# Patient Record
Sex: Female | Born: 1955
Health system: Southern US, Community
[De-identification: ages and names within clinical notes are randomized; demographics above are authoritative.]

## PROBLEM LIST (undated history)

## (undated) DIAGNOSIS — E119 Type 2 diabetes mellitus without complications: Secondary | ICD-10-CM

## (undated) DIAGNOSIS — I700 Atherosclerosis of aorta: Secondary | ICD-10-CM

## (undated) DIAGNOSIS — Z720 Tobacco use: Secondary | ICD-10-CM

## (undated) DIAGNOSIS — R002 Palpitations: Secondary | ICD-10-CM

## (undated) DIAGNOSIS — I10 Essential (primary) hypertension: Secondary | ICD-10-CM

## (undated) DIAGNOSIS — J9611 Chronic respiratory failure with hypoxia: Secondary | ICD-10-CM

## (undated) DIAGNOSIS — M4808 Spinal stenosis, sacral and sacrococcygeal region: Secondary | ICD-10-CM

## (undated) DIAGNOSIS — D649 Anemia, unspecified: Secondary | ICD-10-CM

## (undated) DIAGNOSIS — E559 Vitamin D deficiency, unspecified: Secondary | ICD-10-CM

## (undated) DIAGNOSIS — J449 Chronic obstructive pulmonary disease, unspecified: Secondary | ICD-10-CM

## (undated) DIAGNOSIS — Z794 Long term (current) use of insulin: Secondary | ICD-10-CM

## (undated) DIAGNOSIS — E785 Hyperlipidemia, unspecified: Secondary | ICD-10-CM

## (undated) DIAGNOSIS — E079 Disorder of thyroid, unspecified: Secondary | ICD-10-CM

## (undated) DIAGNOSIS — Z79891 Long term (current) use of opiate analgesic: Secondary | ICD-10-CM

## (undated) DIAGNOSIS — I5030 Unspecified diastolic (congestive) heart failure: Secondary | ICD-10-CM

## (undated) DIAGNOSIS — M4807 Spinal stenosis, lumbosacral region: Secondary | ICD-10-CM

## (undated) DIAGNOSIS — K219 Gastro-esophageal reflux disease without esophagitis: Secondary | ICD-10-CM

## (undated) DIAGNOSIS — M1990 Unspecified osteoarthritis, unspecified site: Secondary | ICD-10-CM

## (undated) DIAGNOSIS — E039 Hypothyroidism, unspecified: Secondary | ICD-10-CM

## (undated) DIAGNOSIS — M48061 Spinal stenosis, lumbar region without neurogenic claudication: Secondary | ICD-10-CM

## (undated) DIAGNOSIS — F419 Anxiety disorder, unspecified: Secondary | ICD-10-CM

## (undated) DIAGNOSIS — M791 Myalgia, unspecified site: Secondary | ICD-10-CM

## (undated) DIAGNOSIS — R202 Paresthesia of skin: Secondary | ICD-10-CM

## (undated) DIAGNOSIS — I509 Heart failure, unspecified: Secondary | ICD-10-CM

## (undated) DIAGNOSIS — E220 Acromegaly and pituitary gigantism: Secondary | ICD-10-CM

## (undated) DIAGNOSIS — T466X5A Adverse effect of antihyperlipidemic and antiarteriosclerotic drugs, initial encounter: Secondary | ICD-10-CM

## (undated) DIAGNOSIS — D352 Benign neoplasm of pituitary gland: Secondary | ICD-10-CM

## (undated) HISTORY — PX: CHOLECYSTECTOMY: SHX55

---

## 1978-11-15 HISTORY — PX: TIBIA FRACTURE SURGERY: SHX806

## 1981-11-15 HISTORY — PX: OVARIAN CYST SURGERY: SHX726

## 1987-11-16 HISTORY — PX: ABDOMINAL HYSTERECTOMY: SHX81

## 2000-11-15 HISTORY — PX: NECK SURGERY: SHX720

## 2001-06-26 ENCOUNTER — Ambulatory Visit (HOSPITAL_COMMUNITY): Admission: RE | Admit: 2001-06-26 | Discharge: 2001-06-26 | Payer: Self-pay | Admitting: Neurosurgery

## 2001-06-26 ENCOUNTER — Encounter: Payer: Self-pay | Admitting: Neurosurgery

## 2003-03-01 ENCOUNTER — Ambulatory Visit (HOSPITAL_COMMUNITY): Admission: RE | Admit: 2003-03-01 | Discharge: 2003-03-01 | Payer: Self-pay | Admitting: Neurosurgery

## 2012-10-30 DIAGNOSIS — M79671 Pain in right foot: Secondary | ICD-10-CM | POA: Insufficient documentation

## 2012-10-30 DIAGNOSIS — S9031XA Contusion of right foot, initial encounter: Secondary | ICD-10-CM | POA: Insufficient documentation

## 2013-01-10 DIAGNOSIS — J441 Chronic obstructive pulmonary disease with (acute) exacerbation: Secondary | ICD-10-CM | POA: Diagnosis present

## 2013-02-15 DIAGNOSIS — E079 Disorder of thyroid, unspecified: Secondary | ICD-10-CM

## 2013-02-15 DIAGNOSIS — K219 Gastro-esophageal reflux disease without esophagitis: Secondary | ICD-10-CM | POA: Insufficient documentation

## 2013-02-15 DIAGNOSIS — E039 Hypothyroidism, unspecified: Secondary | ICD-10-CM

## 2013-02-15 DIAGNOSIS — J309 Allergic rhinitis, unspecified: Secondary | ICD-10-CM | POA: Insufficient documentation

## 2013-02-15 DIAGNOSIS — R002 Palpitations: Secondary | ICD-10-CM | POA: Insufficient documentation

## 2013-03-20 DIAGNOSIS — D539 Nutritional anemia, unspecified: Secondary | ICD-10-CM | POA: Insufficient documentation

## 2013-03-20 DIAGNOSIS — M4800 Spinal stenosis, site unspecified: Secondary | ICD-10-CM | POA: Insufficient documentation

## 2013-03-27 DIAGNOSIS — D352 Benign neoplasm of pituitary gland: Secondary | ICD-10-CM | POA: Insufficient documentation

## 2013-03-27 DIAGNOSIS — E781 Pure hyperglyceridemia: Secondary | ICD-10-CM | POA: Insufficient documentation

## 2014-05-31 DIAGNOSIS — R110 Nausea: Secondary | ICD-10-CM | POA: Insufficient documentation

## 2015-12-10 ENCOUNTER — Emergency Department: Payer: Medicare Other

## 2015-12-10 ENCOUNTER — Encounter: Payer: Self-pay | Admitting: *Deleted

## 2015-12-10 ENCOUNTER — Inpatient Hospital Stay
Admission: EM | Admit: 2015-12-10 | Discharge: 2015-12-15 | DRG: 189 | Disposition: A | Discharge status: Home / Self Care | Payer: Medicare Other | Attending: Internal Medicine | Admitting: Internal Medicine

## 2015-12-10 DIAGNOSIS — M549 Dorsalgia, unspecified: Secondary | ICD-10-CM | POA: Diagnosis present

## 2015-12-10 DIAGNOSIS — Z888 Allergy status to other drugs, medicaments and biological substances status: Secondary | ICD-10-CM

## 2015-12-10 DIAGNOSIS — Z716 Tobacco abuse counseling: Secondary | ICD-10-CM

## 2015-12-10 DIAGNOSIS — E039 Hypothyroidism, unspecified: Secondary | ICD-10-CM | POA: Diagnosis present

## 2015-12-10 DIAGNOSIS — F17210 Nicotine dependence, cigarettes, uncomplicated: Secondary | ICD-10-CM | POA: Diagnosis present

## 2015-12-10 DIAGNOSIS — J9601 Acute respiratory failure with hypoxia: Secondary | ICD-10-CM | POA: Diagnosis present

## 2015-12-10 DIAGNOSIS — F329 Major depressive disorder, single episode, unspecified: Secondary | ICD-10-CM | POA: Diagnosis present

## 2015-12-10 DIAGNOSIS — R0602 Shortness of breath: Secondary | ICD-10-CM

## 2015-12-10 DIAGNOSIS — J441 Chronic obstructive pulmonary disease with (acute) exacerbation: Secondary | ICD-10-CM | POA: Diagnosis present

## 2015-12-10 DIAGNOSIS — I10 Essential (primary) hypertension: Secondary | ICD-10-CM | POA: Diagnosis present

## 2015-12-10 DIAGNOSIS — M4806 Spinal stenosis, lumbar region: Secondary | ICD-10-CM | POA: Diagnosis present

## 2015-12-10 DIAGNOSIS — G8929 Other chronic pain: Secondary | ICD-10-CM | POA: Diagnosis present

## 2015-12-10 HISTORY — DX: Spinal stenosis, sacral and sacrococcygeal region: M48.08

## 2015-12-10 HISTORY — DX: Chronic obstructive pulmonary disease, unspecified: J44.9

## 2015-12-10 HISTORY — DX: Disorder of thyroid, unspecified: E07.9

## 2015-12-10 HISTORY — DX: Tobacco use: Z72.0

## 2015-12-10 HISTORY — DX: Spinal stenosis, lumbar region without neurogenic claudication: M48.061

## 2015-12-10 LAB — BASIC METABOLIC PANEL
ANION GAP: 10 (ref 5–15)
BUN: 15 mg/dL (ref 6–20)
CHLORIDE: 101 mmol/L (ref 101–111)
CO2: 29 mmol/L (ref 22–32)
Calcium: 9.6 mg/dL (ref 8.9–10.3)
Creatinine, Ser: 0.96 mg/dL (ref 0.44–1.00)
GFR calc Af Amer: >60 mL/min (ref >60)
GFR calc non Af Amer: >60 mL/min (ref >60)
Glucose, Bld: 89 mg/dL (ref 65–99)
POTASSIUM: 4.2 mmol/L (ref 3.5–5.1)
SODIUM: 140 mmol/L (ref 135–145)

## 2015-12-10 LAB — CBC WITH DIFFERENTIAL/PLATELET
Basophils Absolute: 0.1 10*3/uL (ref 0–0.1)
Basophils Relative: 1 %
Eosinophils Absolute: 0.7 10*3/uL (ref 0–0.7)
Eosinophils Relative: 5 %
HCT: 44.1 % (ref 35.0–47.0)
Hemoglobin: 14.3 g/dL (ref 12.0–16.0)
Lymphocytes Relative: 25 %
Lymphs Abs: 3.1 10*3/uL (ref 1.0–3.6)
MCH: 27.4 pg (ref 26.0–34.0)
MCHC: 32.5 g/dL (ref 32.0–36.0)
MCV: 84.2 fL (ref 80.0–100.0)
Monocytes Absolute: 1 10*3/uL — ABNORMAL HIGH (ref 0.2–0.9)
Monocytes Relative: 8 %
Neutro Abs: 7.6 10*3/uL — ABNORMAL HIGH (ref 1.4–6.5)
Neutrophils Relative %: 61 %
Platelets: 299 10*3/uL (ref 150–440)
RBC: 5.24 MIL/uL — ABNORMAL HIGH (ref 3.80–5.20)
RDW: 14.9 % — ABNORMAL HIGH (ref 11.5–14.5)
WBC: 12.4 10*3/uL — ABNORMAL HIGH (ref 3.6–11.0)

## 2015-12-10 LAB — TROPONIN I

## 2015-12-10 MED ORDER — METHYLPREDNISOLONE SODIUM SUCC 125 MG IJ SOLR: 125.0000 mg | Freq: Once | INTRAMUSCULAR | Status: DC

## 2015-12-10 MED ORDER — ACETAMINOPHEN 650 MG RE SUPP: 650.0000 mg | Freq: Four times a day (QID) | RECTAL | Status: DC | PRN

## 2015-12-10 MED ORDER — HYDROCODONE-ACETAMINOPHEN 5-325 MG PO TABS
1.0000 | ORAL_TABLET | ORAL | Status: DC | PRN
  Administered 2015-12-11: 14:00:00 1 via ORAL
  Administered 2015-12-12: 2 via ORAL
  Administered 2015-12-12: 1 via ORAL
  Administered 2015-12-13 – 2015-12-15 (×5): 2 via ORAL
  Filled 2015-12-10 (×3): qty 2
  Filled 2015-12-10: qty 1
  Filled 2015-12-10 (×2): qty 2
  Filled 2015-12-10: qty 1
  Filled 2015-12-10: qty 2

## 2015-12-10 MED ORDER — OXYCODONE HCL 5 MG PO TABS
10.0000 mg | ORAL_TABLET | Freq: Once | ORAL | Status: AC
  Administered 2015-12-10: 10 mg via ORAL
  Filled 2015-12-10: qty 2

## 2015-12-10 MED ORDER — POLYETHYLENE GLYCOL 3350 17 G PO PACK: 17.0000 g | PACK | Freq: Every day | ORAL | Status: DC | PRN

## 2015-12-10 MED ORDER — METOPROLOL SUCCINATE ER 25 MG PO TB24
25.0000 mg | ORAL_TABLET | Freq: Every day | ORAL | Status: DC
  Administered 2015-12-10 – 2015-12-14 (×5): 25 mg via ORAL
  Filled 2015-12-10 (×5): qty 1

## 2015-12-10 MED ORDER — METHYLPREDNISOLONE SODIUM SUCC 125 MG IJ SOLR
60.0000 mg | INTRAMUSCULAR | Status: DC
  Administered 2015-12-11 (×2): 60 mg via INTRAVENOUS
  Filled 2015-12-10 (×2): qty 2

## 2015-12-10 MED ORDER — IPRATROPIUM-ALBUTEROL 0.5-2.5 (3) MG/3ML IN SOLN
RESPIRATORY_TRACT | Status: AC
  Administered 2015-12-10: 3 mL via RESPIRATORY_TRACT
  Filled 2015-12-10: qty 6

## 2015-12-10 MED ORDER — ONDANSETRON HCL 4 MG PO TABS: 4.0000 mg | ORAL_TABLET | Freq: Four times a day (QID) | ORAL | Status: DC | PRN

## 2015-12-10 MED ORDER — ACETAMINOPHEN 325 MG PO TABS
650.0000 mg | ORAL_TABLET | Freq: Four times a day (QID) | ORAL | Status: DC | PRN
Start: 2015-12-10 — End: 2015-12-15
  Administered 2015-12-10 – 2015-12-12 (×2): 650 mg via ORAL
  Filled 2015-12-10 (×2): qty 2

## 2015-12-10 MED ORDER — LEVOTHYROXINE SODIUM 175 MCG PO TABS
175.0000 ug | ORAL_TABLET | Freq: Every day | ORAL | Status: DC
  Administered 2015-12-11 – 2015-12-15 (×5): 175 ug via ORAL
  Filled 2015-12-10 (×6): qty 1

## 2015-12-10 MED ORDER — SODIUM CHLORIDE 0.9% FLUSH
3.0000 mL | Freq: Two times a day (BID) | INTRAVENOUS | Status: DC
  Administered 2015-12-10 – 2015-12-15 (×10): 3 mL via INTRAVENOUS

## 2015-12-10 MED ORDER — GABAPENTIN 600 MG PO TABS
600.0000 mg | ORAL_TABLET | Freq: Three times a day (TID) | ORAL | Status: DC
  Administered 2015-12-10 – 2015-12-15 (×14): 600 mg via ORAL
  Filled 2015-12-10 (×17): qty 1

## 2015-12-10 MED ORDER — SODIUM CHLORIDE 0.9% FLUSH: 3.0000 mL | INTRAVENOUS | Status: DC | PRN

## 2015-12-10 MED ORDER — IPRATROPIUM-ALBUTEROL 0.5-2.5 (3) MG/3ML IN SOLN
3.0000 mL | Freq: Once | RESPIRATORY_TRACT | Status: AC
  Administered 2015-12-10: 3 mL via RESPIRATORY_TRACT

## 2015-12-10 MED ORDER — IPRATROPIUM-ALBUTEROL 0.5-2.5 (3) MG/3ML IN SOLN
3.0000 mL | RESPIRATORY_TRACT | Status: DC | PRN
  Administered 2015-12-10 – 2015-12-15 (×17): 3 mL via RESPIRATORY_TRACT
  Filled 2015-12-10 (×16): qty 3

## 2015-12-10 MED ORDER — ENOXAPARIN SODIUM 40 MG/0.4ML ~~LOC~~ SOLN
40.0000 mg | SUBCUTANEOUS | Status: DC
  Administered 2015-12-10 – 2015-12-14 (×5): 40 mg via SUBCUTANEOUS
  Filled 2015-12-10 (×5): qty 0.4

## 2015-12-10 MED ORDER — AZITHROMYCIN 500 MG IV SOLR
500.0000 mg | Freq: Once | INTRAVENOUS | Status: AC
  Administered 2015-12-10: 500 mg via INTRAVENOUS
  Filled 2015-12-10: qty 500

## 2015-12-10 MED ORDER — AZITHROMYCIN 250 MG PO TABS
500.0000 mg | ORAL_TABLET | Freq: Every day | ORAL | Status: DC
  Administered 2015-12-11 – 2015-12-15 (×5): 500 mg via ORAL
  Filled 2015-12-10 (×5): qty 2

## 2015-12-10 MED ORDER — MOMETASONE FURO-FORMOTEROL FUM 100-5 MCG/ACT IN AERO
2.0000 | INHALATION_SPRAY | Freq: Two times a day (BID) | RESPIRATORY_TRACT | Status: DC
  Administered 2015-12-10 – 2015-12-15 (×10): 2 via RESPIRATORY_TRACT
  Filled 2015-12-10: qty 8.8

## 2015-12-10 MED ORDER — SODIUM CHLORIDE 0.9 % IV SOLN: 250.0000 mL | INTRAVENOUS | Status: DC | PRN

## 2015-12-10 MED ORDER — BISACODYL 5 MG PO TBEC
5.0000 mg | DELAYED_RELEASE_TABLET | Freq: Every day | ORAL | Status: DC | PRN
  Administered 2015-12-13: 13:00:00 5 mg via ORAL
  Filled 2015-12-10: qty 1

## 2015-12-10 MED ORDER — ONDANSETRON HCL 4 MG/2ML IJ SOLN: 4.0000 mg | Freq: Four times a day (QID) | INTRAMUSCULAR | Status: DC | PRN

## 2015-12-10 NOTE — ED Notes (Signed)
Patient up to bathroom within her room. (+) DOE noted. Patient returned to bed and supplemental oxygen replaced. Patient guided through deep breathing and breathing through her nose. Patient pending transfer to 1C at this time.

## 2015-12-10 NOTE — ED Notes (Signed)
Pt arrived to ED via EMS from home after reporting SOB that began last night. Pt has hx of COPD and reports this feels similar to COPD exacerbations in the past. Pt has audible wheezing at time of arrival and a dry cough present. EMS administered 125 Solu-Medrol and gave pt 1 duoneb treatment.

## 2015-12-10 NOTE — H&P (Addendum)
[place] at [place] NAME: Alison Taylor    MR#:  [id number]  DATE OF BIRTH:  30-Jul-1956  DATE OF ADMISSION:  12/10/2015  PRIMARY CARE PHYSICIAN: No primary care provider on file.   REQUESTING/REFERRING PHYSICIAN: Dr. Edd Fabian  CHIEF COMPLAINT:   Chief Complaint  Patient presents with  . Shortness of Breath    HISTORY OF PRESENT ILLNESS:  Alison Taylor  is a 60 y.o. female with a known history of COPD, hypothyroidism here with SOB/wheezing/cough. Multiple nebs and steroids tried but still hypoxic at 875 on RA and wheezing. Clear sputum. No edema. Continues to smoke. Afebrile. CXR showed no PNA. Last hospital admission for COPD was Summer 2016. Has chronic back pain.  PAST MEDICAL HISTORY:   Past Medical History  Diagnosis Date  . COPD (chronic obstructive pulmonary disease) ([place])   . Lumbar stenosis   . Spinal stenosis of sacral region   . Thyroid disease     hypo  . Tobacco abuse     PAST SURGICAL HISTORY:  History reviewed. No pertinent past surgical history.  SOCIAL HISTORY:   Social History  Substance Use Topics  . Smoking status: Current Every Day Smoker -- 0.50 packs/day    Types: Cigarettes  . Smokeless tobacco: Not on file  . Alcohol Use: No    FAMILY HISTORY:   Family History  Problem Relation Age of Onset  . ALS Brother     DRUG ALLERGIES:   Allergies  Allergen Reactions  . Keflet [Cephalexin] Hives    REVIEW OF SYSTEMS:   Review of Systems  Constitutional: Positive for malaise/fatigue. Negative for fever, chills and weight loss.  HENT: Negative for hearing loss and nosebleeds.   Eyes: Negative for blurred vision, double vision and pain.  Respiratory: Positive for cough, sputum production, shortness of breath and wheezing. Negative for hemoptysis.   Cardiovascular: Negative for chest pain, palpitations, orthopnea and leg swelling.  Gastrointestinal: Positive for nausea. Negative for  vomiting, abdominal pain, diarrhea and constipation.  Genitourinary: Negative for dysuria and hematuria.  Musculoskeletal: Positive for back pain and neck pain. Negative for myalgias and falls.  Skin: Negative for rash.  Neurological: Positive for weakness. Negative for dizziness, tremors, sensory change, speech change, focal weakness, seizures and headaches.  Endo/Heme/Allergies: Does not bruise/bleed easily.  Psychiatric/Behavioral: Negative for depression and memory loss. The patient is not nervous/anxious.      MEDICATIONS AT HOME:   Prior to Admission medications   Not on File    VITAL SIGNS:  Blood pressure 125/85, pulse 92, resp. rate 21, height 5\' 11"  (1.803 m), weight 88.451 kg (195 lb), last menstrual period 12/10/1987, SpO2 87 %.  PHYSICAL EXAMINATION:  Physical Exam  GENERAL:  60 y.o.-year-old patient lying in the bed with  Acute resp distress.  EYES: Pupils equal, round, reactive to light and accommodation. No scleral icterus. Extraocular muscles intact.  HEENT: Head atraumatic, normocephalic. Oropharynx and nasopharynx clear. No oropharyngeal erythema, moist oral mucosa  NECK:  Supple, no jugular venous distention. No thyroid enlargement, no tenderness.  LUNGS: Increased work of breathing. Bilateral wheezing. Poor air entry CARDIOVASCULAR: S1, S2 normal. No murmurs, rubs, or gallops.  ABDOMEN: Soft, nontender, nondistended. Bowel sounds present. No organomegaly or mass.  EXTREMITIES: No pedal edema, cyanosis, or clubbing. + 2 pedal & radial pulses b/l.   NEUROLOGIC: Cranial nerves II through XII are intact. No focal Motor or sensory deficits appreciated b/l PSYCHIATRIC: The patient is alert and oriented x  3. Good affect.  SKIN: No obvious rash, lesion, or ulcer.   LABORATORY PANEL:   CBC  Recent Labs Lab 12/10/15 1627  WBC 12.4*  HGB 14.3  HCT 44.1  PLT 299    ------------------------------------------------------------------------------------------------------------------  Chemistries   Recent Labs Lab 12/10/15 1627  NA 140  K 4.2  CL 101  CO2 29  GLUCOSE 89  BUN 15  CREATININE 0.96  CALCIUM 9.6   ------------------------------------------------------------------------------------------------------------------  Cardiac Enzymes  Recent Labs Lab 12/10/15 1627  TROPONINI <0.03   ------------------------------------------------------------------------------------------------------------------  RADIOLOGY:  Dg Chest 2 View  12/10/2015  CLINICAL DATA:  Onset of shortness of breath last night; history of COPD ; abnormal breath sounds of time of arrival, nonproductive cough. EXAM: CHEST  2 VIEW COMPARISON:  Report of a chest x-ray dated April 26, 2002 FINDINGS: The lungs are mildly hyperinflated. There is no focal infiltrate. There is no pleural effusion. The heart and pulmonary vascularity are normal. The mediastinum is normal in width. There is no pleural effusion. The bony thorax exhibits no acute abnormality. IMPRESSION: Hyperinflation consistent with COPD. There is no evidence of pneumonia, pulmonary edema, nor other acute cardiopulmonary abnormality. Electronically Signed   By: David  Martinique M.D.   On: 12/10/2015 17:21     IMPRESSION AND PLAN:   * COPD exacerbation with acute resp failure, hypoxic -IV steroids, Antibiotics - Scheduled Nebulizers - Inhalers -Wean O2 as tolerated - Consult pulmonary if no improvement  * Hypothyroidism Continue home meds  * Chronic back pain Neurontin  * Tobacco abuse Counseled > 3 min  * DVT prophylaxis with lovenox   All the records are reviewed and case discussed with ED provider. Management plans discussed with the patient, family and they are in agreement.  CODE STATUS: FULL  TOTAL TIME TAKING CARE OF THIS PATIENT: 40 minutes.    Hillary Bow R M.D on 12/10/2015 at 6:55  PM  Between 7am to 6pm - Pager - [phone]  After 6pm go to [email or web address] - password [email or web address] [facility]  [place] [facility]  Office  [phone]  CC: Primary care physician; No primary care provider on file.     Note: This dictation was prepared with Dragon dictation along with smaller phrase technology. Any transcriptional errors that result from this process are unintentional.

## 2015-12-10 NOTE — ED Provider Notes (Signed)
[facility] Emergency Department Provider Note  ____________________________________________  Time seen: Approximately 4:33 PM  I have reviewed the triage vital signs and the nursing notes.   HISTORY  Chief Complaint Shortness of Breath    HPI Alison Taylor is a 60 y.o. female history of COPD and spinal stenosis as well as thyroid disease who presents for evaluation of one day shortness of breath, gradual onset, constant since onset, currently severe. She reports it feels like her usual COPD exacerbation. She used her inhalers without any improvement of her symptoms. She is also having chest tightness. She is complaining of pain in her back which she reports feels separate from her chest pain. She has had cough which has not been productive. No fevers or chills. No vomiting or diarrhea. On EMS arrival, she received a DuoNeb treatment as well as 125 mg of Solu-Medrol.   Past Medical History  Diagnosis Date  . COPD (chronic obstructive pulmonary disease) ([place])   . Lumbar stenosis   . Spinal stenosis of sacral region   . Thyroid disease     hypo    There are no active problems to display for this patient.   History reviewed. No pertinent past surgical history.  No current outpatient prescriptions on file.  Allergies Keflet  History reviewed. No pertinent family history.  Social History Social History  Substance Use Topics  . Smoking status: Current Every Day Smoker -- 0.50 packs/day    Types: Cigarettes  . Smokeless tobacco: None  . Alcohol Use: No    Review of Systems Constitutional: No fever/chills Eyes: No visual changes. ENT: No sore throat. Cardiovascular: +chest pain. Respiratory: + shortness of breath. Gastrointestinal: No abdominal pain.  No nausea, no vomiting.  No diarrhea.  No constipation. Genitourinary: Negative for dysuria. Musculoskeletal: Positive for back pain. Skin: Negative for rash. Neurological: Negative for  headaches, focal weakness or numbness.  10-point ROS otherwise negative.  ____________________________________________   PHYSICAL EXAM:  VITAL SIGNS: ED Triage Vitals  Enc Vitals Group     BP 12/10/15 1621 124/58 mmHg     Pulse Rate 12/10/15 1621 97     Resp 12/10/15 1621 21     Temp --      Temp Source 12/10/15 1621 Oral     SpO2 12/10/15 1618 92 %     Weight 12/10/15 1621 195 lb (88.451 kg)     Height 12/10/15 1621 5\' 11"  (1.803 m)     Head Cir --      Peak Flow --      Pain Score 12/10/15 1622 8     Pain Loc --      Pain Edu? --      Excl. in [place]? --     Constitutional: Alert and oriented. In mild respiratory distress Eyes: Conjunctivae are normal. PERRL. EOMI. Head: Atraumatic. Nose: No congestion/rhinnorhea. Mouth/Throat: Mucous membranes are moist.  Oropharynx non-erythematous. Neck: No stridor.  Cardiovascular: Normal rate, regular rhythm. Grossly normal heart sounds.  Good peripheral circulation. Respiratory: Mild tachypnea with increased work of breathing. Poor air movement bilaterally. Mild expiratory wheeze. Gastrointestinal: Soft and nontender. No distention.  No CVA tenderness. Genitourinary: deferred Musculoskeletal: No lower extremity tenderness nor edema.  No joint effusions. Neurologic:  Normal speech and language. No gross focal neurologic deficits are appreciated. No gait instability. Skin:  Skin is warm, dry and intact. No rash noted. Psychiatric: Mood and affect are normal. Speech and behavior are normal.  ____________________________________________   LABS (all labs  ordered are listed, but only abnormal results are displayed)  Labs Reviewed  CBC WITH DIFFERENTIAL/PLATELET - Abnormal; Notable for the following:    WBC 12.4 (*)    RBC 5.24 (*)    RDW 14.9 (*)    Neutro Abs 7.6 (*)    Monocytes Absolute 1.0 (*)    All other components within normal limits  CULTURE, BLOOD (ROUTINE X 2)  CULTURE, BLOOD (ROUTINE X 2)  BASIC METABOLIC PANEL   TROPONIN I   ____________________________________________  EKG  ED ECG REPORT I, Joanne Gavel, the attending physician, personally viewed and interpreted this ECG.   Date: 12/10/2015  EKG Time: 16:25  Rate: 90  Rhythm: normal sinus rhythm  Axis: normal  Intervals:left anterior fascicular block. Short PR interval.  ST&T Change: No acute ST elevation.  ____________________________________________  RADIOLOGY  CXR IMPRESSION: Hyperinflation consistent with COPD. There is no evidence of pneumonia, pulmonary edema, nor other acute cardiopulmonary abnormality.   ____________________________________________   PROCEDURES  Procedure(s) performed: None  Critical Care performed: No  ____________________________________________   INITIAL IMPRESSION / ASSESSMENT AND PLAN / ED COURSE  Pertinent labs & imaging results that were available during my care of the patient were reviewed by me and considered in my medical decision making (see chart for details).  Alison Taylor is a 60 y.o. female history of COPD and spinal stenosis as well as thyroid disease who presents for evaluation of one day shortness of breath. On arrival to the emergency department, she is in mild respiratory distress with tachypnea, poor air movement, faint wheeze suspicious for COPD exacerbation. Plan for screening labs, troponin chest x-ray, will give DuoNeb treatments and reassess for disposition.  ----------------------------------------- 6:29 PM on 12/10/2015 ----------------------------------------- Abs reviewed. CBC notable for mild leukocytosis with white blood cell count 12.4. Unremarkable BMP, negative troponin. Chest x-ray shows findings consistent with COPD. This point, despite multiple nebulization treatments and steroids, the patient continues to have increased work of breathing and tachypnea with mild improvement in air movement, O2 saturation is 88% on room air, improves with supplemental  oxygen. Case discussed with the hospitalist, Dr. Darvin Neighbours, for admission.  ____________________________________________   FINAL CLINICAL IMPRESSION(S) / ED DIAGNOSES  Final diagnoses:  SOB (shortness of breath)  Chronic obstructive pulmonary disease with acute exacerbation ([place])      Joanne Gavel, MD 12/10/15 [phone]

## 2015-12-11 LAB — BASIC METABOLIC PANEL
ANION GAP: 8 (ref 5–15)
BUN: 16 mg/dL (ref 6–20)
CALCIUM: 9.2 mg/dL (ref 8.9–10.3)
CO2: 25 mmol/L (ref 22–32)
Chloride: 108 mmol/L (ref 101–111)
Creatinine, Ser: 0.72 mg/dL (ref 0.44–1.00)
GFR calc Af Amer: >60 mL/min (ref >60)
Glucose, Bld: 170 mg/dL — ABNORMAL HIGH (ref 65–99)
POTASSIUM: 4.6 mmol/L (ref 3.5–5.1)
SODIUM: 141 mmol/L (ref 135–145)

## 2015-12-11 LAB — CBC
HEMATOCRIT: 42 % (ref 35.0–47.0)
HEMOGLOBIN: 14 g/dL (ref 12.0–16.0)
MCH: 28.3 pg (ref 26.0–34.0)
MCHC: 33.3 g/dL (ref 32.0–36.0)
MCV: 84.9 fL (ref 80.0–100.0)
Platelets: 268 10*3/uL (ref 150–440)
RBC: 4.95 MIL/uL (ref 3.80–5.20)
RDW: 15 % — AB (ref 11.5–14.5)
WBC: 14.2 10*3/uL — AB (ref 3.6–11.0)

## 2015-12-11 MED ORDER — VENLAFAXINE HCL ER 75 MG PO CP24
75.0000 mg | ORAL_CAPSULE | Freq: Every day | ORAL | Status: DC
  Administered 2015-12-11 – 2015-12-15 (×5): 75 mg via ORAL
  Filled 2015-12-11 (×5): qty 1

## 2015-12-11 MED ORDER — NICOTINE 14 MG/24HR TD PT24
14.0000 mg | MEDICATED_PATCH | Freq: Every day | TRANSDERMAL | Status: DC
  Administered 2015-12-11 – 2015-12-15 (×5): 14 mg via TRANSDERMAL
  Filled 2015-12-11 (×5): qty 1

## 2015-12-11 NOTE — Care Management (Signed)
Admitted to [facility] with the diagnosis of COPD. Lives alone. Just moved to [place] from [place], [place]. Last seen Dr. Nena Alexander at [facility] October 13th 2016.  Home Health services last summer. Doesn't remember name of agency. No skilled facility. No home oxygen. Uses no aids for ambulation. Needs help sometimes with dressing. Self bath and feds. Fell last week while sitting on commode (fell asleep on commode). Good appetite usually. Family/friend will transport Daughter is Alison Taylor [phone]). Daughter lives in [place]. Alison Taylor is the closest relative.  Gave information on physicians accepting new patients. Discussed that she would need to call these offices. Also, give information on [facility]. States she currently gets her medications at [facility] in [place]. Discussed that she would need to call the Department of Social Services to see if she eligible for Medicaid Shelbie Ammons RN MSN CCM Care Management [phone]

## 2015-12-11 NOTE — Progress Notes (Signed)
[place] at [place] NAME: Katheen Taylor    MR#:  [id number]  DATE OF BIRTH:  [birth year]  SUBJECTIVE:seen at bedside,admitted for copd exacerbation. Still has lot of cough.  CHIEF COMPLAINT:   Chief Complaint  Patient presents with  . Shortness of Breath    REVIEW OF SYSTEMS:   ROS CONSTITUTIONAL: No fever, fatigue or weakness.  EYES: No blurred or double vision.  EARS, NOSE, AND THROAT: No tinnitus or ear pain.  RESPIRATORY: Shortness of breath, cough. CARDIOVASCULAR: No chest pain, orthopnea, edema.  GASTROINTESTINAL: No nausea, vomiting, diarrhea or abdominal pain.  GENITOURINARY: No dysuria, hematuria.  ENDOCRINE: No polyuria, nocturia,  HEMATOLOGY: No anemia, easy bruising or bleeding SKIN: No rash or lesion. MUSCULOSKELETAL: No joint pain or arthritis.   NEUROLOGIC: No tingling, numbness, weakness.  PSYCHIATRY: No anxiety or depression.   DRUG ALLERGIES:   Allergies  Allergen Reactions  . Keflex [Cephalexin] Hives    VITALS:  Blood pressure 118/56, pulse 78, temperature 97.7 F (36.5 C), temperature source Oral, resp. rate 19, height 5\' 11"  (1.803 m), weight 90.493 kg (199 lb 8 oz), last menstrual period 12/10/1987, SpO2 92 %.  PHYSICAL EXAMINATION:  GENERAL:  60 y.o.-year-old patient lying in the bed with no acute distress.  EYES: Pupils equal, round, reactive to light and accommodation. No scleral icterus. Extraocular muscles intact.  HEENT: Head atraumatic, normocephalic. Oropharynx and nasopharynx clear.  NECK:  Supple, no jugular venous distention. No thyroid enlargement, no tenderness.  LUNGS:. faint expiratory wheezing in both lung fields., rales,rhonchi or crepitation. No use of accessory muscles of respiration.  CARDIOVASCULAR: S1, S2 normal. No murmurs, rubs, or gallops.  ABDOMEN: Soft, nontender, nondistended. Bowel sounds present. No organomegaly or mass.  EXTREMITIES: No pedal edema, cyanosis, or  clubbing.  NEUROLOGIC: Cranial nerves II through XII are intact. Muscle strength 5/5 in all extremities. Sensation intact. Gait not checked.  PSYCHIATRIC: The patient is alert and oriented x 3.  SKIN: No obvious rash, lesion, or ulcer.    LABORATORY PANEL:   CBC  Recent Labs Lab 12/11/15 0544  WBC 14.2*  HGB 14.0  HCT 42.0  PLT 268   ------------------------------------------------------------------------------------------------------------------  Chemistries   Recent Labs Lab 12/11/15 0544  NA 141  K 4.6  CL 108  CO2 25  GLUCOSE 170*  BUN 16  CREATININE 0.72  CALCIUM 9.2   ------------------------------------------------------------------------------------------------------------------  Cardiac Enzymes  Recent Labs Lab 12/10/15 1627  TROPONINI <0.03   ------------------------------------------------------------------------------------------------------------------  RADIOLOGY:  Dg Chest 2 View  12/10/2015  CLINICAL DATA:  Onset of shortness of breath last night; history of COPD ; abnormal breath sounds of time of arrival, nonproductive cough. EXAM: CHEST  2 VIEW COMPARISON:  Report of a chest x-ray dated April 26, 2002 FINDINGS: The lungs are mildly hyperinflated. There is no focal infiltrate. There is no pleural effusion. The heart and pulmonary vascularity are normal. The mediastinum is normal in width. There is no pleural effusion. The bony thorax exhibits no acute abnormality. IMPRESSION: Hyperinflation consistent with COPD. There is no evidence of pneumonia, pulmonary edema, nor other acute cardiopulmonary abnormality. Electronically Signed   By: David  Martinique M.D.   On: 12/10/2015 17:21    EKG:  No orders found for this or any previous visit.  ASSESSMENT AND PLAN:   #1 . Respiratory failure with hypoxia secondary to COPD exacerbation: Improved slightly continue IV steroids, IV antibiotics, nebulizers. And continue oxygen. Evaluate for home oxygen  need. #2/hypothyroidism continue Synthroid. #  3/ history of tobacco abuse: Started the nicotine patch: #4. depression: Patient takes Effexor XR we will restart that. #GI, DVT prophylaxis. For hypertension continue metoprolol: Controlled.  All the records are reviewed and case discussed with Care Management/Social Workerr. Management plans discussed with the patient, family and they are in agreement.  CODE STATUS: Full  TOTAL TIME TAKING CARE OF THIS PATIENT:  35 minutes.   POSSIBLE D/C IN 1-2DAYS, DEPENDING ON CLINICAL CONDITION.   Epifanio Lesches M.D on 12/11/2015 at 9:57 AM  Between 7am to 6pm - Pager - [phone]  After 6pm go to [email or web address] - password [email or web address] [facility]  [place] [facility]  Office  [phone]  CC: Primary care physician; No primary care provider on file.   Note: This dictation was prepared with Dragon dictation along with smaller phrase technology. Any transcriptional errors that result from this process are unintentional.

## 2015-12-12 LAB — GLUCOSE, CAPILLARY: GLUCOSE-CAPILLARY: 168 mg/dL — AB (ref 65–99)

## 2015-12-12 MED ORDER — HYDROCOD POLST-CPM POLST ER 10-8 MG/5ML PO SUER
5.0000 mL | Freq: Two times a day (BID) | ORAL | Status: DC
  Administered 2015-12-12 – 2015-12-15 (×7): 5 mL via ORAL
  Filled 2015-12-12 (×7): qty 5

## 2015-12-12 MED ORDER — TIOTROPIUM BROMIDE MONOHYDRATE 18 MCG IN CAPS
18.0000 ug | ORAL_CAPSULE | Freq: Every day | RESPIRATORY_TRACT | Status: DC
  Administered 2015-12-12 – 2015-12-15 (×4): 18 ug via RESPIRATORY_TRACT
  Filled 2015-12-12: qty 5

## 2015-12-12 MED ORDER — CALCIUM CARBONATE ANTACID 500 MG PO CHEW: 1.0000 | CHEWABLE_TABLET | Freq: Three times a day (TID) | ORAL | Status: DC

## 2015-12-12 MED ORDER — GUAIFENESIN ER 600 MG PO TB12
600.0000 mg | ORAL_TABLET | Freq: Two times a day (BID) | ORAL | Status: DC
  Administered 2015-12-12 – 2015-12-15 (×7): 600 mg via ORAL
  Filled 2015-12-12 (×7): qty 1

## 2015-12-12 MED ORDER — PREDNISONE 50 MG PO TABS
60.0000 mg | ORAL_TABLET | Freq: Every day | ORAL | Status: DC
  Administered 2015-12-13 – 2015-12-15 (×3): 60 mg via ORAL
  Filled 2015-12-12 (×3): qty 1

## 2015-12-12 MED ORDER — CALCIUM CARBONATE ANTACID 500 MG PO CHEW
1.0000 | CHEWABLE_TABLET | Freq: Three times a day (TID) | ORAL | Status: DC | PRN
  Administered 2015-12-12 – 2015-12-13 (×3): 200 mg via ORAL
  Filled 2015-12-12 (×3): qty 1

## 2015-12-12 MED ORDER — PREDNISONE 50 MG PO TABS
60.0000 mg | ORAL_TABLET | Freq: Once | ORAL | Status: AC
  Administered 2015-12-12: 60 mg via ORAL
  Filled 2015-12-12: qty 1

## 2015-12-12 NOTE — Plan of Care (Signed)
Problem: Discharge Progression Outcomes Goal: O2 sats > or equal 90% or at baseline Outcome: Progressing Oxygen saturations 95% on 1 liter.

## 2015-12-12 NOTE — Progress Notes (Signed)
[place] at [place] NAME: Alison Taylor    MR#:  [id number]  DATE OF BIRTH:  February 05, 1956  SUBJECTIVE:seen at bedside,admitted for copd exacerbation. Still has lot of cough. unable get the phlegm out. Requesting Mucinex. Still hypoxic ,saturations 90% on 2 L.   CHIEF COMPLAINT:   Chief Complaint  Patient presents with  . Shortness of Breath    REVIEW OF SYSTEMS:   ROS CONSTITUTIONAL: No fever, fatigue or weakness.  EYES: No blurred or double vision.  EARS, NOSE, AND THROAT: No tinnitus or ear pain.  RESPIRATORY: Shortness of breath, cough. CARDIOVASCULAR: No chest pain, orthopnea, edema.  GASTROINTESTINAL: No nausea, vomiting, diarrhea or abdominal pain.  GENITOURINARY: No dysuria, hematuria.  ENDOCRINE: No polyuria, nocturia,  HEMATOLOGY: No anemia, easy bruising or bleeding SKIN: No rash or lesion. MUSCULOSKELETAL: No joint pain or arthritis.   NEUROLOGIC: No tingling, numbness, weakness.  PSYCHIATRY: No anxiety or depression.   DRUG ALLERGIES:   Allergies  Allergen Reactions  . Keflex [Cephalexin] Hives    VITALS:  Blood pressure 108/51, pulse 72, temperature 97.6 F (36.4 C), temperature source Oral, resp. rate 18, height 5\' 11"  (1.803 m), weight 89.994 kg (198 lb 6.4 oz), last menstrual period 12/10/1987, SpO2 93 %.  PHYSICAL EXAMINATION:  GENERAL:  60 y.o.-year-old patient lying in the bed with no acute distress.  EYES: Pupils equal, round, reactive to light and accommodation. No scleral icterus. Extraocular muscles intact.  HEENT: Head atraumatic, normocephalic. Oropharynx and nasopharynx clear.  NECK:  Supple, no jugular venous distention. No thyroid enlargement, no tenderness.  LUNGS:. Decreased  breath sounds bilaterally.,no  rales,rhonchi or crepitation. No use of accessory muscles of respiration.  CARDIOVASCULAR: S1, S2 normal. No murmurs, rubs, or gallops.  ABDOMEN: Soft, nontender, nondistended. Bowel  sounds present. No organomegaly or mass.  EXTREMITIES: No pedal edema, cyanosis, or clubbing.  NEUROLOGIC: Cranial nerves II through XII are intact. Muscle strength 5/5 in all extremities. Sensation intact. Gait not checked.  PSYCHIATRIC: The patient is alert and oriented x 3.  SKIN: No obvious rash, lesion, or ulcer.    LABORATORY PANEL:   CBC  Recent Labs Lab 12/11/15 0544  WBC 14.2*  HGB 14.0  HCT 42.0  PLT 268   ------------------------------------------------------------------------------------------------------------------  Chemistries   Recent Labs Lab 12/11/15 0544  NA 141  K 4.6  CL 108  CO2 25  GLUCOSE 170*  BUN 16  CREATININE 0.72  CALCIUM 9.2   ------------------------------------------------------------------------------------------------------------------  Cardiac Enzymes  Recent Labs Lab 12/10/15 1627  TROPONINI <0.03   ------------------------------------------------------------------------------------------------------------------  RADIOLOGY:  Dg Chest 2 View  12/10/2015  CLINICAL DATA:  Onset of shortness of breath last night; history of COPD ; abnormal breath sounds of time of arrival, nonproductive cough. EXAM: CHEST  2 VIEW COMPARISON:  Report of a chest x-ray dated April 26, 2002 FINDINGS: The lungs are mildly hyperinflated. There is no focal infiltrate. There is no pleural effusion. The heart and pulmonary vascularity are normal. The mediastinum is normal in width. There is no pleural effusion. The bony thorax exhibits no acute abnormality. IMPRESSION: Hyperinflation consistent with COPD. There is no evidence of pneumonia, pulmonary edema, nor other acute cardiopulmonary abnormality. Electronically Signed   By: David  Martinique M.D.   On: 12/10/2015 17:21    EKG:  No orders found for this or any previous visit.  ASSESSMENT AND PLAN:   #1 . Respiratory failure with hypoxia secondary to COPD exacerbation: ; Continue oxygen, nebulizers,  steroids.Marland Kitchen  Add Mucinex. Check oxygen saturation at rest and exertion on room air to see if she qualifies for home oxygen. #2/hypothyroidism continue Synthroid. #3/ history of tobacco abuse: Started the nicotine patch: #4. depression: Patient takes Effexor XR we will restart that. #GI, DVT prophylaxis. For hypertension continue metoprolol: Controlled.  All the records are reviewed and case discussed with Care Management/Social Workerr. Management plans discussed with the patient, family and they are in agreement.  CODE STATUS: Full  TOTAL TIME TAKING CARE OF THIS PATIENT:  35 minutes.   POSSIBLE D/C IN 1-2DAYS, DEPENDING ON CLINICAL CONDITION.   Epifanio Lesches M.D on 12/12/2015 at 8:36 AM  Between 7am to 6pm - Pager - [phone]  After 6pm go to [email or web address] - password [email or web address] [facility]  [place] [facility]  Office  [phone]  CC: Primary care physician; No primary care provider on file.   Note: This dictation was prepared with Dragon dictation along with smaller phrase technology. Any transcriptional errors that result from this process are unintentional.

## 2015-12-12 NOTE — Care Management Important Message (Signed)
Important Message  Patient Details  Name: Alison Taylor MRN: [id number] Date of Birth: 08-14-1956   Medicare Important Message Given:  Yes    [name] 12/12/2015, 9:50 AM

## 2015-12-13 MED ORDER — PANTOPRAZOLE SODIUM 40 MG PO TBEC
40.0000 mg | DELAYED_RELEASE_TABLET | Freq: Every day | ORAL | Status: DC
  Administered 2015-12-14 – 2015-12-15 (×2): 40 mg via ORAL
  Filled 2015-12-13 (×2): qty 1

## 2015-12-13 MED ORDER — PANTOPRAZOLE SODIUM 40 MG PO TBEC
40.0000 mg | DELAYED_RELEASE_TABLET | Freq: Once | ORAL | Status: AC
  Administered 2015-12-13: 40 mg via ORAL
  Filled 2015-12-13: qty 1

## 2015-12-13 NOTE — Progress Notes (Signed)
[place] at [place] NAME: Alison Taylor    MR#:  [id number]  DATE OF BIRTH:  [birth year]  SUBJECTIVE:seen at bedside,admitted for copd exacerbation. Patient says she feels slightly better than yesterday. Less  shortness of breath.   CHIEF COMPLAINT:   Chief Complaint  Patient presents with  . Shortness of Breath    REVIEW OF SYSTEMS:   ROS CONSTITUTIONAL: No fever, fatigue or weakness.  EYES: No blurred or double vision.  EARS, NOSE, AND THROAT: No tinnitus or ear pain.  RESPIRATORY: Shortness of breath, cough. CARDIOVASCULAR: No chest pain, orthopnea, edema.  GASTROINTESTINAL: No nausea, vomiting, diarrhea or abdominal pain.  GENITOURINARY: No dysuria, hematuria.  ENDOCRINE: No polyuria, nocturia,  HEMATOLOGY: No anemia, easy bruising or bleeding SKIN: No rash or lesion. MUSCULOSKELETAL: No joint pain or arthritis.   NEUROLOGIC: No tingling, numbness, weakness.  PSYCHIATRY: No anxiety or depression.   DRUG ALLERGIES:   Allergies  Allergen Reactions  . Keflex [Cephalexin] Hives    VITALS:  Blood pressure 141/69, pulse 66, temperature 97.7 F (36.5 C), temperature source Oral, resp. rate 20, height 5\' 11"  (1.803 m), weight 92.352 kg (203 lb 9.6 oz), last menstrual period 12/10/1987, SpO2 97 %.  PHYSICAL EXAMINATION:  GENERAL:  60 y.o.-year-old patient lying in the bed with no acute distress.  EYES: Pupils equal, round, reactive to light and accommodation. No scleral icterus. Extraocular muscles intact.  HEENT: Head atraumatic, normocephalic. Oropharynx and nasopharynx clear.  NECK:  Supple, no jugular venous distention. No thyroid enlargement, no tenderness.  LUNGS:. Decreased  breath sounds bilaterally.,no  rales,rhonchi or crepitation. No use of accessory muscles of respiration.  CARDIOVASCULAR: S1, S2 normal. No murmurs, rubs, or gallops.  ABDOMEN: Soft, nontender, nondistended. Bowel sounds present. No organomegaly  or mass.  EXTREMITIES: No pedal edema, cyanosis, or clubbing.  NEUROLOGIC: Cranial nerves II through XII are intact. Muscle strength 5/5 in all extremities. Sensation intact. Gait not checked.  PSYCHIATRIC: The patient is alert and oriented x 3.  SKIN: No obvious rash, lesion, or ulcer.    LABORATORY PANEL:   CBC  Recent Labs Lab 12/11/15 0544  WBC 14.2*  HGB 14.0  HCT 42.0  PLT 268   ------------------------------------------------------------------------------------------------------------------  Chemistries   Recent Labs Lab 12/11/15 0544  NA 141  K 4.6  CL 108  CO2 25  GLUCOSE 170*  BUN 16  CREATININE 0.72  CALCIUM 9.2   ------------------------------------------------------------------------------------------------------------------  Cardiac Enzymes  Recent Labs Lab 12/10/15 1627  TROPONINI <0.03   ------------------------------------------------------------------------------------------------------------------  RADIOLOGY:  No results found.  EKG:  No orders found for this or any previous visit.  ASSESSMENT AND PLAN:   #1 . Respiratory failure with hypoxia secondary to COPD exacerbation: ; Continue oxygen, nebulizers, steroids.. Add Mucinex. A discharge tomorrow with the by mouth steroids, empiric  antibiotics. #2/hypothyroidism continue Synthroid. #3/ history of tobacco abuse: Started the nicotine patch:, And Spiriva. #4. depression: Patient takes Effexor XR. #GI, DVT prophylaxis. For hypertension continue metoprolol: Controlled. likley d/c  am All the records are reviewed and case discussed with Care Management/Social Workerr. Management plans discussed with the patient, family and they are in agreement.  CODE STATUS: Full  TOTAL TIME TAKING CARE OF THIS PATIENT:  35 minutes.   POSSIBLE D/C IN 1-2DAYS, DEPENDING ON CLINICAL CONDITION.   Epifanio Lesches M.D on 12/13/2015 at 8:23 AM  Between 7am to 6pm - Pager - [phone]  After  6pm go to [email or web address] - password [email or web address]  [place] [facility]  Office  [phone]  CC: Primary care physician; No primary care provider on file.   Note: This dictation was prepared with Dragon dictation along with smaller phrase technology. Any transcriptional errors that result from this process are unintentional.

## 2015-12-13 NOTE — Plan of Care (Signed)
Pt w/COPD exacerbation - states she's feeling better.  Doesn't feel as SOB walking to BR. Desated to 88% but quickly recovered.  Not on home O2 - weaned her down to 1L.  Gave Norco 1x for  Back pain and cough syrup for congested cough.  Probable d/c home tomorrow on PO steroids and abx. Biscodyl for constipation and TUMs - heartburn.

## 2015-12-14 ENCOUNTER — Inpatient Hospital Stay: Payer: Medicare Other

## 2015-12-14 LAB — CBC
HCT: 41.1 % (ref 35.0–47.0)
HEMOGLOBIN: 13.7 g/dL (ref 12.0–16.0)
MCH: 28.2 pg (ref 26.0–34.0)
MCHC: 33.2 g/dL (ref 32.0–36.0)
MCV: 84.9 fL (ref 80.0–100.0)
Platelets: 282 10*3/uL (ref 150–440)
RBC: 4.85 MIL/uL (ref 3.80–5.20)
RDW: 15.2 % — ABNORMAL HIGH (ref 11.5–14.5)
WBC: 13.6 10*3/uL — AB (ref 3.6–11.0)

## 2015-12-14 LAB — CREATININE, SERUM
Creatinine, Ser: 0.98 mg/dL (ref 0.44–1.00)
GFR calc non Af Amer: >60 mL/min (ref >60)

## 2015-12-14 NOTE — Progress Notes (Signed)
[place] at [place] NAME: Alison Taylor    MR#:  [id number]  DATE OF BIRTH:  1956/05/13  SUBJECTIVE:seen at bedside,admitted for copd exacerbation. #Shortness of breath and the able to get the phlegm out. But still not ready for discharge. He states that  still has wheezing and shortness of breath on ambulation,  CHIEF COMPLAINT:   Chief Complaint  Patient presents with  . Shortness of Breath    REVIEW OF SYSTEMS:   ROS CONSTITUTIONAL: No fever, fatigue or weakness.  EYES: No blurred or double vision.  EARS, NOSE, AND THROAT: No tinnitus or ear pain.  RESPIRATORY: Shortness of breath, cough. CARDIOVASCULAR: No chest pain, orthopnea, edema.  GASTROINTESTINAL: No nausea, vomiting, diarrhea or abdominal pain.  GENITOURINARY: No dysuria, hematuria.  ENDOCRINE: No polyuria, nocturia,  HEMATOLOGY: No anemia, easy bruising or bleeding SKIN: No rash or lesion. MUSCULOSKELETAL: No joint pain or arthritis.   NEUROLOGIC: No tingling, numbness, weakness.  PSYCHIATRY: No anxiety or depression.   DRUG ALLERGIES:   Allergies  Allergen Reactions  . Keflex [Cephalexin] Hives    VITALS:  Blood pressure 122/65, pulse 68, temperature 97.7 F (36.5 C), temperature source Oral, resp. rate 22, height 5\' 11"  (1.803 m), weight 91.763 kg (202 lb 4.8 oz), last menstrual period 12/10/1987, SpO2 92 %.  PHYSICAL EXAMINATION:  GENERAL:  60 y.o.-year-old patient lying in the bed with no acute distress.  EYES: Pupils equal, round, reactive to light and accommodation. No scleral icterus. Extraocular muscles intact.  HEENT: Head atraumatic, normocephalic. Oropharynx and nasopharynx clear.  NECK:  Supple, no jugular venous distention. No thyroid enlargement, no tenderness.  LUNGS:. Decreased  breath sounds bilaterally.,no  rales,rhonchi or crepitation. No use of accessory muscles of respiration. Faint wheezing present on the  Right side CARDIOVASCULAR:  S1, S2 normal. No murmurs, rubs, or gallops.  ABDOMEN: Soft, nontender, nondistended. Bowel sounds present. No organomegaly or mass.  EXTREMITIES: No pedal edema, cyanosis, or clubbing.  NEUROLOGIC: Cranial nerves II through XII are intact. Muscle strength 5/5 in all extremities. Sensation intact. Gait not checked.  PSYCHIATRIC: The patient is alert and oriented x 3.  SKIN: No obvious rash, lesion, or ulcer.    LABORATORY PANEL:   CBC  Recent Labs Lab 12/14/15 0549  WBC 13.6*  HGB 13.7  HCT 41.1  PLT 282   ------------------------------------------------------------------------------------------------------------------  Chemistries   Recent Labs Lab 12/11/15 0544 12/14/15 0549  NA 141  --   K 4.6  --   CL 108  --   CO2 25  --   GLUCOSE 170*  --   BUN 16  --   CREATININE 0.72 0.98  CALCIUM 9.2  --    ------------------------------------------------------------------------------------------------------------------  Cardiac Enzymes  Recent Labs Lab 12/10/15 1627  TROPONINI <0.03   ------------------------------------------------------------------------------------------------------------------  RADIOLOGY:  No results found.  EKG:  No orders found for this or any previous visit.  ASSESSMENT AND PLAN:   #1 . Respiratory failure with hypoxia secondary to COPD exacerbation: ; Continue oxygen, nebulizers, steroids.. Added mucinex.pt still feels SOB and prefers one more day and possibly can go home tomorrow, #2/hypothyroidism continue Synthroid. #3/ history of tobacco abuse: Started the nicotine patch:, And Spiriva. #4. depression: Patient takes Effexor XR. #GI, DVT prophylaxis. For hypertension continue metoprolol: Controlled.  All the records are reviewed and case discussed with Care Management/Social Workerr. Management plans discussed with the patient, family and they are in agreement.  CODE STATUS: Full  TOTAL TIME TAKING CARE  OF THIS PATIENT:  35  minutes.   POSSIBLE D/C IN 1-2DAYS, DEPENDING ON CLINICAL CONDITION.   Epifanio Lesches M.D on 12/14/2015 at 10:56 AM  Between 7am to 6pm - Pager - [phone]  After 6pm go to [email or web address] - password [email or web address] [facility]  [place] [facility]  Office  [phone]  CC: Primary care physician; No primary care provider on file.   Note: This dictation was prepared with Dragon dictation along with smaller phrase technology. Any transcriptional errors that result from this process are unintentional.

## 2015-12-14 NOTE — Plan of Care (Signed)
Problem: Pain Managment: Goal: General experience of comfort will improve Outcome: Progressing Prn for pain with some relief. This is chronic pain for pt  Problem: Discharge Progression Outcomes Goal: Dyspnea controlled Outcome: Progressing Sob  Better . Pt still has some sob with  Exertion. 02 cont.   Possible  Discharge home tomorrow

## 2015-12-15 LAB — CULTURE, BLOOD (ROUTINE X 2)
CULTURE: NO GROWTH
Culture: NO GROWTH

## 2015-12-15 MED ORDER — VENLAFAXINE HCL 75 MG PO TABS: 75.0000 mg | ORAL_TABLET | Freq: Every day | ORAL | Status: DC

## 2015-12-15 MED ORDER — BENZONATATE 100 MG PO CAPS: 100.0000 mg | ORAL_CAPSULE | Freq: Three times a day (TID) | ORAL | Status: DC | PRN

## 2015-12-15 MED ORDER — ALBUTEROL SULFATE HFA 108 (90 BASE) MCG/ACT IN AERS: 2.0000 | INHALATION_SPRAY | Freq: Four times a day (QID) | RESPIRATORY_TRACT | Status: DC | PRN

## 2015-12-15 MED ORDER — PREDNISONE 10 MG PO TABS: ORAL_TABLET | ORAL | Status: DC

## 2015-12-15 MED ORDER — PREDNISONE 20 MG PO TABS: 40.0000 mg | ORAL_TABLET | Freq: Every day | ORAL | Status: DC

## 2015-12-15 MED ORDER — FLUTICASONE-SALMETEROL 250-50 MCG/DOSE IN AEPB: 1.0000 | INHALATION_SPRAY | Freq: Two times a day (BID) | RESPIRATORY_TRACT | Status: DC

## 2015-12-15 MED ORDER — IPRATROPIUM BROMIDE HFA 17 MCG/ACT IN AERS: 2.0000 | INHALATION_SPRAY | Freq: Four times a day (QID) | RESPIRATORY_TRACT | Status: DC | PRN

## 2015-12-15 NOTE — Discharge Summary (Signed)
[place] at [place] NAME: Alison Taylor    MR#:  [id number]  DATE OF BIRTH:  January 31, 1956  DATE OF ADMISSION:  12/10/2015 ADMITTING PHYSICIAN: Hillary Bow, MD  DATE OF DISCHARGE: 12/15/2015 12:19 PM  PRIMARY CARE PHYSICIAN: No primary care provider on file.    ADMISSION DIAGNOSIS:  SOB (shortness of breath) [R06.02] Chronic obstructive pulmonary disease with acute exacerbation (HCC) [J44.1]   DISCHARGE DIAGNOSIS:    SECONDARY DIAGNOSIS:   Past Medical History  Diagnosis Date  . COPD (chronic obstructive pulmonary disease) ([place])   . Lumbar stenosis   . Spinal stenosis of sacral region   . Thyroid disease     hypo  . Tobacco abuse     HOSPITAL COURSE:   #1 . Acute espiratory failure with hypoxia secondary to COPD exacerbation:  The patient was treated with oxygen, nebulizers, steroids,  Mucinex. Her symptoms has much improved. She is off oxygen. Continue to taper prednisone after discharge.  #2/hypothyroidism continue Synthroid. #3/ history of tobacco abuse: Started the nicotine patch:, And Spiriva. #4. depression: Patient takes Effexor XR. #GI, DVT prophylaxis.  DISCHARGE CONDITIONS:   Stable, discharged to home today.  CONSULTS OBTAINED:     DRUG ALLERGIES:   Allergies  Allergen Reactions  . Keflex [Cephalexin] Hives    DISCHARGE MEDICATIONS:   Discharge Medication List as of 12/15/2015 11:37 AM    START taking these medications   Details  benzonatate (TESSALON PERLES) 100 MG capsule Take 1 capsule (100 mg total) by mouth 3 (three) times daily as needed for cough., Starting 12/15/2015, Until Discontinued, Print    predniSONE (DELTASONE) 10 MG tablet 40 mg po daily for 2 days, 20 mg po daily for 2 days, 10 mg po daily for 3 days., Print      CONTINUE these medications which have CHANGED   Details  albuterol (PROVENTIL HFA;VENTOLIN HFA) 108 (90 Base) MCG/ACT inhaler Inhale 2 puffs into the lungs every 6  (six) hours as needed for wheezing or shortness of breath., Starting 12/15/2015, Until Discontinued, Print    Fluticasone-Salmeterol (ADVAIR) 250-50 MCG/DOSE AEPB Inhale 1 puff into the lungs 2 (two) times daily., Starting 12/15/2015, Until Discontinued, Print    ipratropium (ATROVENT HFA) 17 MCG/ACT inhaler Inhale 2 puffs into the lungs every 6 (six) hours as needed for wheezing., Starting 12/15/2015, Until Discontinued, Print      CONTINUE these medications which have NOT CHANGED   Details  b complex vitamins tablet Take 1 tablet by mouth daily., Until Discontinued, Historical Med    cholecalciferol (VITAMIN D) 1000 units tablet Take 2,000 Units by mouth daily., Until Discontinued, Historical Med    gabapentin (NEURONTIN) 600 MG tablet Take 600 mg by mouth 3 (three) times daily., Until Discontinued, Historical Med    HYDROcodone-acetaminophen (NORCO/VICODIN) 5-325 MG tablet Take 1 tablet by mouth every 6 (six) hours as needed for moderate pain., Until Discontinued, Historical Med    levothyroxine (SYNTHROID, LEVOTHROID) 175 MCG tablet Take 175 mcg by mouth daily before breakfast., Until Discontinued, Historical Med    metoprolol tartrate (LOPRESSOR) 25 MG tablet Take 25 mg by mouth 2 (two) times daily., Until Discontinued, Historical Med    Multiple Vitamins-Minerals ([place]) Take 2 each by mouth daily., Until Discontinued, Historical Med         DISCHARGE INSTRUCTIONS:    If you experience worsening of your admission symptoms, develop shortness of breath, life threatening emergency, suicidal or homicidal thoughts you  must seek medical attention immediately by calling 911 or calling your MD immediately  if symptoms less severe.  You Must read complete instructions/literature along with all the possible adverse reactions/side effects for all the Medicines you take and that have been prescribed to you. Take any new Medicines after you have completely understood and  accept all the possible adverse reactions/side effects.   Please note  You were cared for by a hospitalist during your hospital stay. If you have any questions about your discharge medications or the care you received while you were in the hospital after you are discharged, you can call the unit and asked to speak with the hospitalist on call if the hospitalist that took care of you is not available. Once you are discharged, your primary care physician will handle any further medical issues. Please note that NO REFILLS for any discharge medications will be authorized once you are discharged, as it is imperative that you return to your primary care physician (or establish a relationship with a primary care physician if you do not have one) for your aftercare needs so that they can reassess your need for medications and monitor your lab values.    Today   SUBJECTIVE   Feels much better, no shortness of breath. Off oxygen.   VITAL SIGNS:  Blood pressure 124/71, pulse 63, temperature 97.5 F (36.4 C), temperature source Oral, resp. rate 20, height 5\' 11"  (1.803 m), weight 90.538 kg (199 lb 9.6 oz), last menstrual period 12/10/1987, SpO2 98 %.  I/O:   Intake/Output Summary (Last 24 hours) at 12/15/15 1655 Last data filed at 12/15/15 0800  Gross per 24 hour  Intake    483 ml  Output      0 ml  Net    483 ml    PHYSICAL EXAMINATION:  GENERAL:  60 y.o.-year-old patient lying in the bed with no acute distress.  EYES: Pupils equal, round, reactive to light and accommodation. No scleral icterus. Extraocular muscles intact.  HEENT: Head atraumatic, normocephalic. Oropharynx and nasopharynx clear.  NECK:  Supple, no jugular venous distention. No thyroid enlargement, no tenderness.  LUNGS: Normal breath sounds bilaterally, no wheezing, rales,rhonchi or crepitation. No use of accessory muscles of respiration.  CARDIOVASCULAR: S1, S2 normal. No murmurs, rubs, or gallops.  ABDOMEN: Soft,  non-tender, non-distended. Bowel sounds present. No organomegaly or mass.  EXTREMITIES: No pedal edema, cyanosis, or clubbing.  NEUROLOGIC: Cranial nerves II through XII are intact. Muscle strength 5/5 in all extremities. Sensation intact. Gait not checked.  PSYCHIATRIC: The patient is alert and oriented x 3.  SKIN: No obvious rash, lesion, or ulcer.   DATA REVIEW:   CBC  Recent Labs Lab 12/14/15 0549  WBC 13.6*  HGB 13.7  HCT 41.1  PLT 282    Chemistries   Recent Labs Lab 12/11/15 0544 12/14/15 0549  NA 141  --   K 4.6  --   CL 108  --   CO2 25  --   GLUCOSE 170*  --   BUN 16  --   CREATININE 0.72 0.98  CALCIUM 9.2  --     Cardiac Enzymes  Recent Labs Lab 12/10/15 1627  TROPONINI <0.03    Microbiology Results  Results for orders placed or performed during the hospital encounter of 12/10/15  Blood culture (routine x 2)     Status: None   Collection Time: 12/10/15  6:37 PM  Result Value Ref Range Status   Specimen Description BLOOD LEFT ASSIST  CONTROL  Final   Special Requests BOTTLES DRAWN AEROBIC AND ANAEROBIC [place]  Final   Culture NO GROWTH 5 DAYS  Final   Report Status 12/15/2015 FINAL  Final  Blood culture (routine x 2)     Status: None   Collection Time: 12/10/15  6:37 PM  Result Value Ref Range Status   Specimen Description BLOOD RIGHT ASSIST CONTROL  Final   Special Requests   Final    BOTTLES DRAWN AEROBIC AND ANAEROBIC 10CC AERO [place] ANA   Culture NO GROWTH 5 DAYS  Final   Report Status 12/15/2015 FINAL  Final    RADIOLOGY:  Dg Chest 1 View  12/14/2015  CLINICAL DATA:  Known COPD.  Shortness of breath EXAM: CHEST 1 VIEW COMPARISON:  12/10/2015 FINDINGS: There is hyperinflation of the lungs compatible with COPD. Heart and mediastinal contours are within normal limits. No focal opacities or effusions. No acute bony abnormality. IMPRESSION: COPD.  No active disease. Electronically Signed   By: Rolm Baptise M.D.   On: 12/14/2015 11:20         Management plans discussed with the patient, family and they are in agreement.  CODE STATUS:  Code Status History    Date Active Date Inactive Code Status Order ID Comments User Context   12/10/2015  6:31 PM 12/15/2015  3:23 PM Full Code [id number]  Hillary Bow, MD ED      TOTAL TIME TAKING CARE OF THIS PATIENT: 36 minutes.    Demetrios Loll M.D on 12/15/2015 at 4:55 PM  Between 7am to 6pm - Pager - [phone]  After 6pm go to [email or web address] - password [email or web address] [facility]  [place] [facility]  Office  [phone]  CC: Primary care physician; No primary care provider on file.

## 2015-12-15 NOTE — Care Management Important Message (Signed)
Important Message  Patient Details  Name: Alison Taylor MRN: [id number] Date of Birth: 01-24-1956   Medicare Important Message Given:  Yes    [name] 12/15/2015, 11:23 AM

## 2015-12-15 NOTE — Discharge Instructions (Signed)
Heart healthy diet. Activity as tolerated. Smoking cessation. Chronic Obstructive Pulmonary Disease Exacerbation Chronic obstructive pulmonary disease (COPD) is a common lung problem. In COPD, the flow of air from the lungs is limited. COPD exacerbations are times that breathing gets worse and you need extra treatment. Without treatment they can be life threatening. If they happen often, your lungs can become more damaged. If your COPD gets worse, your doctor may treat you with:  Medicines.  Oxygen.  Different ways to clear your airway, such as using a mask. HOME CARE  Do not smoke.  Avoid tobacco smoke and other things that bother your lungs.  If given, take your antibiotic medicine as told. Finish the medicine even if you start to feel better.  Only take medicines as told by your doctor.  Drink enough fluids to keep your pee (urine) clear or pale yellow (unless your doctor has told you not to).  Use a cool mist machine (vaporizer).  If you use oxygen or a machine that turns liquid medicine into a mist (nebulizer), continue to use them as told.  Keep up with shots (vaccinations) as told by your doctor.  Exercise regularly.  Eat healthy foods.  Keep all doctor visits as told. GET HELP RIGHT AWAY IF:  You are very short of breath and it gets worse.  You have trouble talking.  You have bad chest pain.  You have blood in your spit (sputum).  You have a fever.  You keep throwing up (vomiting).  You feel weak, or you pass out (faint).  You feel confused.  You keep getting worse. MAKE SURE YOU:  Understand these instructions.  Will watch your condition.  Will get help right away if you are not doing well or get worse.   This information is not intended to replace advice given to you by your health care provider. Make sure you discuss any questions you have with your health care provider.   Document Released: 10/21/2011 Document Revised: 11/22/2014 Document  Reviewed: 07/06/2013 Elsevier Interactive Patient Education [employer].

## 2015-12-30 ENCOUNTER — Other Ambulatory Visit: Payer: Self-pay | Admitting: Nurse Practitioner

## 2015-12-30 DIAGNOSIS — Z1231 Encounter for screening mammogram for malignant neoplasm of breast: Secondary | ICD-10-CM

## 2016-01-22 ENCOUNTER — Ambulatory Visit
Admission: RE | Admit: 2016-01-22 | Discharge: 2016-01-22 | Disposition: A | Discharge status: Home / Self Care | Payer: Medicare Other | Source: Ambulatory Visit | Attending: Nurse Practitioner | Admitting: Nurse Practitioner

## 2016-01-22 DIAGNOSIS — Z1231 Encounter for screening mammogram for malignant neoplasm of breast: Secondary | ICD-10-CM | POA: Insufficient documentation

## 2016-04-12 ENCOUNTER — Emergency Department: Payer: Medicare Other

## 2016-04-12 ENCOUNTER — Emergency Department
Admission: EM | Admit: 2016-04-12 | Discharge: 2016-04-12 | Disposition: A | Discharge status: Home / Self Care | Payer: Medicare Other | Attending: Student | Admitting: Student

## 2016-04-12 ENCOUNTER — Encounter: Payer: Self-pay | Admitting: Emergency Medicine

## 2016-04-12 DIAGNOSIS — J441 Chronic obstructive pulmonary disease with (acute) exacerbation: Secondary | ICD-10-CM | POA: Diagnosis not present

## 2016-04-12 DIAGNOSIS — E039 Hypothyroidism, unspecified: Secondary | ICD-10-CM | POA: Insufficient documentation

## 2016-04-12 DIAGNOSIS — R0602 Shortness of breath: Secondary | ICD-10-CM | POA: Diagnosis present

## 2016-04-12 DIAGNOSIS — Z79899 Other long term (current) drug therapy: Secondary | ICD-10-CM | POA: Insufficient documentation

## 2016-04-12 DIAGNOSIS — F17210 Nicotine dependence, cigarettes, uncomplicated: Secondary | ICD-10-CM | POA: Insufficient documentation

## 2016-04-12 LAB — BASIC METABOLIC PANEL
Anion gap: 8 (ref 5–15)
BUN: 7 mg/dL (ref 6–20)
CO2: 25 mmol/L (ref 22–32)
Calcium: 9.3 mg/dL (ref 8.9–10.3)
Chloride: 105 mmol/L (ref 101–111)
Creatinine, Ser: 0.89 mg/dL (ref 0.44–1.00)
GFR calc Af Amer: >60 mL/min (ref >60)
GFR calc non Af Amer: >60 mL/min (ref >60)
Glucose, Bld: 173 mg/dL — ABNORMAL HIGH (ref 65–99)
Potassium: 4.2 mmol/L (ref 3.5–5.1)
Sodium: 138 mmol/L (ref 135–145)

## 2016-04-12 LAB — CBC
HCT: 41 % (ref 35.0–47.0)
Hemoglobin: 14 g/dL (ref 12.0–16.0)
MCH: 28.7 pg (ref 26.0–34.0)
MCHC: 34.2 g/dL (ref 32.0–36.0)
MCV: 84 fL (ref 80.0–100.0)
Platelets: 282 10*3/uL (ref 150–440)
RBC: 4.87 MIL/uL (ref 3.80–5.20)
RDW: 14.2 % (ref 11.5–14.5)
WBC: 11.7 10*3/uL — ABNORMAL HIGH (ref 3.6–11.0)

## 2016-04-12 LAB — TROPONIN I: Troponin I: <0.03 ng/mL (ref <0.031)

## 2016-04-12 MED ORDER — IPRATROPIUM-ALBUTEROL 0.5-2.5 (3) MG/3ML IN SOLN
3.0000 mL | Freq: Once | RESPIRATORY_TRACT | Status: AC
  Administered 2016-04-12: 3 mL via RESPIRATORY_TRACT
  Filled 2016-04-12 (×2): qty 3

## 2016-04-12 MED ORDER — ALBUTEROL SULFATE HFA 108 (90 BASE) MCG/ACT IN AERS: 2.0000 | INHALATION_SPRAY | Freq: Four times a day (QID) | RESPIRATORY_TRACT | Status: DC | PRN

## 2016-04-12 MED ORDER — ALBUTEROL SULFATE (2.5 MG/3ML) 0.083% IN NEBU
5.0000 mg | INHALATION_SOLUTION | Freq: Once | RESPIRATORY_TRACT | Status: AC
  Administered 2016-04-12: 5 mg via RESPIRATORY_TRACT
  Filled 2016-04-12: qty 6

## 2016-04-12 MED ORDER — IPRATROPIUM-ALBUTEROL 0.5-2.5 (3) MG/3ML IN SOLN
3.0000 mL | Freq: Once | RESPIRATORY_TRACT | Status: AC
  Administered 2016-04-12: 3 mL via RESPIRATORY_TRACT
  Filled 2016-04-12: qty 3

## 2016-04-12 MED ORDER — HYDROCODONE-ACETAMINOPHEN 5-325 MG PO TABS
1.0000 | ORAL_TABLET | Freq: Once | ORAL | Status: AC
  Administered 2016-04-12: 1 via ORAL
  Filled 2016-04-12: qty 1

## 2016-04-12 MED ORDER — AZITHROMYCIN 250 MG PO TABS: ORAL_TABLET | ORAL | Status: AC

## 2016-04-12 MED ORDER — METHYLPREDNISOLONE SODIUM SUCC 125 MG IJ SOLR
125.0000 mg | Freq: Once | INTRAMUSCULAR | Status: AC
  Administered 2016-04-12: 125 mg via INTRAVENOUS
  Filled 2016-04-12: qty 2

## 2016-04-12 MED ORDER — PREDNISONE 20 MG PO TABS: 60.0000 mg | ORAL_TABLET | Freq: Every day | ORAL | Status: AC

## 2016-04-12 NOTE — ED Notes (Addendum)
Patient presents to the ED with shortness of breath since 10am this morning.  Patient reports history of COPD.  Patient reports cough that causes back pain.  Patient states, "I don't feel like my medicines are getting down in my lungs."  Patient is speaking in full sentences without obvious difficulty.  Patient reports chest pain with coughing and deep breathing.

## 2016-04-12 NOTE — ED Provider Notes (Signed)
[facility] Emergency Department Provider Note   ____________________________________________  Time seen: Approximately 1:03 PM  I have reviewed the triage vital signs and the nursing notes.   HISTORY  Chief Complaint Shortness of Breath    HPI Alison Taylor is a 60 y.o. female with history of COPD, hypothyroidism presenting with 2-3 days of shortness of breath, cough which feels similar to her prior COPD exacerbations, gradual onset, constant, no modifying factors. She reports she has been using her inhalers but "I don't feel like the medicines are getting to my lungs". She has had some nonradiating, nonexertional left-sided chest pain, constant since 10 AM, that is worse with cough and deep inspiration. No vomiting, diarrhea, fevers or chills.   Past Medical History  Diagnosis Date  . COPD (chronic obstructive pulmonary disease) ([place])   . Lumbar stenosis   . Spinal stenosis of sacral region   . Thyroid disease     hypo  . Tobacco abuse     Patient Active Problem List   Diagnosis Date Noted  . COPD exacerbation ([place]) 12/10/2015    History reviewed. No pertinent past surgical history.  Current Outpatient Rx  Name  Route  Sig  Dispense  Refill  . albuterol (PROVENTIL HFA;VENTOLIN HFA) 108 (90 Base) MCG/ACT inhaler   Inhalation   Inhale 2 puffs into the lungs every 6 (six) hours as needed for wheezing or shortness of breath.   8 g   0   . b complex vitamins tablet   Oral   Take 1 tablet by mouth daily.         . benzonatate (TESSALON PERLES) 100 MG capsule   Oral   Take 1 capsule (100 mg total) by mouth 3 (three) times daily as needed for cough.   20 capsule   0   . cholecalciferol (VITAMIN D) 1000 units tablet   Oral   Take 2,000 Units by mouth daily.         . cyclobenzaprine (FLEXERIL) 10 MG tablet   Oral   Take 10 mg by mouth 3 (three) times daily as needed for muscle spasms.         . Fluticasone-Salmeterol (ADVAIR)  250-50 MCG/DOSE AEPB   Inhalation   Inhale 1 puff into the lungs 2 (two) times daily.   60 each   0   . gabapentin (NEURONTIN) 800 MG tablet   Oral   Take 800 mg by mouth 3 (three) times daily.         Marland Kitchen HYDROcodone-acetaminophen (NORCO/VICODIN) 5-325 MG tablet   Oral   Take 1 tablet by mouth every 6 (six) hours as needed for moderate pain.         Marland Kitchen ipratropium-albuterol (DUONEB) 0.5-2.5 (3) MG/3ML SOLN   Nebulization   Take 3 mLs by nebulization every 4 (four) hours as needed (for wheezing/shortness of breath).         Marland Kitchen levothyroxine (SYNTHROID, LEVOTHROID) 150 MCG tablet   Oral   Take 150 mcg by mouth daily before breakfast.         . metoprolol tartrate (LOPRESSOR) 25 MG tablet   Oral   Take 25 mg by mouth 2 (two) times daily.         . Multiple Vitamins-Minerals (OCUVITE EYE HEATLH GUMMIES PO)   Oral   Take 2 each by mouth daily.         Marland Kitchen omeprazole (PRILOSEC) 20 MG capsule   Oral   Take 20 mg  by mouth daily as needed (for acid reflux).           Allergies Keflex and Ivp dye  Family History  Problem Relation Age of Onset  . ALS Brother   . Breast cancer Neg Hx     Social History Social History  Substance Use Topics  . Smoking status: Current Every Day Smoker -- 0.50 packs/day    Types: Cigarettes  . Smokeless tobacco: None  . Alcohol Use: No    Review of Systems Constitutional: No fever/chills Eyes: No visual changes. ENT: No sore throat. Cardiovascular: +chest pain. Respiratory: + shortness of breath. Gastrointestinal: No abdominal pain.  No nausea, no vomiting.  No diarrhea.  No constipation. Genitourinary: Negative for dysuria. Musculoskeletal: Negative for back pain. Skin: Negative for rash. Neurological: Negative for headaches, focal weakness or numbness.  10-point ROS otherwise negative.  ____________________________________________   PHYSICAL EXAM:  Filed Vitals:   04/12/16 1500 04/12/16 1530 04/12/16 1600 04/12/16  1635  BP: 116/68 136/73 135/76 127/66  Pulse: 91 94 97 99  Temp:    97.7 F (36.5 C)  TempSrc:    Oral  Resp: 26 27 27 20   Height:      Weight:      SpO2: 96% 95% 90% 95%    VITAL SIGNS: ED Triage Vitals  Enc Vitals Group     BP 04/12/16 1200 120/56 mmHg     Pulse Rate 04/12/16 1200 110     Resp 04/12/16 1200 26     Temp 04/12/16 1200 98.2 F (36.8 C)     Temp Source 04/12/16 1200 Oral     SpO2 04/12/16 1200 95 %     Weight 04/12/16 1200 200 lb (90.719 kg)     Height 04/12/16 1200 5\' 11"  (1.803 m)     Head Cir --      Peak Flow --      Pain Score 04/12/16 1200 8     Pain Loc --      Pain Edu? --      Excl. in [place]? --     Constitutional: Alert and oriented. Nontoxic-appearing and in no acute distress. Eyes: Conjunctivae are normal. PERRL. EOMI. Head: Atraumatic. Nose: No congestion/rhinnorhea. Mouth/Throat: Mucous membranes are moist.  Oropharynx non-erythematous. Neck: No stridor. Supple without meningismus. Cardiovascular: Mildly tachycardic rate, regular rhythm. Grossly normal heart sounds.  Good peripheral circulation. Respiratory: Mild tachypnea with increased work of breathing, poor air movement, faint expiratory wheeze. Gastrointestinal: Soft and nontender. No distention.  No CVA tenderness. Genitourinary: deferred Musculoskeletal: No lower extremity tenderness nor edema.  No joint effusions. Neurologic:  Normal speech and language. No gross focal neurologic deficits are appreciated.  Skin:  Skin is warm, dry and intact. No rash noted. Psychiatric: Mood and affect are normal. Speech and behavior are normal.  ____________________________________________   LABS (all labs ordered are listed, but only abnormal results are displayed)  Labs Reviewed  BASIC METABOLIC PANEL - Abnormal; Notable for the following:    Glucose, Bld 173 (*)    All other components within normal limits  CBC - Abnormal; Notable for the following:    WBC 11.7 (*)    All other components  within normal limits  TROPONIN I   ____________________________________________  EKG  ED ECG REPORT I, Joanne Gavel, the attending physician, personally viewed and interpreted this ECG.   Date: 04/12/2016  EKG Time: 12:06  Rate: 111  Rhythm: sinus tachycardia  Axis: right superior axis  Intervals:none  ST&T  Change: No acute ST elevation.  ____________________________________________  RADIOLOGY  CXR IMPRESSION: COPD/ chronic bronchitis. No acute superimposed process.  Aortic atherosclerosis. ____________________________________________   PROCEDURES  Procedure(s) performed: None  Critical Care performed: No  ____________________________________________   INITIAL IMPRESSION / ASSESSMENT AND PLAN / ED COURSE  Pertinent labs & imaging results that were available during my care of the patient were reviewed by me and considered in my medical decision making (see chart for details).  Alison Taylor is a 60 y.o. female with history of COPD, hypothyroidism presenting with 2-3 days of shortness of breath, cough which feels similar to her prior COPD exacerbations. On exam, she is mildly tachycardic, mildly tachypneic with poor air movement, expiratory wheeze concerning for COPD exacerbation. K do not consistent with acute ischemia. Plan for screening labs, we'll treat symptomatically with DuoNebs, steroids, obtain chest x-ray, reassess for disposition. Musculoskeletal pain in the left chest secondary to cough.  ----------------------------------------- 4:35 PM on 04/12/2016 ----------------------------------------- Labs reviewed. CBC with mild leukocytosis, otherwise unremarkable. Unremarkable BMP and negative troponin. Doubt ACS. Likely MSK chest pain, constant and 10 AM, one negative troponin is a physician. Doubt PE or acute aortic dissection. Chest x-ray shows COPD/chronic bronchitis. Patient with improvement of her wheezes and improvement of air movement at this time  after DuoNebs and steroids. She has refused admission, reports she feels well and desires discharge. She ambulates well, able to her O2 sat is 95% on room air, no oxygen requirement. DC with return precautions and close PCP follow-up. She is comfortable with the discharge plan. ____________________________________________   FINAL CLINICAL IMPRESSION(S) / ED DIAGNOSES  Final diagnoses:  COPD exacerbation ([place])      NEW MEDICATIONS STARTED DURING THIS VISIT:  New Prescriptions   No medications on file     Note:  This document was prepared using Dragon voice recognition software and may include unintentional dictation errors.    Joanne Gavel, MD 04/12/16 [phone]

## 2016-04-12 NOTE — ED Notes (Signed)
Pt ambulated around  the department. Her oxygen saturation was 93%. When we finished her oxygen saturation was 95%.

## 2016-05-17 DIAGNOSIS — E220 Acromegaly and pituitary gigantism: Secondary | ICD-10-CM | POA: Insufficient documentation

## 2016-05-17 DIAGNOSIS — M2550 Pain in unspecified joint: Secondary | ICD-10-CM | POA: Insufficient documentation

## 2016-05-17 DIAGNOSIS — R200 Anesthesia of skin: Secondary | ICD-10-CM | POA: Insufficient documentation

## 2016-07-04 DIAGNOSIS — N3000 Acute cystitis without hematuria: Secondary | ICD-10-CM | POA: Insufficient documentation

## 2016-09-27 ENCOUNTER — Other Ambulatory Visit: Payer: Self-pay | Admitting: Internal Medicine

## 2016-09-27 DIAGNOSIS — E220 Acromegaly and pituitary gigantism: Secondary | ICD-10-CM

## 2016-10-04 ENCOUNTER — Ambulatory Visit: Payer: Medicare Other

## 2016-10-11 ENCOUNTER — Ambulatory Visit
Admission: RE | Admit: 2016-10-11 | Discharge: 2016-10-11 | Disposition: A | Discharge status: Home / Self Care | Payer: Medicare Other | Source: Ambulatory Visit | Attending: Internal Medicine | Admitting: Internal Medicine

## 2016-10-11 DIAGNOSIS — E220 Acromegaly and pituitary gigantism: Secondary | ICD-10-CM | POA: Insufficient documentation

## 2016-10-11 DIAGNOSIS — H748X1 Other specified disorders of right middle ear and mastoid: Secondary | ICD-10-CM | POA: Insufficient documentation

## 2016-10-11 MED ORDER — GADOBENATE DIMEGLUMINE 529 MG/ML IV SOLN
10.0000 mL | Freq: Once | INTRAVENOUS | Status: AC | PRN
  Administered 2016-10-11: 9 mL via INTRAVENOUS

## 2016-12-06 DIAGNOSIS — E119 Type 2 diabetes mellitus without complications: Secondary | ICD-10-CM | POA: Insufficient documentation

## 2017-01-06 ENCOUNTER — Other Ambulatory Visit: Payer: Self-pay | Admitting: Nurse Practitioner

## 2017-01-06 DIAGNOSIS — R918 Other nonspecific abnormal finding of lung field: Secondary | ICD-10-CM

## 2017-01-12 ENCOUNTER — Ambulatory Visit: Payer: Medicare Other

## 2017-02-27 DIAGNOSIS — J9602 Acute respiratory failure with hypercapnia: Secondary | ICD-10-CM

## 2017-02-27 DIAGNOSIS — J9601 Acute respiratory failure with hypoxia: Secondary | ICD-10-CM

## 2017-03-30 DIAGNOSIS — E119 Type 2 diabetes mellitus without complications: Secondary | ICD-10-CM

## 2017-04-27 DIAGNOSIS — N390 Urinary tract infection, site not specified: Secondary | ICD-10-CM | POA: Insufficient documentation

## 2017-04-27 DIAGNOSIS — Z8619 Personal history of other infectious and parasitic diseases: Secondary | ICD-10-CM | POA: Insufficient documentation

## 2017-04-28 DIAGNOSIS — N179 Acute kidney failure, unspecified: Secondary | ICD-10-CM | POA: Insufficient documentation

## 2017-05-24 IMAGING — CR DG CHEST 2V
2 series · 2 of 2 positions shown · non-contrast
Comparison: Report of a chest x-ray dated April 26, 2002

CLINICAL DATA: Onset of shortness of breath last night; history of
COPD ; abnormal breath sounds of time of arrival, nonproductive
cough.

EXAM:
CHEST  2 VIEW

[chest lat]
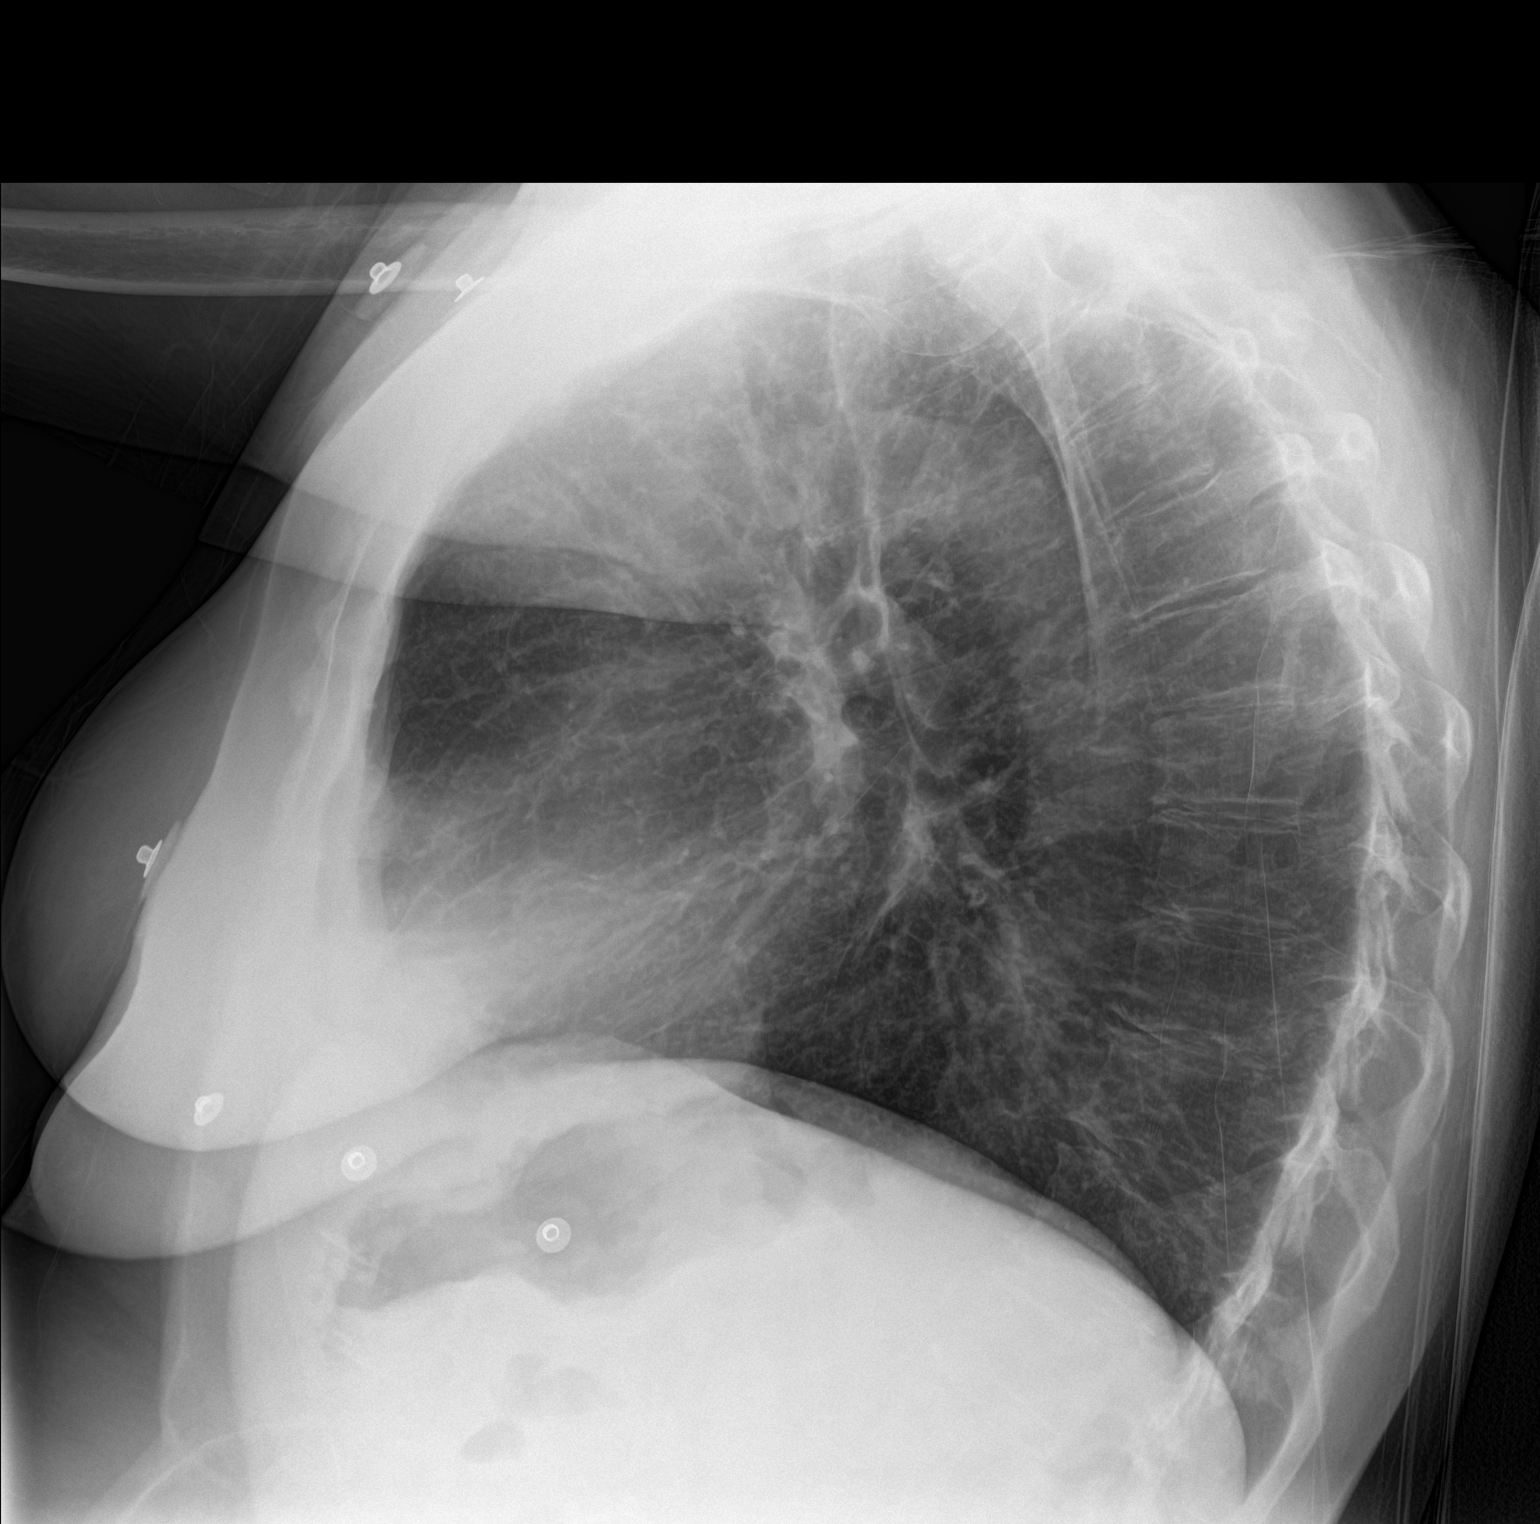

[chest ap]
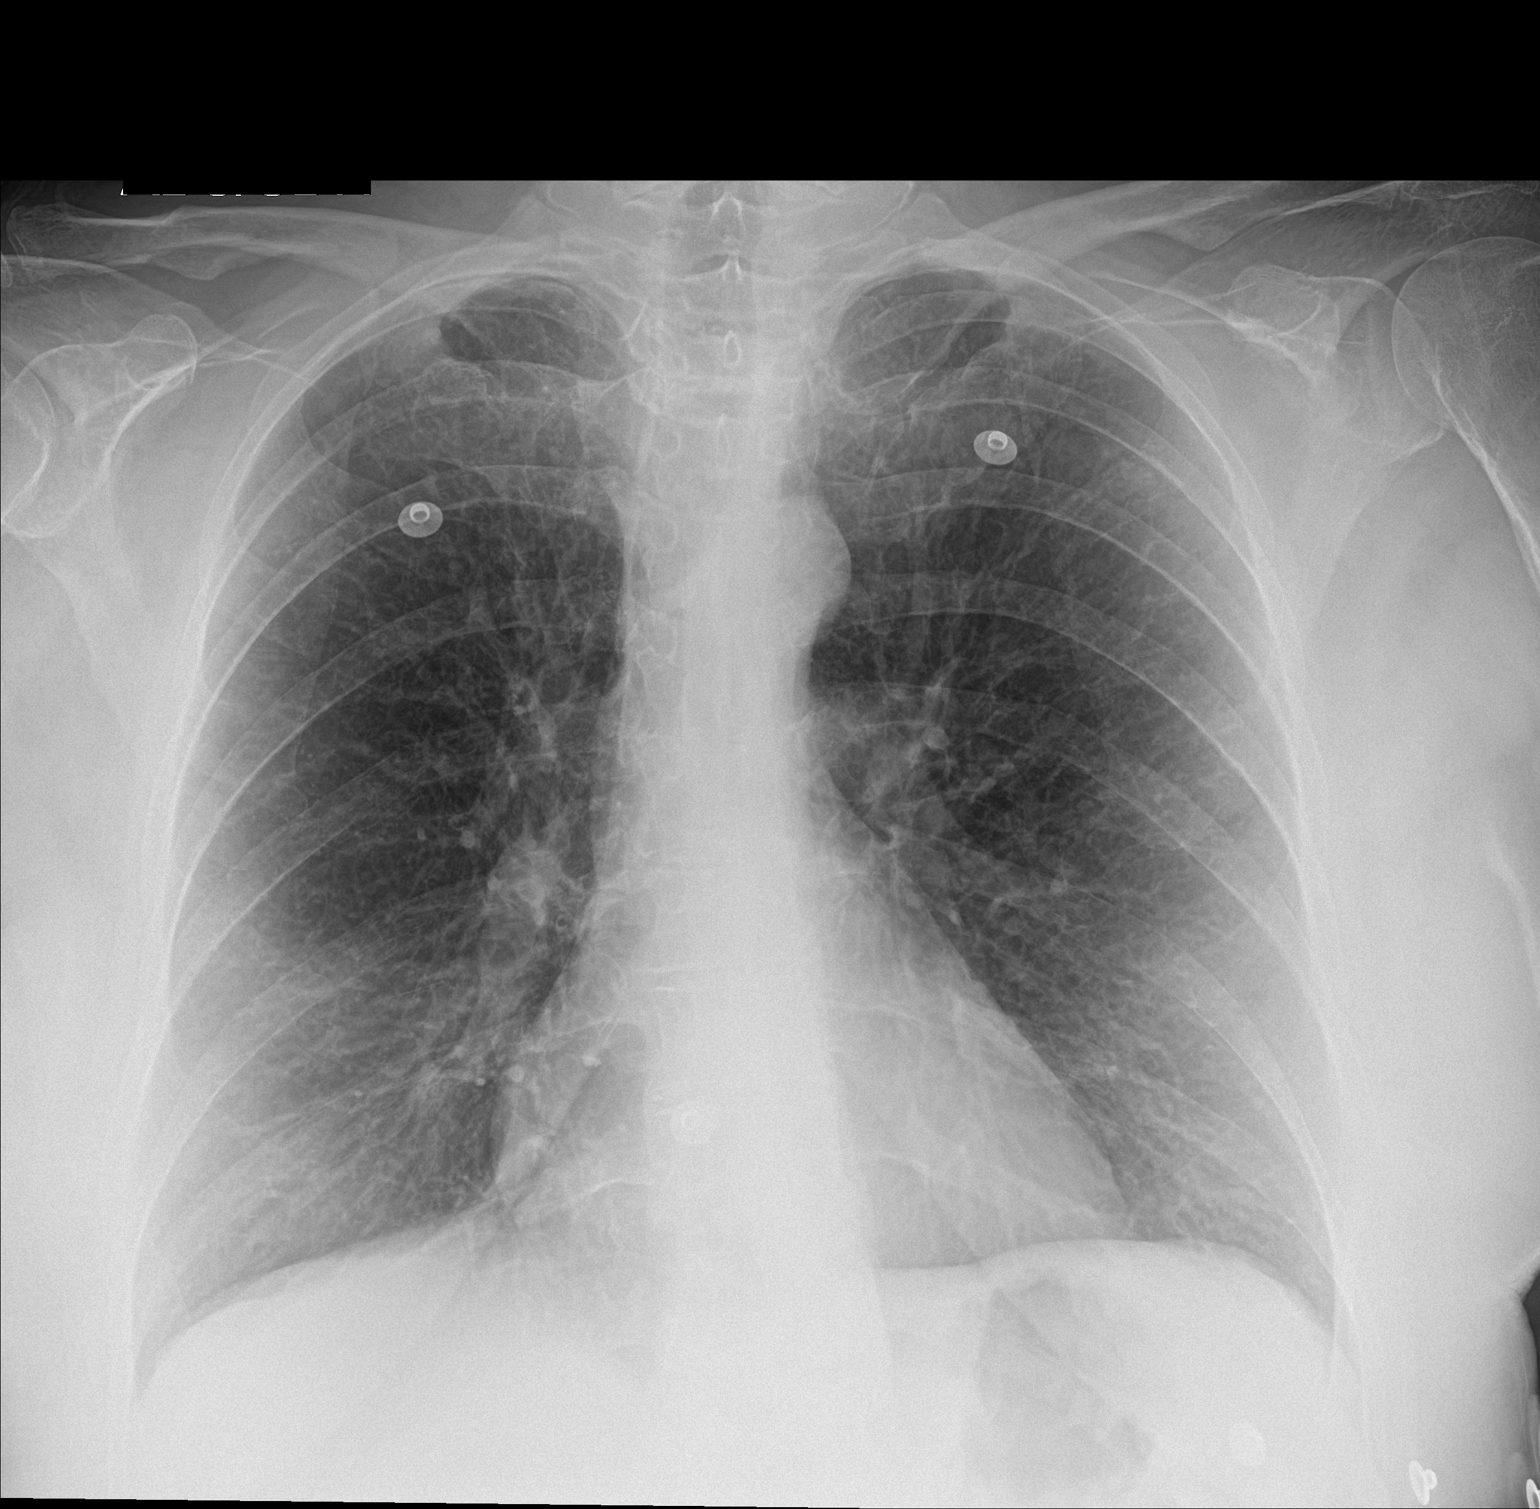

[2 of 2 positions shown; findings below may reference images not displayed]

FINDINGS: The lungs are mildly hyperinflated. There is no focal infiltrate.
There is no pleural effusion. The heart and pulmonary vascularity
are normal. The mediastinum is normal in width. There is no pleural
effusion. The bony thorax exhibits no acute abnormality.
IMPRESSION: Hyperinflation consistent with COPD. There is no evidence of
pneumonia, pulmonary edema, nor other acute cardiopulmonary
abnormality.

## 2017-12-09 DIAGNOSIS — R81 Glycosuria: Secondary | ICD-10-CM | POA: Insufficient documentation

## 2017-12-22 ENCOUNTER — Other Ambulatory Visit (HOSPITAL_COMMUNITY): Payer: Self-pay | Admitting: Specialist

## 2017-12-22 DIAGNOSIS — R911 Solitary pulmonary nodule: Secondary | ICD-10-CM

## 2017-12-26 ENCOUNTER — Ambulatory Visit
Admission: RE | Admit: 2017-12-26 | Discharge: 2017-12-26 | Disposition: A | Discharge status: Home / Self Care | Payer: Medicare Other | Source: Ambulatory Visit | Attending: Specialist | Admitting: Specialist

## 2017-12-26 DIAGNOSIS — I700 Atherosclerosis of aorta: Secondary | ICD-10-CM | POA: Diagnosis not present

## 2017-12-26 DIAGNOSIS — J439 Emphysema, unspecified: Secondary | ICD-10-CM | POA: Diagnosis not present

## 2017-12-26 DIAGNOSIS — R911 Solitary pulmonary nodule: Secondary | ICD-10-CM | POA: Insufficient documentation

## 2018-11-17 ENCOUNTER — Other Ambulatory Visit: Payer: Self-pay | Admitting: Specialist

## 2018-11-17 DIAGNOSIS — R911 Solitary pulmonary nodule: Secondary | ICD-10-CM

## 2018-12-19 ENCOUNTER — Ambulatory Visit: Payer: Medicare Other | Attending: Specialist

## 2019-04-19 ENCOUNTER — Other Ambulatory Visit: Payer: Self-pay | Admitting: Specialist

## 2019-04-19 DIAGNOSIS — R911 Solitary pulmonary nodule: Secondary | ICD-10-CM

## 2019-05-04 ENCOUNTER — Ambulatory Visit: Payer: Medicare Other

## 2019-05-11 ENCOUNTER — Ambulatory Visit: Payer: Medicare Other | Attending: Specialist

## 2019-06-08 ENCOUNTER — Other Ambulatory Visit: Payer: Self-pay | Admitting: Specialist

## 2019-06-08 DIAGNOSIS — R0609 Other forms of dyspnea: Secondary | ICD-10-CM

## 2019-06-08 DIAGNOSIS — R911 Solitary pulmonary nodule: Secondary | ICD-10-CM

## 2019-06-14 ENCOUNTER — Ambulatory Visit
Admission: RE | Admit: 2019-06-14 | Discharge: 2019-06-14 | Disposition: A | Discharge status: Home / Self Care | Payer: Medicare Other | Source: Ambulatory Visit | Attending: Specialist | Admitting: Specialist

## 2019-06-14 ENCOUNTER — Other Ambulatory Visit: Payer: Self-pay

## 2019-06-14 DIAGNOSIS — R911 Solitary pulmonary nodule: Secondary | ICD-10-CM

## 2019-06-14 DIAGNOSIS — R0609 Other forms of dyspnea: Secondary | ICD-10-CM

## 2019-06-25 ENCOUNTER — Other Ambulatory Visit: Payer: Self-pay | Admitting: Specialist

## 2019-06-25 DIAGNOSIS — R0609 Other forms of dyspnea: Secondary | ICD-10-CM

## 2019-06-25 DIAGNOSIS — R918 Other nonspecific abnormal finding of lung field: Secondary | ICD-10-CM

## 2019-10-07 DIAGNOSIS — J9611 Chronic respiratory failure with hypoxia: Secondary | ICD-10-CM | POA: Insufficient documentation

## 2019-10-07 DIAGNOSIS — N12 Tubulo-interstitial nephritis, not specified as acute or chronic: Secondary | ICD-10-CM | POA: Insufficient documentation

## 2019-10-07 DIAGNOSIS — R1031 Right lower quadrant pain: Secondary | ICD-10-CM | POA: Insufficient documentation

## 2019-10-09 DIAGNOSIS — M009 Pyogenic arthritis, unspecified: Secondary | ICD-10-CM | POA: Insufficient documentation

## 2019-11-13 ENCOUNTER — Other Ambulatory Visit
Admission: RE | Admit: 2019-11-13 | Discharge: 2019-11-13 | Disposition: A | Discharge status: Home / Self Care | Payer: Medicare Other | Source: Ambulatory Visit | Attending: Specialist | Admitting: Specialist

## 2019-11-13 DIAGNOSIS — M79604 Pain in right leg: Secondary | ICD-10-CM | POA: Insufficient documentation

## 2019-11-13 LAB — FIBRIN DERIVATIVES D-DIMER (ARMC ONLY): Fibrin derivatives D-dimer (ARMC): 1341.99 ng/mL (FEU) — ABNORMAL HIGH (ref 0.00–499.00)

## 2019-11-14 ENCOUNTER — Ambulatory Visit
Admission: RE | Admit: 2019-11-14 | Discharge: 2019-11-14 | Disposition: A | Discharge status: Home / Self Care | Payer: Medicare Other | Source: Ambulatory Visit | Attending: Specialist | Admitting: Specialist

## 2019-11-14 ENCOUNTER — Other Ambulatory Visit (HOSPITAL_COMMUNITY): Payer: Self-pay | Admitting: Specialist

## 2019-11-14 ENCOUNTER — Other Ambulatory Visit: Payer: Self-pay

## 2019-11-14 ENCOUNTER — Other Ambulatory Visit: Payer: Self-pay | Admitting: Specialist

## 2019-11-14 DIAGNOSIS — M79605 Pain in left leg: Secondary | ICD-10-CM

## 2019-11-14 DIAGNOSIS — M79604 Pain in right leg: Secondary | ICD-10-CM | POA: Diagnosis present

## 2020-01-14 DIAGNOSIS — J449 Chronic obstructive pulmonary disease, unspecified: Secondary | ICD-10-CM | POA: Diagnosis not present

## 2020-01-15 DIAGNOSIS — J449 Chronic obstructive pulmonary disease, unspecified: Secondary | ICD-10-CM | POA: Diagnosis not present

## 2020-02-01 DIAGNOSIS — E11618 Type 2 diabetes mellitus with other diabetic arthropathy: Secondary | ICD-10-CM | POA: Diagnosis not present

## 2020-02-01 DIAGNOSIS — Z79899 Other long term (current) drug therapy: Secondary | ICD-10-CM | POA: Diagnosis not present

## 2020-02-01 DIAGNOSIS — J9612 Chronic respiratory failure with hypercapnia: Secondary | ICD-10-CM | POA: Diagnosis not present

## 2020-02-01 DIAGNOSIS — J449 Chronic obstructive pulmonary disease, unspecified: Secondary | ICD-10-CM | POA: Diagnosis not present

## 2020-02-07 DIAGNOSIS — J449 Chronic obstructive pulmonary disease, unspecified: Secondary | ICD-10-CM | POA: Diagnosis not present

## 2020-02-15 DIAGNOSIS — J449 Chronic obstructive pulmonary disease, unspecified: Secondary | ICD-10-CM | POA: Diagnosis not present

## 2020-03-04 DIAGNOSIS — J449 Chronic obstructive pulmonary disease, unspecified: Secondary | ICD-10-CM | POA: Diagnosis not present

## 2020-03-07 DIAGNOSIS — J449 Chronic obstructive pulmonary disease, unspecified: Secondary | ICD-10-CM | POA: Diagnosis not present

## 2020-03-15 DIAGNOSIS — G8918 Other acute postprocedural pain: Secondary | ICD-10-CM | POA: Diagnosis not present

## 2020-03-15 DIAGNOSIS — M25452 Effusion, left hip: Secondary | ICD-10-CM | POA: Diagnosis not present

## 2020-03-15 DIAGNOSIS — I10 Essential (primary) hypertension: Secondary | ICD-10-CM | POA: Diagnosis not present

## 2020-03-15 DIAGNOSIS — F17210 Nicotine dependence, cigarettes, uncomplicated: Secondary | ICD-10-CM | POA: Diagnosis not present

## 2020-03-15 DIAGNOSIS — K219 Gastro-esophageal reflux disease without esophagitis: Secondary | ICD-10-CM | POA: Diagnosis not present

## 2020-03-15 DIAGNOSIS — I959 Hypotension, unspecified: Secondary | ICD-10-CM | POA: Diagnosis not present

## 2020-03-15 DIAGNOSIS — M25532 Pain in left wrist: Secondary | ICD-10-CM | POA: Diagnosis not present

## 2020-03-15 DIAGNOSIS — E119 Type 2 diabetes mellitus without complications: Secondary | ICD-10-CM | POA: Diagnosis not present

## 2020-03-15 DIAGNOSIS — Z7952 Long term (current) use of systemic steroids: Secondary | ICD-10-CM | POA: Diagnosis not present

## 2020-03-15 DIAGNOSIS — M00852 Arthritis due to other bacteria, left hip: Secondary | ICD-10-CM | POA: Diagnosis not present

## 2020-03-15 DIAGNOSIS — J441 Chronic obstructive pulmonary disease with (acute) exacerbation: Secondary | ICD-10-CM | POA: Diagnosis not present

## 2020-03-15 DIAGNOSIS — M13832 Other specified arthritis, left wrist: Secondary | ICD-10-CM | POA: Diagnosis not present

## 2020-03-15 DIAGNOSIS — E229 Hyperfunction of pituitary gland, unspecified: Secondary | ICD-10-CM | POA: Diagnosis not present

## 2020-03-15 DIAGNOSIS — M13852 Other specified arthritis, left hip: Secondary | ICD-10-CM | POA: Diagnosis not present

## 2020-03-15 DIAGNOSIS — M13811 Other specified arthritis, right shoulder: Secondary | ICD-10-CM | POA: Diagnosis not present

## 2020-03-15 DIAGNOSIS — E039 Hypothyroidism, unspecified: Secondary | ICD-10-CM | POA: Diagnosis not present

## 2020-03-15 DIAGNOSIS — M00832 Arthritis due to other bacteria, left wrist: Secondary | ICD-10-CM | POA: Diagnosis not present

## 2020-03-15 DIAGNOSIS — Z882 Allergy status to sulfonamides status: Secondary | ICD-10-CM | POA: Diagnosis not present

## 2020-03-15 DIAGNOSIS — R918 Other nonspecific abnormal finding of lung field: Secondary | ICD-10-CM | POA: Diagnosis not present

## 2020-03-15 DIAGNOSIS — G8929 Other chronic pain: Secondary | ICD-10-CM | POA: Diagnosis not present

## 2020-03-15 DIAGNOSIS — M19011 Primary osteoarthritis, right shoulder: Secondary | ICD-10-CM | POA: Diagnosis not present

## 2020-03-15 DIAGNOSIS — J449 Chronic obstructive pulmonary disease, unspecified: Secondary | ICD-10-CM | POA: Diagnosis not present

## 2020-03-15 DIAGNOSIS — R000 Tachycardia, unspecified: Secondary | ICD-10-CM | POA: Diagnosis not present

## 2020-03-15 DIAGNOSIS — R600 Localized edema: Secondary | ICD-10-CM | POA: Diagnosis not present

## 2020-03-15 DIAGNOSIS — B9689 Other specified bacterial agents as the cause of diseases classified elsewhere: Secondary | ICD-10-CM | POA: Diagnosis not present

## 2020-03-15 DIAGNOSIS — Z885 Allergy status to narcotic agent status: Secondary | ICD-10-CM | POA: Diagnosis not present

## 2020-03-15 DIAGNOSIS — M1909 Primary osteoarthritis, other specified site: Secondary | ICD-10-CM | POA: Diagnosis not present

## 2020-03-15 DIAGNOSIS — M00811 Arthritis due to other bacteria, right shoulder: Secondary | ICD-10-CM | POA: Diagnosis not present

## 2020-03-15 DIAGNOSIS — Z5181 Encounter for therapeutic drug level monitoring: Secondary | ICD-10-CM | POA: Diagnosis not present

## 2020-03-15 DIAGNOSIS — E1140 Type 2 diabetes mellitus with diabetic neuropathy, unspecified: Secondary | ICD-10-CM | POA: Diagnosis not present

## 2020-03-15 DIAGNOSIS — Z794 Long term (current) use of insulin: Secondary | ICD-10-CM | POA: Diagnosis not present

## 2020-03-15 DIAGNOSIS — M25552 Pain in left hip: Secondary | ICD-10-CM | POA: Diagnosis not present

## 2020-03-15 DIAGNOSIS — R509 Fever, unspecified: Secondary | ICD-10-CM | POA: Diagnosis not present

## 2020-03-15 DIAGNOSIS — M19032 Primary osteoarthritis, left wrist: Secondary | ICD-10-CM | POA: Diagnosis not present

## 2020-03-15 DIAGNOSIS — Z79891 Long term (current) use of opiate analgesic: Secondary | ICD-10-CM | POA: Diagnosis not present

## 2020-03-15 DIAGNOSIS — M7122 Synovial cyst of popliteal space [Baker], left knee: Secondary | ICD-10-CM | POA: Diagnosis not present

## 2020-03-15 DIAGNOSIS — Z87440 Personal history of urinary (tract) infections: Secondary | ICD-10-CM | POA: Diagnosis not present

## 2020-03-15 DIAGNOSIS — M7989 Other specified soft tissue disorders: Secondary | ICD-10-CM | POA: Diagnosis not present

## 2020-03-15 DIAGNOSIS — Z20822 Contact with and (suspected) exposure to covid-19: Secondary | ICD-10-CM | POA: Diagnosis not present

## 2020-03-15 DIAGNOSIS — J9611 Chronic respiratory failure with hypoxia: Secondary | ICD-10-CM | POA: Diagnosis not present

## 2020-03-15 DIAGNOSIS — M47816 Spondylosis without myelopathy or radiculopathy, lumbar region: Secondary | ICD-10-CM | POA: Diagnosis not present

## 2020-03-15 DIAGNOSIS — Z9981 Dependence on supplemental oxygen: Secondary | ICD-10-CM | POA: Diagnosis not present

## 2020-03-15 DIAGNOSIS — M25562 Pain in left knee: Secondary | ICD-10-CM | POA: Diagnosis not present

## 2020-03-15 DIAGNOSIS — Z792 Long term (current) use of antibiotics: Secondary | ICD-10-CM | POA: Diagnosis not present

## 2020-03-15 DIAGNOSIS — E220 Acromegaly and pituitary gigantism: Secondary | ICD-10-CM | POA: Diagnosis not present

## 2020-03-15 DIAGNOSIS — Z79899 Other long term (current) drug therapy: Secondary | ICD-10-CM | POA: Diagnosis not present

## 2020-03-15 DIAGNOSIS — J45901 Unspecified asthma with (acute) exacerbation: Secondary | ICD-10-CM | POA: Diagnosis not present

## 2020-03-15 DIAGNOSIS — M009 Pyogenic arthritis, unspecified: Secondary | ICD-10-CM | POA: Diagnosis not present

## 2020-03-15 DIAGNOSIS — M25551 Pain in right hip: Secondary | ICD-10-CM | POA: Diagnosis not present

## 2020-03-15 DIAGNOSIS — M79644 Pain in right finger(s): Secondary | ICD-10-CM | POA: Diagnosis not present

## 2020-03-15 DIAGNOSIS — J9612 Chronic respiratory failure with hypercapnia: Secondary | ICD-10-CM | POA: Diagnosis not present

## 2020-03-15 DIAGNOSIS — M25432 Effusion, left wrist: Secondary | ICD-10-CM | POA: Diagnosis not present

## 2020-03-15 DIAGNOSIS — Z888 Allergy status to other drugs, medicaments and biological substances status: Secondary | ICD-10-CM | POA: Diagnosis not present

## 2020-03-15 DIAGNOSIS — R9431 Abnormal electrocardiogram [ECG] [EKG]: Secondary | ICD-10-CM | POA: Diagnosis not present

## 2020-03-15 DIAGNOSIS — J439 Emphysema, unspecified: Secondary | ICD-10-CM | POA: Diagnosis not present

## 2020-03-15 DIAGNOSIS — R52 Pain, unspecified: Secondary | ICD-10-CM | POA: Diagnosis not present

## 2020-03-15 DIAGNOSIS — Z9109 Other allergy status, other than to drugs and biological substances: Secondary | ICD-10-CM | POA: Diagnosis not present

## 2020-03-15 DIAGNOSIS — M25511 Pain in right shoulder: Secondary | ICD-10-CM | POA: Diagnosis not present

## 2020-03-19 DIAGNOSIS — F17200 Nicotine dependence, unspecified, uncomplicated: Secondary | ICD-10-CM | POA: Insufficient documentation

## 2020-03-22 DIAGNOSIS — E220 Acromegaly and pituitary gigantism: Secondary | ICD-10-CM | POA: Diagnosis not present

## 2020-03-22 DIAGNOSIS — J9611 Chronic respiratory failure with hypoxia: Secondary | ICD-10-CM | POA: Diagnosis not present

## 2020-03-22 DIAGNOSIS — J439 Emphysema, unspecified: Secondary | ICD-10-CM | POA: Diagnosis not present

## 2020-03-22 DIAGNOSIS — J45901 Unspecified asthma with (acute) exacerbation: Secondary | ICD-10-CM | POA: Diagnosis not present

## 2020-03-22 DIAGNOSIS — G8929 Other chronic pain: Secondary | ICD-10-CM | POA: Diagnosis not present

## 2020-03-22 DIAGNOSIS — E039 Hypothyroidism, unspecified: Secondary | ICD-10-CM | POA: Diagnosis not present

## 2020-03-22 DIAGNOSIS — Z792 Long term (current) use of antibiotics: Secondary | ICD-10-CM | POA: Diagnosis not present

## 2020-03-22 DIAGNOSIS — J9612 Chronic respiratory failure with hypercapnia: Secondary | ICD-10-CM | POA: Diagnosis not present

## 2020-03-22 DIAGNOSIS — Z87440 Personal history of urinary (tract) infections: Secondary | ICD-10-CM | POA: Diagnosis not present

## 2020-03-22 DIAGNOSIS — I10 Essential (primary) hypertension: Secondary | ICD-10-CM | POA: Diagnosis not present

## 2020-03-22 DIAGNOSIS — J441 Chronic obstructive pulmonary disease with (acute) exacerbation: Secondary | ICD-10-CM | POA: Diagnosis not present

## 2020-03-22 DIAGNOSIS — Z5181 Encounter for therapeutic drug level monitoring: Secondary | ICD-10-CM | POA: Diagnosis not present

## 2020-03-22 DIAGNOSIS — Z7952 Long term (current) use of systemic steroids: Secondary | ICD-10-CM | POA: Diagnosis not present

## 2020-03-22 DIAGNOSIS — Z9981 Dependence on supplemental oxygen: Secondary | ICD-10-CM | POA: Diagnosis not present

## 2020-03-22 DIAGNOSIS — Z794 Long term (current) use of insulin: Secondary | ICD-10-CM | POA: Diagnosis not present

## 2020-03-22 DIAGNOSIS — E119 Type 2 diabetes mellitus without complications: Secondary | ICD-10-CM | POA: Diagnosis not present

## 2020-03-22 DIAGNOSIS — M009 Pyogenic arthritis, unspecified: Secondary | ICD-10-CM | POA: Diagnosis not present

## 2020-03-23 DIAGNOSIS — J441 Chronic obstructive pulmonary disease with (acute) exacerbation: Secondary | ICD-10-CM | POA: Diagnosis not present

## 2020-03-23 DIAGNOSIS — J45901 Unspecified asthma with (acute) exacerbation: Secondary | ICD-10-CM | POA: Diagnosis not present

## 2020-03-24 DIAGNOSIS — M009 Pyogenic arthritis, unspecified: Secondary | ICD-10-CM | POA: Diagnosis not present

## 2020-03-24 DIAGNOSIS — Z792 Long term (current) use of antibiotics: Secondary | ICD-10-CM | POA: Diagnosis not present

## 2020-03-24 DIAGNOSIS — J9612 Chronic respiratory failure with hypercapnia: Secondary | ICD-10-CM | POA: Diagnosis not present

## 2020-03-24 DIAGNOSIS — J45901 Unspecified asthma with (acute) exacerbation: Secondary | ICD-10-CM | POA: Diagnosis not present

## 2020-03-24 DIAGNOSIS — Z794 Long term (current) use of insulin: Secondary | ICD-10-CM | POA: Diagnosis not present

## 2020-03-24 DIAGNOSIS — J439 Emphysema, unspecified: Secondary | ICD-10-CM | POA: Diagnosis not present

## 2020-03-24 DIAGNOSIS — E119 Type 2 diabetes mellitus without complications: Secondary | ICD-10-CM | POA: Diagnosis not present

## 2020-03-24 DIAGNOSIS — J9611 Chronic respiratory failure with hypoxia: Secondary | ICD-10-CM | POA: Diagnosis not present

## 2020-03-24 DIAGNOSIS — E039 Hypothyroidism, unspecified: Secondary | ICD-10-CM | POA: Diagnosis not present

## 2020-03-24 DIAGNOSIS — Z5181 Encounter for therapeutic drug level monitoring: Secondary | ICD-10-CM | POA: Diagnosis not present

## 2020-03-24 DIAGNOSIS — Z87440 Personal history of urinary (tract) infections: Secondary | ICD-10-CM | POA: Diagnosis not present

## 2020-03-24 DIAGNOSIS — Z9981 Dependence on supplemental oxygen: Secondary | ICD-10-CM | POA: Diagnosis not present

## 2020-03-24 DIAGNOSIS — I10 Essential (primary) hypertension: Secondary | ICD-10-CM | POA: Diagnosis not present

## 2020-03-24 DIAGNOSIS — Z7952 Long term (current) use of systemic steroids: Secondary | ICD-10-CM | POA: Diagnosis not present

## 2020-03-24 DIAGNOSIS — G8929 Other chronic pain: Secondary | ICD-10-CM | POA: Diagnosis not present

## 2020-03-24 DIAGNOSIS — J441 Chronic obstructive pulmonary disease with (acute) exacerbation: Secondary | ICD-10-CM | POA: Diagnosis not present

## 2020-03-24 DIAGNOSIS — E220 Acromegaly and pituitary gigantism: Secondary | ICD-10-CM | POA: Diagnosis not present

## 2020-03-25 DIAGNOSIS — G8929 Other chronic pain: Secondary | ICD-10-CM | POA: Diagnosis not present

## 2020-03-25 DIAGNOSIS — J45901 Unspecified asthma with (acute) exacerbation: Secondary | ICD-10-CM | POA: Diagnosis not present

## 2020-03-25 DIAGNOSIS — Z792 Long term (current) use of antibiotics: Secondary | ICD-10-CM | POA: Diagnosis not present

## 2020-03-25 DIAGNOSIS — Z794 Long term (current) use of insulin: Secondary | ICD-10-CM | POA: Diagnosis not present

## 2020-03-25 DIAGNOSIS — E119 Type 2 diabetes mellitus without complications: Secondary | ICD-10-CM | POA: Diagnosis not present

## 2020-03-25 DIAGNOSIS — E039 Hypothyroidism, unspecified: Secondary | ICD-10-CM | POA: Diagnosis not present

## 2020-03-25 DIAGNOSIS — J439 Emphysema, unspecified: Secondary | ICD-10-CM | POA: Diagnosis not present

## 2020-03-25 DIAGNOSIS — J9612 Chronic respiratory failure with hypercapnia: Secondary | ICD-10-CM | POA: Diagnosis not present

## 2020-03-25 DIAGNOSIS — Z9981 Dependence on supplemental oxygen: Secondary | ICD-10-CM | POA: Diagnosis not present

## 2020-03-25 DIAGNOSIS — Z5181 Encounter for therapeutic drug level monitoring: Secondary | ICD-10-CM | POA: Diagnosis not present

## 2020-03-25 DIAGNOSIS — Z87440 Personal history of urinary (tract) infections: Secondary | ICD-10-CM | POA: Diagnosis not present

## 2020-03-25 DIAGNOSIS — E220 Acromegaly and pituitary gigantism: Secondary | ICD-10-CM | POA: Diagnosis not present

## 2020-03-25 DIAGNOSIS — J9611 Chronic respiratory failure with hypoxia: Secondary | ICD-10-CM | POA: Diagnosis not present

## 2020-03-25 DIAGNOSIS — I10 Essential (primary) hypertension: Secondary | ICD-10-CM | POA: Diagnosis not present

## 2020-03-25 DIAGNOSIS — J441 Chronic obstructive pulmonary disease with (acute) exacerbation: Secondary | ICD-10-CM | POA: Diagnosis not present

## 2020-03-25 DIAGNOSIS — Z7952 Long term (current) use of systemic steroids: Secondary | ICD-10-CM | POA: Diagnosis not present

## 2020-03-25 DIAGNOSIS — M009 Pyogenic arthritis, unspecified: Secondary | ICD-10-CM | POA: Diagnosis not present

## 2020-03-26 DIAGNOSIS — J441 Chronic obstructive pulmonary disease with (acute) exacerbation: Secondary | ICD-10-CM | POA: Diagnosis not present

## 2020-03-26 DIAGNOSIS — J45901 Unspecified asthma with (acute) exacerbation: Secondary | ICD-10-CM | POA: Diagnosis not present

## 2020-03-27 DIAGNOSIS — Z9981 Dependence on supplemental oxygen: Secondary | ICD-10-CM | POA: Diagnosis not present

## 2020-03-27 DIAGNOSIS — Z87440 Personal history of urinary (tract) infections: Secondary | ICD-10-CM | POA: Diagnosis not present

## 2020-03-27 DIAGNOSIS — I10 Essential (primary) hypertension: Secondary | ICD-10-CM | POA: Diagnosis not present

## 2020-03-27 DIAGNOSIS — E220 Acromegaly and pituitary gigantism: Secondary | ICD-10-CM | POA: Diagnosis not present

## 2020-03-27 DIAGNOSIS — M1480 Arthropathies in other specified diseases classified elsewhere, unspecified site: Secondary | ICD-10-CM | POA: Diagnosis not present

## 2020-03-27 DIAGNOSIS — J45901 Unspecified asthma with (acute) exacerbation: Secondary | ICD-10-CM | POA: Diagnosis not present

## 2020-03-27 DIAGNOSIS — J449 Chronic obstructive pulmonary disease, unspecified: Secondary | ICD-10-CM | POA: Diagnosis not present

## 2020-03-27 DIAGNOSIS — Z5181 Encounter for therapeutic drug level monitoring: Secondary | ICD-10-CM | POA: Diagnosis not present

## 2020-03-27 DIAGNOSIS — E11618 Type 2 diabetes mellitus with other diabetic arthropathy: Secondary | ICD-10-CM | POA: Diagnosis not present

## 2020-03-27 DIAGNOSIS — E039 Hypothyroidism, unspecified: Secondary | ICD-10-CM | POA: Diagnosis not present

## 2020-03-27 DIAGNOSIS — M009 Pyogenic arthritis, unspecified: Secondary | ICD-10-CM | POA: Diagnosis not present

## 2020-03-27 DIAGNOSIS — J9611 Chronic respiratory failure with hypoxia: Secondary | ICD-10-CM | POA: Diagnosis not present

## 2020-03-27 DIAGNOSIS — G8929 Other chronic pain: Secondary | ICD-10-CM | POA: Diagnosis not present

## 2020-03-27 DIAGNOSIS — J439 Emphysema, unspecified: Secondary | ICD-10-CM | POA: Diagnosis not present

## 2020-03-27 DIAGNOSIS — Z7952 Long term (current) use of systemic steroids: Secondary | ICD-10-CM | POA: Diagnosis not present

## 2020-03-27 DIAGNOSIS — J9612 Chronic respiratory failure with hypercapnia: Secondary | ICD-10-CM | POA: Diagnosis not present

## 2020-03-27 DIAGNOSIS — Z792 Long term (current) use of antibiotics: Secondary | ICD-10-CM | POA: Diagnosis not present

## 2020-03-27 DIAGNOSIS — E119 Type 2 diabetes mellitus without complications: Secondary | ICD-10-CM | POA: Diagnosis not present

## 2020-03-27 DIAGNOSIS — Z794 Long term (current) use of insulin: Secondary | ICD-10-CM | POA: Diagnosis not present

## 2020-03-27 DIAGNOSIS — J441 Chronic obstructive pulmonary disease with (acute) exacerbation: Secondary | ICD-10-CM | POA: Diagnosis not present

## 2020-03-28 DIAGNOSIS — J441 Chronic obstructive pulmonary disease with (acute) exacerbation: Secondary | ICD-10-CM | POA: Diagnosis not present

## 2020-03-28 DIAGNOSIS — J45901 Unspecified asthma with (acute) exacerbation: Secondary | ICD-10-CM | POA: Diagnosis not present

## 2020-03-29 DIAGNOSIS — J45901 Unspecified asthma with (acute) exacerbation: Secondary | ICD-10-CM | POA: Diagnosis not present

## 2020-03-29 DIAGNOSIS — J441 Chronic obstructive pulmonary disease with (acute) exacerbation: Secondary | ICD-10-CM | POA: Diagnosis not present

## 2020-03-30 DIAGNOSIS — J45901 Unspecified asthma with (acute) exacerbation: Secondary | ICD-10-CM | POA: Diagnosis not present

## 2020-03-30 DIAGNOSIS — J441 Chronic obstructive pulmonary disease with (acute) exacerbation: Secondary | ICD-10-CM | POA: Diagnosis not present

## 2020-03-31 DIAGNOSIS — J441 Chronic obstructive pulmonary disease with (acute) exacerbation: Secondary | ICD-10-CM | POA: Diagnosis not present

## 2020-03-31 DIAGNOSIS — J45901 Unspecified asthma with (acute) exacerbation: Secondary | ICD-10-CM | POA: Diagnosis not present

## 2020-04-01 DIAGNOSIS — Z9981 Dependence on supplemental oxygen: Secondary | ICD-10-CM | POA: Diagnosis not present

## 2020-04-01 DIAGNOSIS — E039 Hypothyroidism, unspecified: Secondary | ICD-10-CM | POA: Diagnosis not present

## 2020-04-01 DIAGNOSIS — Z5181 Encounter for therapeutic drug level monitoring: Secondary | ICD-10-CM | POA: Diagnosis not present

## 2020-04-01 DIAGNOSIS — I10 Essential (primary) hypertension: Secondary | ICD-10-CM | POA: Diagnosis not present

## 2020-04-01 DIAGNOSIS — J9611 Chronic respiratory failure with hypoxia: Secondary | ICD-10-CM | POA: Diagnosis not present

## 2020-04-01 DIAGNOSIS — J45901 Unspecified asthma with (acute) exacerbation: Secondary | ICD-10-CM | POA: Diagnosis not present

## 2020-04-01 DIAGNOSIS — Z792 Long term (current) use of antibiotics: Secondary | ICD-10-CM | POA: Diagnosis not present

## 2020-04-01 DIAGNOSIS — E220 Acromegaly and pituitary gigantism: Secondary | ICD-10-CM | POA: Diagnosis not present

## 2020-04-01 DIAGNOSIS — M009 Pyogenic arthritis, unspecified: Secondary | ICD-10-CM | POA: Diagnosis not present

## 2020-04-01 DIAGNOSIS — E119 Type 2 diabetes mellitus without complications: Secondary | ICD-10-CM | POA: Diagnosis not present

## 2020-04-01 DIAGNOSIS — Z87440 Personal history of urinary (tract) infections: Secondary | ICD-10-CM | POA: Diagnosis not present

## 2020-04-01 DIAGNOSIS — G8929 Other chronic pain: Secondary | ICD-10-CM | POA: Diagnosis not present

## 2020-04-01 DIAGNOSIS — J9612 Chronic respiratory failure with hypercapnia: Secondary | ICD-10-CM | POA: Diagnosis not present

## 2020-04-01 DIAGNOSIS — J441 Chronic obstructive pulmonary disease with (acute) exacerbation: Secondary | ICD-10-CM | POA: Diagnosis not present

## 2020-04-01 DIAGNOSIS — Z7952 Long term (current) use of systemic steroids: Secondary | ICD-10-CM | POA: Diagnosis not present

## 2020-04-01 DIAGNOSIS — Z794 Long term (current) use of insulin: Secondary | ICD-10-CM | POA: Diagnosis not present

## 2020-04-01 DIAGNOSIS — J439 Emphysema, unspecified: Secondary | ICD-10-CM | POA: Diagnosis not present

## 2020-04-02 DIAGNOSIS — J45901 Unspecified asthma with (acute) exacerbation: Secondary | ICD-10-CM | POA: Diagnosis not present

## 2020-04-02 DIAGNOSIS — J9612 Chronic respiratory failure with hypercapnia: Secondary | ICD-10-CM | POA: Diagnosis not present

## 2020-04-02 DIAGNOSIS — E119 Type 2 diabetes mellitus without complications: Secondary | ICD-10-CM | POA: Diagnosis not present

## 2020-04-02 DIAGNOSIS — Z5181 Encounter for therapeutic drug level monitoring: Secondary | ICD-10-CM | POA: Diagnosis not present

## 2020-04-02 DIAGNOSIS — J9611 Chronic respiratory failure with hypoxia: Secondary | ICD-10-CM | POA: Diagnosis not present

## 2020-04-02 DIAGNOSIS — E220 Acromegaly and pituitary gigantism: Secondary | ICD-10-CM | POA: Diagnosis not present

## 2020-04-02 DIAGNOSIS — M009 Pyogenic arthritis, unspecified: Secondary | ICD-10-CM | POA: Diagnosis not present

## 2020-04-02 DIAGNOSIS — Z9981 Dependence on supplemental oxygen: Secondary | ICD-10-CM | POA: Diagnosis not present

## 2020-04-02 DIAGNOSIS — I10 Essential (primary) hypertension: Secondary | ICD-10-CM | POA: Diagnosis not present

## 2020-04-02 DIAGNOSIS — J441 Chronic obstructive pulmonary disease with (acute) exacerbation: Secondary | ICD-10-CM | POA: Diagnosis not present

## 2020-04-02 DIAGNOSIS — J439 Emphysema, unspecified: Secondary | ICD-10-CM | POA: Diagnosis not present

## 2020-04-02 DIAGNOSIS — Z794 Long term (current) use of insulin: Secondary | ICD-10-CM | POA: Diagnosis not present

## 2020-04-02 DIAGNOSIS — Z87440 Personal history of urinary (tract) infections: Secondary | ICD-10-CM | POA: Diagnosis not present

## 2020-04-02 DIAGNOSIS — G8929 Other chronic pain: Secondary | ICD-10-CM | POA: Diagnosis not present

## 2020-04-02 DIAGNOSIS — Z7952 Long term (current) use of systemic steroids: Secondary | ICD-10-CM | POA: Diagnosis not present

## 2020-04-02 DIAGNOSIS — Z792 Long term (current) use of antibiotics: Secondary | ICD-10-CM | POA: Diagnosis not present

## 2020-04-02 DIAGNOSIS — E039 Hypothyroidism, unspecified: Secondary | ICD-10-CM | POA: Diagnosis not present

## 2020-04-03 DIAGNOSIS — J441 Chronic obstructive pulmonary disease with (acute) exacerbation: Secondary | ICD-10-CM | POA: Diagnosis not present

## 2020-04-03 DIAGNOSIS — E119 Type 2 diabetes mellitus without complications: Secondary | ICD-10-CM | POA: Diagnosis not present

## 2020-04-03 DIAGNOSIS — Z5181 Encounter for therapeutic drug level monitoring: Secondary | ICD-10-CM | POA: Diagnosis not present

## 2020-04-03 DIAGNOSIS — Z87440 Personal history of urinary (tract) infections: Secondary | ICD-10-CM | POA: Diagnosis not present

## 2020-04-03 DIAGNOSIS — J9611 Chronic respiratory failure with hypoxia: Secondary | ICD-10-CM | POA: Diagnosis not present

## 2020-04-03 DIAGNOSIS — I10 Essential (primary) hypertension: Secondary | ICD-10-CM | POA: Diagnosis not present

## 2020-04-03 DIAGNOSIS — J45901 Unspecified asthma with (acute) exacerbation: Secondary | ICD-10-CM | POA: Diagnosis not present

## 2020-04-03 DIAGNOSIS — M009 Pyogenic arthritis, unspecified: Secondary | ICD-10-CM | POA: Diagnosis not present

## 2020-04-03 DIAGNOSIS — Z9981 Dependence on supplemental oxygen: Secondary | ICD-10-CM | POA: Diagnosis not present

## 2020-04-03 DIAGNOSIS — J439 Emphysema, unspecified: Secondary | ICD-10-CM | POA: Diagnosis not present

## 2020-04-03 DIAGNOSIS — Z792 Long term (current) use of antibiotics: Secondary | ICD-10-CM | POA: Diagnosis not present

## 2020-04-03 DIAGNOSIS — J9612 Chronic respiratory failure with hypercapnia: Secondary | ICD-10-CM | POA: Diagnosis not present

## 2020-04-03 DIAGNOSIS — E039 Hypothyroidism, unspecified: Secondary | ICD-10-CM | POA: Diagnosis not present

## 2020-04-03 DIAGNOSIS — E220 Acromegaly and pituitary gigantism: Secondary | ICD-10-CM | POA: Diagnosis not present

## 2020-04-03 DIAGNOSIS — Z7952 Long term (current) use of systemic steroids: Secondary | ICD-10-CM | POA: Diagnosis not present

## 2020-04-03 DIAGNOSIS — G8929 Other chronic pain: Secondary | ICD-10-CM | POA: Diagnosis not present

## 2020-04-03 DIAGNOSIS — Z794 Long term (current) use of insulin: Secondary | ICD-10-CM | POA: Diagnosis not present

## 2020-04-04 DIAGNOSIS — J45901 Unspecified asthma with (acute) exacerbation: Secondary | ICD-10-CM | POA: Diagnosis not present

## 2020-04-04 DIAGNOSIS — J441 Chronic obstructive pulmonary disease with (acute) exacerbation: Secondary | ICD-10-CM | POA: Diagnosis not present

## 2020-04-05 DIAGNOSIS — J45901 Unspecified asthma with (acute) exacerbation: Secondary | ICD-10-CM | POA: Diagnosis not present

## 2020-04-05 DIAGNOSIS — J441 Chronic obstructive pulmonary disease with (acute) exacerbation: Secondary | ICD-10-CM | POA: Diagnosis not present

## 2020-04-06 DIAGNOSIS — J45901 Unspecified asthma with (acute) exacerbation: Secondary | ICD-10-CM | POA: Diagnosis not present

## 2020-04-06 DIAGNOSIS — J441 Chronic obstructive pulmonary disease with (acute) exacerbation: Secondary | ICD-10-CM | POA: Diagnosis not present

## 2020-04-07 DIAGNOSIS — J441 Chronic obstructive pulmonary disease with (acute) exacerbation: Secondary | ICD-10-CM | POA: Diagnosis not present

## 2020-04-07 DIAGNOSIS — J45901 Unspecified asthma with (acute) exacerbation: Secondary | ICD-10-CM | POA: Diagnosis not present

## 2020-04-08 DIAGNOSIS — J439 Emphysema, unspecified: Secondary | ICD-10-CM | POA: Diagnosis not present

## 2020-04-08 DIAGNOSIS — J441 Chronic obstructive pulmonary disease with (acute) exacerbation: Secondary | ICD-10-CM | POA: Diagnosis not present

## 2020-04-08 DIAGNOSIS — G8929 Other chronic pain: Secondary | ICD-10-CM | POA: Diagnosis not present

## 2020-04-08 DIAGNOSIS — Z7952 Long term (current) use of systemic steroids: Secondary | ICD-10-CM | POA: Diagnosis not present

## 2020-04-08 DIAGNOSIS — I10 Essential (primary) hypertension: Secondary | ICD-10-CM | POA: Diagnosis not present

## 2020-04-08 DIAGNOSIS — Z87440 Personal history of urinary (tract) infections: Secondary | ICD-10-CM | POA: Diagnosis not present

## 2020-04-08 DIAGNOSIS — Z794 Long term (current) use of insulin: Secondary | ICD-10-CM | POA: Diagnosis not present

## 2020-04-08 DIAGNOSIS — E11618 Type 2 diabetes mellitus with other diabetic arthropathy: Secondary | ICD-10-CM | POA: Diagnosis not present

## 2020-04-08 DIAGNOSIS — J9611 Chronic respiratory failure with hypoxia: Secondary | ICD-10-CM | POA: Diagnosis not present

## 2020-04-08 DIAGNOSIS — J45901 Unspecified asthma with (acute) exacerbation: Secondary | ICD-10-CM | POA: Diagnosis not present

## 2020-04-08 DIAGNOSIS — Z792 Long term (current) use of antibiotics: Secondary | ICD-10-CM | POA: Diagnosis not present

## 2020-04-08 DIAGNOSIS — E119 Type 2 diabetes mellitus without complications: Secondary | ICD-10-CM | POA: Diagnosis not present

## 2020-04-08 DIAGNOSIS — E220 Acromegaly and pituitary gigantism: Secondary | ICD-10-CM | POA: Diagnosis not present

## 2020-04-08 DIAGNOSIS — M2550 Pain in unspecified joint: Secondary | ICD-10-CM | POA: Diagnosis not present

## 2020-04-08 DIAGNOSIS — M009 Pyogenic arthritis, unspecified: Secondary | ICD-10-CM | POA: Diagnosis not present

## 2020-04-08 DIAGNOSIS — M1480 Arthropathies in other specified diseases classified elsewhere, unspecified site: Secondary | ICD-10-CM | POA: Diagnosis not present

## 2020-04-08 DIAGNOSIS — E039 Hypothyroidism, unspecified: Secondary | ICD-10-CM | POA: Diagnosis not present

## 2020-04-08 DIAGNOSIS — Z5181 Encounter for therapeutic drug level monitoring: Secondary | ICD-10-CM | POA: Diagnosis not present

## 2020-04-08 DIAGNOSIS — J9612 Chronic respiratory failure with hypercapnia: Secondary | ICD-10-CM | POA: Diagnosis not present

## 2020-04-08 DIAGNOSIS — Z9981 Dependence on supplemental oxygen: Secondary | ICD-10-CM | POA: Diagnosis not present

## 2020-04-11 DIAGNOSIS — E220 Acromegaly and pituitary gigantism: Secondary | ICD-10-CM | POA: Diagnosis not present

## 2020-04-11 DIAGNOSIS — Z9981 Dependence on supplemental oxygen: Secondary | ICD-10-CM | POA: Diagnosis not present

## 2020-04-11 DIAGNOSIS — Z5181 Encounter for therapeutic drug level monitoring: Secondary | ICD-10-CM | POA: Diagnosis not present

## 2020-04-11 DIAGNOSIS — M009 Pyogenic arthritis, unspecified: Secondary | ICD-10-CM | POA: Diagnosis not present

## 2020-04-11 DIAGNOSIS — I10 Essential (primary) hypertension: Secondary | ICD-10-CM | POA: Diagnosis not present

## 2020-04-11 DIAGNOSIS — J9611 Chronic respiratory failure with hypoxia: Secondary | ICD-10-CM | POA: Diagnosis not present

## 2020-04-11 DIAGNOSIS — Z792 Long term (current) use of antibiotics: Secondary | ICD-10-CM | POA: Diagnosis not present

## 2020-04-11 DIAGNOSIS — E119 Type 2 diabetes mellitus without complications: Secondary | ICD-10-CM | POA: Diagnosis not present

## 2020-04-11 DIAGNOSIS — Z87440 Personal history of urinary (tract) infections: Secondary | ICD-10-CM | POA: Diagnosis not present

## 2020-04-11 DIAGNOSIS — G8929 Other chronic pain: Secondary | ICD-10-CM | POA: Diagnosis not present

## 2020-04-11 DIAGNOSIS — Z794 Long term (current) use of insulin: Secondary | ICD-10-CM | POA: Diagnosis not present

## 2020-04-11 DIAGNOSIS — J439 Emphysema, unspecified: Secondary | ICD-10-CM | POA: Diagnosis not present

## 2020-04-11 DIAGNOSIS — J9612 Chronic respiratory failure with hypercapnia: Secondary | ICD-10-CM | POA: Diagnosis not present

## 2020-04-11 DIAGNOSIS — E039 Hypothyroidism, unspecified: Secondary | ICD-10-CM | POA: Diagnosis not present

## 2020-04-11 DIAGNOSIS — Z7952 Long term (current) use of systemic steroids: Secondary | ICD-10-CM | POA: Diagnosis not present

## 2020-04-16 DIAGNOSIS — J449 Chronic obstructive pulmonary disease, unspecified: Secondary | ICD-10-CM | POA: Diagnosis not present

## 2020-04-25 DIAGNOSIS — J449 Chronic obstructive pulmonary disease, unspecified: Secondary | ICD-10-CM | POA: Diagnosis not present

## 2020-05-02 DIAGNOSIS — M00832 Arthritis due to other bacteria, left wrist: Secondary | ICD-10-CM | POA: Diagnosis not present

## 2020-05-02 DIAGNOSIS — E039 Hypothyroidism, unspecified: Secondary | ICD-10-CM | POA: Diagnosis not present

## 2020-05-02 DIAGNOSIS — E119 Type 2 diabetes mellitus without complications: Secondary | ICD-10-CM | POA: Diagnosis not present

## 2020-05-02 DIAGNOSIS — Z8673 Personal history of transient ischemic attack (TIA), and cerebral infarction without residual deficits: Secondary | ICD-10-CM | POA: Diagnosis not present

## 2020-05-02 DIAGNOSIS — M1990 Unspecified osteoarthritis, unspecified site: Secondary | ICD-10-CM | POA: Diagnosis not present

## 2020-05-02 DIAGNOSIS — J449 Chronic obstructive pulmonary disease, unspecified: Secondary | ICD-10-CM | POA: Diagnosis not present

## 2020-05-02 DIAGNOSIS — S6292XA Unspecified fracture of left wrist and hand, initial encounter for closed fracture: Secondary | ICD-10-CM | POA: Diagnosis not present

## 2020-05-08 DIAGNOSIS — M2550 Pain in unspecified joint: Secondary | ICD-10-CM | POA: Diagnosis not present

## 2020-05-08 DIAGNOSIS — E559 Vitamin D deficiency, unspecified: Secondary | ICD-10-CM | POA: Diagnosis not present

## 2020-05-08 DIAGNOSIS — E11618 Type 2 diabetes mellitus with other diabetic arthropathy: Secondary | ICD-10-CM | POA: Diagnosis not present

## 2020-05-08 DIAGNOSIS — E039 Hypothyroidism, unspecified: Secondary | ICD-10-CM | POA: Diagnosis not present

## 2020-05-08 DIAGNOSIS — Z79899 Other long term (current) drug therapy: Secondary | ICD-10-CM | POA: Diagnosis not present

## 2020-05-08 DIAGNOSIS — E220 Acromegaly and pituitary gigantism: Secondary | ICD-10-CM | POA: Diagnosis not present

## 2020-05-08 DIAGNOSIS — K219 Gastro-esophageal reflux disease without esophagitis: Secondary | ICD-10-CM | POA: Diagnosis not present

## 2020-05-13 DIAGNOSIS — E11618 Type 2 diabetes mellitus with other diabetic arthropathy: Secondary | ICD-10-CM | POA: Diagnosis not present

## 2020-05-13 DIAGNOSIS — J449 Chronic obstructive pulmonary disease, unspecified: Secondary | ICD-10-CM | POA: Diagnosis not present

## 2020-05-13 DIAGNOSIS — Z9981 Dependence on supplemental oxygen: Secondary | ICD-10-CM | POA: Diagnosis not present

## 2020-05-13 DIAGNOSIS — J9612 Chronic respiratory failure with hypercapnia: Secondary | ICD-10-CM | POA: Diagnosis not present

## 2020-05-16 DIAGNOSIS — J449 Chronic obstructive pulmonary disease, unspecified: Secondary | ICD-10-CM | POA: Diagnosis not present

## 2020-05-21 DIAGNOSIS — E220 Acromegaly and pituitary gigantism: Secondary | ICD-10-CM | POA: Diagnosis not present

## 2020-05-21 DIAGNOSIS — R0602 Shortness of breath: Secondary | ICD-10-CM | POA: Diagnosis not present

## 2020-05-21 DIAGNOSIS — J449 Chronic obstructive pulmonary disease, unspecified: Secondary | ICD-10-CM | POA: Diagnosis not present

## 2020-05-21 DIAGNOSIS — G4733 Obstructive sleep apnea (adult) (pediatric): Secondary | ICD-10-CM | POA: Diagnosis not present

## 2020-05-21 DIAGNOSIS — R0600 Dyspnea, unspecified: Secondary | ICD-10-CM | POA: Diagnosis not present

## 2020-05-21 DIAGNOSIS — F17218 Nicotine dependence, cigarettes, with other nicotine-induced disorders: Secondary | ICD-10-CM | POA: Diagnosis not present

## 2020-05-22 DIAGNOSIS — J439 Emphysema, unspecified: Secondary | ICD-10-CM | POA: Diagnosis not present

## 2020-05-22 DIAGNOSIS — E119 Type 2 diabetes mellitus without complications: Secondary | ICD-10-CM | POA: Diagnosis not present

## 2020-05-22 DIAGNOSIS — M009 Pyogenic arthritis, unspecified: Secondary | ICD-10-CM | POA: Diagnosis not present

## 2020-05-22 DIAGNOSIS — J9612 Chronic respiratory failure with hypercapnia: Secondary | ICD-10-CM | POA: Diagnosis not present

## 2020-05-22 DIAGNOSIS — J9611 Chronic respiratory failure with hypoxia: Secondary | ICD-10-CM | POA: Diagnosis not present

## 2020-06-07 DIAGNOSIS — Z91041 Radiographic dye allergy status: Secondary | ICD-10-CM | POA: Diagnosis not present

## 2020-06-07 DIAGNOSIS — M064 Inflammatory polyarthropathy: Secondary | ICD-10-CM | POA: Diagnosis not present

## 2020-06-07 DIAGNOSIS — R0602 Shortness of breath: Secondary | ICD-10-CM | POA: Diagnosis not present

## 2020-06-07 DIAGNOSIS — J449 Chronic obstructive pulmonary disease, unspecified: Secondary | ICD-10-CM | POA: Diagnosis not present

## 2020-06-07 DIAGNOSIS — J441 Chronic obstructive pulmonary disease with (acute) exacerbation: Secondary | ICD-10-CM | POA: Diagnosis not present

## 2020-06-07 DIAGNOSIS — Z20822 Contact with and (suspected) exposure to covid-19: Secondary | ICD-10-CM | POA: Diagnosis not present

## 2020-06-07 DIAGNOSIS — Z833 Family history of diabetes mellitus: Secondary | ICD-10-CM | POA: Diagnosis not present

## 2020-06-07 DIAGNOSIS — Z882 Allergy status to sulfonamides status: Secondary | ICD-10-CM | POA: Diagnosis not present

## 2020-06-07 DIAGNOSIS — R05 Cough: Secondary | ICD-10-CM | POA: Diagnosis not present

## 2020-06-07 DIAGNOSIS — Z885 Allergy status to narcotic agent status: Secondary | ICD-10-CM | POA: Diagnosis not present

## 2020-06-07 DIAGNOSIS — E039 Hypothyroidism, unspecified: Secondary | ICD-10-CM | POA: Diagnosis not present

## 2020-06-07 DIAGNOSIS — I10 Essential (primary) hypertension: Secondary | ICD-10-CM | POA: Diagnosis not present

## 2020-06-07 DIAGNOSIS — E220 Acromegaly and pituitary gigantism: Secondary | ICD-10-CM | POA: Diagnosis not present

## 2020-06-07 DIAGNOSIS — Z7952 Long term (current) use of systemic steroids: Secondary | ICD-10-CM | POA: Diagnosis not present

## 2020-06-07 DIAGNOSIS — K219 Gastro-esophageal reflux disease without esophagitis: Secondary | ICD-10-CM | POA: Diagnosis not present

## 2020-06-07 DIAGNOSIS — Z794 Long term (current) use of insulin: Secondary | ICD-10-CM | POA: Diagnosis not present

## 2020-06-07 DIAGNOSIS — R000 Tachycardia, unspecified: Secondary | ICD-10-CM | POA: Diagnosis not present

## 2020-06-07 DIAGNOSIS — E1165 Type 2 diabetes mellitus with hyperglycemia: Secondary | ICD-10-CM | POA: Diagnosis not present

## 2020-06-07 DIAGNOSIS — E119 Type 2 diabetes mellitus without complications: Secondary | ICD-10-CM | POA: Diagnosis not present

## 2020-06-07 DIAGNOSIS — Z79899 Other long term (current) drug therapy: Secondary | ICD-10-CM | POA: Diagnosis not present

## 2020-06-08 DIAGNOSIS — Z794 Long term (current) use of insulin: Secondary | ICD-10-CM | POA: Diagnosis not present

## 2020-06-08 DIAGNOSIS — E1165 Type 2 diabetes mellitus with hyperglycemia: Secondary | ICD-10-CM | POA: Diagnosis not present

## 2020-06-08 DIAGNOSIS — E220 Acromegaly and pituitary gigantism: Secondary | ICD-10-CM | POA: Diagnosis not present

## 2020-06-08 DIAGNOSIS — Z9981 Dependence on supplemental oxygen: Secondary | ICD-10-CM | POA: Diagnosis not present

## 2020-06-08 DIAGNOSIS — J441 Chronic obstructive pulmonary disease with (acute) exacerbation: Secondary | ICD-10-CM | POA: Diagnosis not present

## 2020-06-09 DIAGNOSIS — M1990 Unspecified osteoarthritis, unspecified site: Secondary | ICD-10-CM | POA: Diagnosis not present

## 2020-06-09 DIAGNOSIS — E1165 Type 2 diabetes mellitus with hyperglycemia: Secondary | ICD-10-CM | POA: Diagnosis not present

## 2020-06-09 DIAGNOSIS — J441 Chronic obstructive pulmonary disease with (acute) exacerbation: Secondary | ICD-10-CM | POA: Diagnosis not present

## 2020-06-09 DIAGNOSIS — Z9981 Dependence on supplemental oxygen: Secondary | ICD-10-CM | POA: Diagnosis not present

## 2020-06-09 DIAGNOSIS — E220 Acromegaly and pituitary gigantism: Secondary | ICD-10-CM | POA: Diagnosis not present

## 2020-06-10 DIAGNOSIS — E039 Hypothyroidism, unspecified: Secondary | ICD-10-CM | POA: Diagnosis not present

## 2020-06-10 DIAGNOSIS — J441 Chronic obstructive pulmonary disease with (acute) exacerbation: Secondary | ICD-10-CM | POA: Diagnosis not present

## 2020-06-10 DIAGNOSIS — E220 Acromegaly and pituitary gigantism: Secondary | ICD-10-CM | POA: Diagnosis not present

## 2020-06-10 DIAGNOSIS — K219 Gastro-esophageal reflux disease without esophagitis: Secondary | ICD-10-CM | POA: Diagnosis not present

## 2020-06-10 DIAGNOSIS — E119 Type 2 diabetes mellitus without complications: Secondary | ICD-10-CM | POA: Diagnosis not present

## 2020-06-13 DIAGNOSIS — J449 Chronic obstructive pulmonary disease, unspecified: Secondary | ICD-10-CM | POA: Diagnosis not present

## 2020-06-16 DIAGNOSIS — J449 Chronic obstructive pulmonary disease, unspecified: Secondary | ICD-10-CM | POA: Diagnosis not present

## 2020-07-08 DIAGNOSIS — J449 Chronic obstructive pulmonary disease, unspecified: Secondary | ICD-10-CM | POA: Diagnosis not present

## 2020-07-17 DIAGNOSIS — J449 Chronic obstructive pulmonary disease, unspecified: Secondary | ICD-10-CM | POA: Diagnosis not present

## 2020-07-22 DIAGNOSIS — R0600 Dyspnea, unspecified: Secondary | ICD-10-CM | POA: Diagnosis not present

## 2020-07-22 DIAGNOSIS — R05 Cough: Secondary | ICD-10-CM | POA: Diagnosis not present

## 2020-07-22 DIAGNOSIS — J209 Acute bronchitis, unspecified: Secondary | ICD-10-CM | POA: Diagnosis not present

## 2020-07-22 DIAGNOSIS — J449 Chronic obstructive pulmonary disease, unspecified: Secondary | ICD-10-CM | POA: Diagnosis not present

## 2020-08-01 DIAGNOSIS — J449 Chronic obstructive pulmonary disease, unspecified: Secondary | ICD-10-CM | POA: Diagnosis not present

## 2020-08-08 DIAGNOSIS — E785 Hyperlipidemia, unspecified: Secondary | ICD-10-CM | POA: Diagnosis not present

## 2020-08-08 DIAGNOSIS — Z9181 History of falling: Secondary | ICD-10-CM | POA: Diagnosis not present

## 2020-08-08 DIAGNOSIS — Z0000 Encounter for general adult medical examination without abnormal findings: Secondary | ICD-10-CM | POA: Diagnosis not present

## 2020-08-08 DIAGNOSIS — Z87891 Personal history of nicotine dependence: Secondary | ICD-10-CM | POA: Diagnosis not present

## 2020-08-12 DIAGNOSIS — M1480 Arthropathies in other specified diseases classified elsewhere, unspecified site: Secondary | ICD-10-CM | POA: Diagnosis not present

## 2020-08-12 DIAGNOSIS — E220 Acromegaly and pituitary gigantism: Secondary | ICD-10-CM | POA: Diagnosis not present

## 2020-08-12 DIAGNOSIS — Z23 Encounter for immunization: Secondary | ICD-10-CM | POA: Diagnosis not present

## 2020-08-12 DIAGNOSIS — Z79899 Other long term (current) drug therapy: Secondary | ICD-10-CM | POA: Diagnosis not present

## 2020-08-12 DIAGNOSIS — M4807 Spinal stenosis, lumbosacral region: Secondary | ICD-10-CM | POA: Diagnosis not present

## 2020-08-12 DIAGNOSIS — M2550 Pain in unspecified joint: Secondary | ICD-10-CM | POA: Diagnosis not present

## 2020-08-15 DIAGNOSIS — J45901 Unspecified asthma with (acute) exacerbation: Secondary | ICD-10-CM | POA: Diagnosis not present

## 2020-08-15 DIAGNOSIS — J441 Chronic obstructive pulmonary disease with (acute) exacerbation: Secondary | ICD-10-CM | POA: Diagnosis not present

## 2020-08-16 DIAGNOSIS — J441 Chronic obstructive pulmonary disease with (acute) exacerbation: Secondary | ICD-10-CM | POA: Diagnosis not present

## 2020-08-16 DIAGNOSIS — J449 Chronic obstructive pulmonary disease, unspecified: Secondary | ICD-10-CM | POA: Diagnosis not present

## 2020-08-22 DIAGNOSIS — R0602 Shortness of breath: Secondary | ICD-10-CM | POA: Diagnosis not present

## 2020-08-22 DIAGNOSIS — Z79899 Other long term (current) drug therapy: Secondary | ICD-10-CM | POA: Diagnosis not present

## 2020-08-22 DIAGNOSIS — F17210 Nicotine dependence, cigarettes, uncomplicated: Secondary | ICD-10-CM | POA: Diagnosis not present

## 2020-08-22 DIAGNOSIS — Z881 Allergy status to other antibiotic agents status: Secondary | ICD-10-CM | POA: Diagnosis not present

## 2020-08-22 DIAGNOSIS — Z885 Allergy status to narcotic agent status: Secondary | ICD-10-CM | POA: Diagnosis not present

## 2020-08-22 DIAGNOSIS — I10 Essential (primary) hypertension: Secondary | ICD-10-CM | POA: Diagnosis not present

## 2020-08-22 DIAGNOSIS — E119 Type 2 diabetes mellitus without complications: Secondary | ICD-10-CM | POA: Diagnosis not present

## 2020-08-22 DIAGNOSIS — Z794 Long term (current) use of insulin: Secondary | ICD-10-CM | POA: Diagnosis not present

## 2020-08-22 DIAGNOSIS — Z91041 Radiographic dye allergy status: Secondary | ICD-10-CM | POA: Diagnosis not present

## 2020-08-22 DIAGNOSIS — L03113 Cellulitis of right upper limb: Secondary | ICD-10-CM | POA: Diagnosis not present

## 2020-08-22 DIAGNOSIS — Z9049 Acquired absence of other specified parts of digestive tract: Secondary | ICD-10-CM | POA: Diagnosis not present

## 2020-08-22 DIAGNOSIS — M069 Rheumatoid arthritis, unspecified: Secondary | ICD-10-CM | POA: Diagnosis not present

## 2020-08-22 DIAGNOSIS — Z882 Allergy status to sulfonamides status: Secondary | ICD-10-CM | POA: Diagnosis not present

## 2020-08-22 DIAGNOSIS — J441 Chronic obstructive pulmonary disease with (acute) exacerbation: Secondary | ICD-10-CM | POA: Diagnosis not present

## 2020-08-22 DIAGNOSIS — E039 Hypothyroidism, unspecified: Secondary | ICD-10-CM | POA: Diagnosis not present

## 2020-08-26 DIAGNOSIS — J449 Chronic obstructive pulmonary disease, unspecified: Secondary | ICD-10-CM | POA: Diagnosis not present

## 2020-09-16 DIAGNOSIS — J441 Chronic obstructive pulmonary disease with (acute) exacerbation: Secondary | ICD-10-CM | POA: Diagnosis not present

## 2020-09-16 DIAGNOSIS — J449 Chronic obstructive pulmonary disease, unspecified: Secondary | ICD-10-CM | POA: Diagnosis not present

## 2020-09-18 DIAGNOSIS — J449 Chronic obstructive pulmonary disease, unspecified: Secondary | ICD-10-CM | POA: Diagnosis not present

## 2020-10-13 DIAGNOSIS — J449 Chronic obstructive pulmonary disease, unspecified: Secondary | ICD-10-CM | POA: Diagnosis not present

## 2020-10-16 DIAGNOSIS — J449 Chronic obstructive pulmonary disease, unspecified: Secondary | ICD-10-CM | POA: Diagnosis not present

## 2020-10-16 DIAGNOSIS — J441 Chronic obstructive pulmonary disease with (acute) exacerbation: Secondary | ICD-10-CM | POA: Diagnosis not present

## 2020-10-31 DIAGNOSIS — R778 Other specified abnormalities of plasma proteins: Secondary | ICD-10-CM | POA: Diagnosis not present

## 2020-10-31 DIAGNOSIS — R079 Chest pain, unspecified: Secondary | ICD-10-CM | POA: Diagnosis not present

## 2020-10-31 DIAGNOSIS — Z9989 Dependence on other enabling machines and devices: Secondary | ICD-10-CM | POA: Diagnosis not present

## 2020-10-31 DIAGNOSIS — R0689 Other abnormalities of breathing: Secondary | ICD-10-CM | POA: Diagnosis not present

## 2020-10-31 DIAGNOSIS — I214 Non-ST elevation (NSTEMI) myocardial infarction: Secondary | ICD-10-CM | POA: Diagnosis not present

## 2020-10-31 DIAGNOSIS — Z885 Allergy status to narcotic agent status: Secondary | ICD-10-CM | POA: Diagnosis not present

## 2020-10-31 DIAGNOSIS — R0600 Dyspnea, unspecified: Secondary | ICD-10-CM | POA: Diagnosis not present

## 2020-10-31 DIAGNOSIS — E039 Hypothyroidism, unspecified: Secondary | ICD-10-CM | POA: Diagnosis not present

## 2020-10-31 DIAGNOSIS — R54 Age-related physical debility: Secondary | ICD-10-CM | POA: Diagnosis not present

## 2020-10-31 DIAGNOSIS — Z886 Allergy status to analgesic agent status: Secondary | ICD-10-CM | POA: Diagnosis not present

## 2020-10-31 DIAGNOSIS — I2510 Atherosclerotic heart disease of native coronary artery without angina pectoris: Secondary | ICD-10-CM | POA: Diagnosis not present

## 2020-10-31 DIAGNOSIS — R062 Wheezing: Secondary | ICD-10-CM | POA: Diagnosis not present

## 2020-10-31 DIAGNOSIS — R0602 Shortness of breath: Secondary | ICD-10-CM | POA: Diagnosis not present

## 2020-10-31 DIAGNOSIS — J440 Chronic obstructive pulmonary disease with acute lower respiratory infection: Secondary | ICD-10-CM | POA: Diagnosis not present

## 2020-10-31 DIAGNOSIS — I517 Cardiomegaly: Secondary | ICD-10-CM | POA: Diagnosis not present

## 2020-10-31 DIAGNOSIS — J9602 Acute respiratory failure with hypercapnia: Secondary | ICD-10-CM | POA: Diagnosis not present

## 2020-10-31 DIAGNOSIS — Z882 Allergy status to sulfonamides status: Secondary | ICD-10-CM | POA: Diagnosis not present

## 2020-10-31 DIAGNOSIS — Z881 Allergy status to other antibiotic agents status: Secondary | ICD-10-CM | POA: Diagnosis not present

## 2020-10-31 DIAGNOSIS — R7989 Other specified abnormal findings of blood chemistry: Secondary | ICD-10-CM | POA: Diagnosis not present

## 2020-10-31 DIAGNOSIS — F17210 Nicotine dependence, cigarettes, uncomplicated: Secondary | ICD-10-CM | POA: Diagnosis not present

## 2020-10-31 DIAGNOSIS — Z743 Need for continuous supervision: Secondary | ICD-10-CM | POA: Diagnosis not present

## 2020-10-31 DIAGNOSIS — J441 Chronic obstructive pulmonary disease with (acute) exacerbation: Secondary | ICD-10-CM | POA: Diagnosis not present

## 2020-10-31 DIAGNOSIS — Z87440 Personal history of urinary (tract) infections: Secondary | ICD-10-CM | POA: Diagnosis not present

## 2020-10-31 DIAGNOSIS — I499 Cardiac arrhythmia, unspecified: Secondary | ICD-10-CM | POA: Diagnosis not present

## 2020-10-31 DIAGNOSIS — Z794 Long term (current) use of insulin: Secondary | ICD-10-CM | POA: Diagnosis not present

## 2020-10-31 DIAGNOSIS — Z9049 Acquired absence of other specified parts of digestive tract: Secondary | ICD-10-CM | POA: Diagnosis not present

## 2020-10-31 DIAGNOSIS — Z720 Tobacco use: Secondary | ICD-10-CM | POA: Diagnosis not present

## 2020-10-31 DIAGNOSIS — I21A1 Myocardial infarction type 2: Secondary | ICD-10-CM

## 2020-10-31 DIAGNOSIS — E119 Type 2 diabetes mellitus without complications: Secondary | ICD-10-CM | POA: Diagnosis not present

## 2020-10-31 DIAGNOSIS — Z79899 Other long term (current) drug therapy: Secondary | ICD-10-CM | POA: Diagnosis not present

## 2020-10-31 DIAGNOSIS — Z91041 Radiographic dye allergy status: Secondary | ICD-10-CM | POA: Diagnosis not present

## 2020-10-31 DIAGNOSIS — J9601 Acute respiratory failure with hypoxia: Secondary | ICD-10-CM | POA: Diagnosis not present

## 2020-10-31 DIAGNOSIS — I249 Acute ischemic heart disease, unspecified: Secondary | ICD-10-CM | POA: Diagnosis not present

## 2020-10-31 DIAGNOSIS — R9431 Abnormal electrocardiogram [ECG] [EKG]: Secondary | ICD-10-CM | POA: Diagnosis not present

## 2020-10-31 DIAGNOSIS — I4589 Other specified conduction disorders: Secondary | ICD-10-CM | POA: Diagnosis not present

## 2020-10-31 DIAGNOSIS — M7121 Synovial cyst of popliteal space [Baker], right knee: Secondary | ICD-10-CM | POA: Diagnosis not present

## 2020-10-31 DIAGNOSIS — M069 Rheumatoid arthritis, unspecified: Secondary | ICD-10-CM | POA: Diagnosis not present

## 2020-10-31 DIAGNOSIS — I10 Essential (primary) hypertension: Secondary | ICD-10-CM | POA: Diagnosis not present

## 2020-10-31 DIAGNOSIS — M7122 Synovial cyst of popliteal space [Baker], left knee: Secondary | ICD-10-CM | POA: Diagnosis not present

## 2020-10-31 DIAGNOSIS — E079 Disorder of thyroid, unspecified: Secondary | ICD-10-CM | POA: Diagnosis not present

## 2020-10-31 HISTORY — DX: Myocardial infarction type 2: I21.A1

## 2020-11-01 DIAGNOSIS — J441 Chronic obstructive pulmonary disease with (acute) exacerbation: Secondary | ICD-10-CM | POA: Diagnosis not present

## 2020-11-02 DIAGNOSIS — J441 Chronic obstructive pulmonary disease with (acute) exacerbation: Secondary | ICD-10-CM | POA: Diagnosis not present

## 2020-11-06 DIAGNOSIS — Z79899 Other long term (current) drug therapy: Secondary | ICD-10-CM | POA: Diagnosis not present

## 2020-11-06 DIAGNOSIS — M4807 Spinal stenosis, lumbosacral region: Secondary | ICD-10-CM | POA: Diagnosis not present

## 2020-11-06 DIAGNOSIS — J9612 Chronic respiratory failure with hypercapnia: Secondary | ICD-10-CM | POA: Diagnosis not present

## 2020-11-06 DIAGNOSIS — J441 Chronic obstructive pulmonary disease with (acute) exacerbation: Secondary | ICD-10-CM | POA: Diagnosis not present

## 2020-11-06 DIAGNOSIS — J449 Chronic obstructive pulmonary disease, unspecified: Secondary | ICD-10-CM | POA: Diagnosis not present

## 2020-11-06 DIAGNOSIS — E1165 Type 2 diabetes mellitus with hyperglycemia: Secondary | ICD-10-CM | POA: Diagnosis not present

## 2020-11-16 DIAGNOSIS — J441 Chronic obstructive pulmonary disease with (acute) exacerbation: Secondary | ICD-10-CM | POA: Diagnosis not present

## 2020-11-16 DIAGNOSIS — J449 Chronic obstructive pulmonary disease, unspecified: Secondary | ICD-10-CM | POA: Diagnosis not present

## 2020-11-18 DIAGNOSIS — J449 Chronic obstructive pulmonary disease, unspecified: Secondary | ICD-10-CM | POA: Diagnosis not present

## 2020-11-18 DIAGNOSIS — J9600 Acute respiratory failure, unspecified whether with hypoxia or hypercapnia: Secondary | ICD-10-CM | POA: Diagnosis not present

## 2020-11-18 DIAGNOSIS — J439 Emphysema, unspecified: Secondary | ICD-10-CM | POA: Diagnosis not present

## 2020-11-19 DIAGNOSIS — R002 Palpitations: Secondary | ICD-10-CM | POA: Diagnosis not present

## 2020-11-19 DIAGNOSIS — J9601 Acute respiratory failure with hypoxia: Secondary | ICD-10-CM | POA: Diagnosis not present

## 2020-11-19 DIAGNOSIS — D352 Benign neoplasm of pituitary gland: Secondary | ICD-10-CM | POA: Diagnosis not present

## 2020-11-19 DIAGNOSIS — R0602 Shortness of breath: Secondary | ICD-10-CM | POA: Diagnosis not present

## 2020-11-19 DIAGNOSIS — E119 Type 2 diabetes mellitus without complications: Secondary | ICD-10-CM | POA: Diagnosis not present

## 2020-11-19 DIAGNOSIS — J441 Chronic obstructive pulmonary disease with (acute) exacerbation: Secondary | ICD-10-CM | POA: Diagnosis not present

## 2020-11-19 DIAGNOSIS — E220 Acromegaly and pituitary gigantism: Secondary | ICD-10-CM | POA: Diagnosis not present

## 2020-11-19 DIAGNOSIS — J9602 Acute respiratory failure with hypercapnia: Secondary | ICD-10-CM | POA: Diagnosis not present

## 2020-11-19 DIAGNOSIS — Z794 Long term (current) use of insulin: Secondary | ICD-10-CM | POA: Diagnosis not present

## 2020-11-19 DIAGNOSIS — E7800 Pure hypercholesterolemia, unspecified: Secondary | ICD-10-CM | POA: Diagnosis not present

## 2020-11-19 DIAGNOSIS — Z720 Tobacco use: Secondary | ICD-10-CM | POA: Diagnosis not present

## 2020-11-21 DIAGNOSIS — E119 Type 2 diabetes mellitus without complications: Secondary | ICD-10-CM | POA: Diagnosis not present

## 2020-12-02 DIAGNOSIS — J449 Chronic obstructive pulmonary disease, unspecified: Secondary | ICD-10-CM | POA: Diagnosis not present

## 2020-12-17 DIAGNOSIS — J449 Chronic obstructive pulmonary disease, unspecified: Secondary | ICD-10-CM | POA: Diagnosis not present

## 2020-12-17 DIAGNOSIS — J441 Chronic obstructive pulmonary disease with (acute) exacerbation: Secondary | ICD-10-CM | POA: Diagnosis not present

## 2020-12-19 DIAGNOSIS — J439 Emphysema, unspecified: Secondary | ICD-10-CM | POA: Diagnosis not present

## 2020-12-19 DIAGNOSIS — J9600 Acute respiratory failure, unspecified whether with hypoxia or hypercapnia: Secondary | ICD-10-CM | POA: Diagnosis not present

## 2020-12-19 DIAGNOSIS — R0602 Shortness of breath: Secondary | ICD-10-CM | POA: Diagnosis not present

## 2020-12-19 DIAGNOSIS — J449 Chronic obstructive pulmonary disease, unspecified: Secondary | ICD-10-CM | POA: Diagnosis not present

## 2020-12-25 DIAGNOSIS — J449 Chronic obstructive pulmonary disease, unspecified: Secondary | ICD-10-CM | POA: Diagnosis not present

## 2020-12-30 DIAGNOSIS — J449 Chronic obstructive pulmonary disease, unspecified: Secondary | ICD-10-CM | POA: Diagnosis not present

## 2021-01-02 DIAGNOSIS — I252 Old myocardial infarction: Secondary | ICD-10-CM | POA: Diagnosis not present

## 2021-01-02 DIAGNOSIS — U071 COVID-19: Secondary | ICD-10-CM | POA: Diagnosis not present

## 2021-01-02 DIAGNOSIS — R509 Fever, unspecified: Secondary | ICD-10-CM | POA: Diagnosis not present

## 2021-01-02 DIAGNOSIS — R059 Cough, unspecified: Secondary | ICD-10-CM | POA: Diagnosis not present

## 2021-01-02 DIAGNOSIS — R0602 Shortness of breath: Secondary | ICD-10-CM | POA: Diagnosis not present

## 2021-01-02 DIAGNOSIS — I10 Essential (primary) hypertension: Secondary | ICD-10-CM | POA: Diagnosis not present

## 2021-01-02 DIAGNOSIS — E220 Acromegaly and pituitary gigantism: Secondary | ICD-10-CM | POA: Diagnosis not present

## 2021-01-02 DIAGNOSIS — E039 Hypothyroidism, unspecified: Secondary | ICD-10-CM | POA: Diagnosis not present

## 2021-01-02 DIAGNOSIS — E119 Type 2 diabetes mellitus without complications: Secondary | ICD-10-CM | POA: Diagnosis not present

## 2021-01-02 DIAGNOSIS — F17210 Nicotine dependence, cigarettes, uncomplicated: Secondary | ICD-10-CM | POA: Diagnosis not present

## 2021-01-02 DIAGNOSIS — J449 Chronic obstructive pulmonary disease, unspecified: Secondary | ICD-10-CM | POA: Diagnosis not present

## 2021-01-02 DIAGNOSIS — R7989 Other specified abnormal findings of blood chemistry: Secondary | ICD-10-CM | POA: Diagnosis not present

## 2021-01-06 DIAGNOSIS — J9612 Chronic respiratory failure with hypercapnia: Secondary | ICD-10-CM | POA: Diagnosis not present

## 2021-01-06 DIAGNOSIS — J441 Chronic obstructive pulmonary disease with (acute) exacerbation: Secondary | ICD-10-CM | POA: Diagnosis not present

## 2021-01-06 DIAGNOSIS — U071 COVID-19: Secondary | ICD-10-CM | POA: Diagnosis not present

## 2021-01-14 DIAGNOSIS — J449 Chronic obstructive pulmonary disease, unspecified: Secondary | ICD-10-CM | POA: Diagnosis not present

## 2021-01-14 DIAGNOSIS — J441 Chronic obstructive pulmonary disease with (acute) exacerbation: Secondary | ICD-10-CM | POA: Diagnosis not present

## 2021-01-16 DIAGNOSIS — J9600 Acute respiratory failure, unspecified whether with hypoxia or hypercapnia: Secondary | ICD-10-CM | POA: Diagnosis not present

## 2021-01-16 DIAGNOSIS — J449 Chronic obstructive pulmonary disease, unspecified: Secondary | ICD-10-CM | POA: Diagnosis not present

## 2021-01-16 DIAGNOSIS — J439 Emphysema, unspecified: Secondary | ICD-10-CM | POA: Diagnosis not present

## 2021-01-17 DIAGNOSIS — Z885 Allergy status to narcotic agent status: Secondary | ICD-10-CM | POA: Diagnosis not present

## 2021-01-17 DIAGNOSIS — I10 Essential (primary) hypertension: Secondary | ICD-10-CM | POA: Diagnosis not present

## 2021-01-17 DIAGNOSIS — F17210 Nicotine dependence, cigarettes, uncomplicated: Secondary | ICD-10-CM | POA: Diagnosis not present

## 2021-01-17 DIAGNOSIS — Z794 Long term (current) use of insulin: Secondary | ICD-10-CM | POA: Diagnosis not present

## 2021-01-17 DIAGNOSIS — I2510 Atherosclerotic heart disease of native coronary artery without angina pectoris: Secondary | ICD-10-CM | POA: Diagnosis not present

## 2021-01-17 DIAGNOSIS — G8929 Other chronic pain: Secondary | ICD-10-CM | POA: Diagnosis not present

## 2021-01-17 DIAGNOSIS — N3090 Cystitis, unspecified without hematuria: Secondary | ICD-10-CM | POA: Diagnosis not present

## 2021-01-17 DIAGNOSIS — R519 Headache, unspecified: Secondary | ICD-10-CM | POA: Diagnosis not present

## 2021-01-17 DIAGNOSIS — K5900 Constipation, unspecified: Secondary | ICD-10-CM | POA: Diagnosis not present

## 2021-01-17 DIAGNOSIS — E119 Type 2 diabetes mellitus without complications: Secondary | ICD-10-CM | POA: Diagnosis not present

## 2021-01-17 DIAGNOSIS — Z882 Allergy status to sulfonamides status: Secondary | ICD-10-CM | POA: Diagnosis not present

## 2021-01-17 DIAGNOSIS — J439 Emphysema, unspecified: Secondary | ICD-10-CM | POA: Diagnosis not present

## 2021-01-17 DIAGNOSIS — Z79899 Other long term (current) drug therapy: Secondary | ICD-10-CM | POA: Diagnosis not present

## 2021-01-17 DIAGNOSIS — E039 Hypothyroidism, unspecified: Secondary | ICD-10-CM | POA: Diagnosis not present

## 2021-01-17 DIAGNOSIS — I252 Old myocardial infarction: Secondary | ICD-10-CM | POA: Diagnosis not present

## 2021-01-17 DIAGNOSIS — Z91041 Radiographic dye allergy status: Secondary | ICD-10-CM | POA: Diagnosis not present

## 2021-01-17 DIAGNOSIS — R0600 Dyspnea, unspecified: Secondary | ICD-10-CM | POA: Diagnosis not present

## 2021-01-17 DIAGNOSIS — J441 Chronic obstructive pulmonary disease with (acute) exacerbation: Secondary | ICD-10-CM | POA: Diagnosis not present

## 2021-01-20 DIAGNOSIS — J449 Chronic obstructive pulmonary disease, unspecified: Secondary | ICD-10-CM | POA: Diagnosis not present

## 2021-01-22 DIAGNOSIS — J439 Emphysema, unspecified: Secondary | ICD-10-CM | POA: Diagnosis not present

## 2021-01-22 DIAGNOSIS — E220 Acromegaly and pituitary gigantism: Secondary | ICD-10-CM | POA: Diagnosis not present

## 2021-01-22 DIAGNOSIS — J9801 Acute bronchospasm: Secondary | ICD-10-CM | POA: Diagnosis not present

## 2021-01-22 DIAGNOSIS — R059 Cough, unspecified: Secondary | ICD-10-CM | POA: Diagnosis not present

## 2021-01-22 DIAGNOSIS — R0600 Dyspnea, unspecified: Secondary | ICD-10-CM | POA: Diagnosis not present

## 2021-02-05 DIAGNOSIS — M2550 Pain in unspecified joint: Secondary | ICD-10-CM | POA: Diagnosis not present

## 2021-02-05 DIAGNOSIS — E559 Vitamin D deficiency, unspecified: Secondary | ICD-10-CM | POA: Diagnosis not present

## 2021-02-05 DIAGNOSIS — J449 Chronic obstructive pulmonary disease, unspecified: Secondary | ICD-10-CM | POA: Diagnosis not present

## 2021-02-05 DIAGNOSIS — E220 Acromegaly and pituitary gigantism: Secondary | ICD-10-CM | POA: Diagnosis not present

## 2021-02-05 DIAGNOSIS — E11618 Type 2 diabetes mellitus with other diabetic arthropathy: Secondary | ICD-10-CM | POA: Diagnosis not present

## 2021-02-05 DIAGNOSIS — E039 Hypothyroidism, unspecified: Secondary | ICD-10-CM | POA: Diagnosis not present

## 2021-02-05 DIAGNOSIS — M1480 Arthropathies in other specified diseases classified elsewhere, unspecified site: Secondary | ICD-10-CM | POA: Diagnosis not present

## 2021-02-05 DIAGNOSIS — M4807 Spinal stenosis, lumbosacral region: Secondary | ICD-10-CM | POA: Diagnosis not present

## 2021-02-05 DIAGNOSIS — E782 Mixed hyperlipidemia: Secondary | ICD-10-CM | POA: Diagnosis not present

## 2021-02-05 DIAGNOSIS — R109 Unspecified abdominal pain: Secondary | ICD-10-CM | POA: Diagnosis not present

## 2021-02-05 DIAGNOSIS — Z79899 Other long term (current) drug therapy: Secondary | ICD-10-CM | POA: Diagnosis not present

## 2021-02-05 DIAGNOSIS — J9612 Chronic respiratory failure with hypercapnia: Secondary | ICD-10-CM | POA: Diagnosis not present

## 2021-02-05 DIAGNOSIS — I700 Atherosclerosis of aorta: Secondary | ICD-10-CM | POA: Diagnosis not present

## 2021-02-10 DIAGNOSIS — J449 Chronic obstructive pulmonary disease, unspecified: Secondary | ICD-10-CM | POA: Diagnosis not present

## 2021-02-12 DIAGNOSIS — J449 Chronic obstructive pulmonary disease, unspecified: Secondary | ICD-10-CM | POA: Diagnosis not present

## 2021-02-12 DIAGNOSIS — R0600 Dyspnea, unspecified: Secondary | ICD-10-CM | POA: Diagnosis not present

## 2021-02-12 DIAGNOSIS — R059 Cough, unspecified: Secondary | ICD-10-CM | POA: Diagnosis not present

## 2021-02-12 DIAGNOSIS — E220 Acromegaly and pituitary gigantism: Secondary | ICD-10-CM | POA: Diagnosis not present

## 2021-02-12 DIAGNOSIS — J310 Chronic rhinitis: Secondary | ICD-10-CM | POA: Diagnosis not present

## 2021-02-12 DIAGNOSIS — J0110 Acute frontal sinusitis, unspecified: Secondary | ICD-10-CM | POA: Diagnosis not present

## 2021-02-14 DIAGNOSIS — J449 Chronic obstructive pulmonary disease, unspecified: Secondary | ICD-10-CM | POA: Diagnosis not present

## 2021-02-14 DIAGNOSIS — J441 Chronic obstructive pulmonary disease with (acute) exacerbation: Secondary | ICD-10-CM | POA: Diagnosis not present

## 2021-02-16 DIAGNOSIS — J449 Chronic obstructive pulmonary disease, unspecified: Secondary | ICD-10-CM | POA: Diagnosis not present

## 2021-02-16 DIAGNOSIS — J9600 Acute respiratory failure, unspecified whether with hypoxia or hypercapnia: Secondary | ICD-10-CM | POA: Diagnosis not present

## 2021-02-16 DIAGNOSIS — J439 Emphysema, unspecified: Secondary | ICD-10-CM | POA: Diagnosis not present

## 2021-03-06 DIAGNOSIS — J449 Chronic obstructive pulmonary disease, unspecified: Secondary | ICD-10-CM | POA: Diagnosis not present

## 2021-03-16 DIAGNOSIS — J449 Chronic obstructive pulmonary disease, unspecified: Secondary | ICD-10-CM | POA: Diagnosis not present

## 2021-03-16 DIAGNOSIS — J441 Chronic obstructive pulmonary disease with (acute) exacerbation: Secondary | ICD-10-CM | POA: Diagnosis not present

## 2021-03-18 DIAGNOSIS — J9600 Acute respiratory failure, unspecified whether with hypoxia or hypercapnia: Secondary | ICD-10-CM | POA: Diagnosis not present

## 2021-03-18 DIAGNOSIS — J439 Emphysema, unspecified: Secondary | ICD-10-CM | POA: Diagnosis not present

## 2021-03-18 DIAGNOSIS — J449 Chronic obstructive pulmonary disease, unspecified: Secondary | ICD-10-CM | POA: Diagnosis not present

## 2021-04-06 DIAGNOSIS — J449 Chronic obstructive pulmonary disease, unspecified: Secondary | ICD-10-CM | POA: Diagnosis not present

## 2021-04-09 DIAGNOSIS — J449 Chronic obstructive pulmonary disease, unspecified: Secondary | ICD-10-CM | POA: Diagnosis not present

## 2021-04-09 DIAGNOSIS — I252 Old myocardial infarction: Secondary | ICD-10-CM | POA: Diagnosis not present

## 2021-04-09 DIAGNOSIS — Z794 Long term (current) use of insulin: Secondary | ICD-10-CM | POA: Diagnosis not present

## 2021-04-09 DIAGNOSIS — Z79899 Other long term (current) drug therapy: Secondary | ICD-10-CM | POA: Diagnosis not present

## 2021-04-09 DIAGNOSIS — I2510 Atherosclerotic heart disease of native coronary artery without angina pectoris: Secondary | ICD-10-CM | POA: Diagnosis not present

## 2021-04-09 DIAGNOSIS — S30861A Insect bite (nonvenomous) of abdominal wall, initial encounter: Secondary | ICD-10-CM | POA: Diagnosis not present

## 2021-04-09 DIAGNOSIS — I10 Essential (primary) hypertension: Secondary | ICD-10-CM | POA: Diagnosis not present

## 2021-04-09 DIAGNOSIS — E119 Type 2 diabetes mellitus without complications: Secondary | ICD-10-CM | POA: Diagnosis not present

## 2021-04-09 DIAGNOSIS — E079 Disorder of thyroid, unspecified: Secondary | ICD-10-CM | POA: Diagnosis not present

## 2021-04-09 DIAGNOSIS — S30861D Insect bite (nonvenomous) of abdominal wall, subsequent encounter: Secondary | ICD-10-CM | POA: Diagnosis not present

## 2021-04-09 DIAGNOSIS — Z9981 Dependence on supplemental oxygen: Secondary | ICD-10-CM | POA: Diagnosis not present

## 2021-04-16 DIAGNOSIS — J441 Chronic obstructive pulmonary disease with (acute) exacerbation: Secondary | ICD-10-CM | POA: Diagnosis not present

## 2021-04-16 DIAGNOSIS — J449 Chronic obstructive pulmonary disease, unspecified: Secondary | ICD-10-CM | POA: Diagnosis not present

## 2021-04-18 DIAGNOSIS — J449 Chronic obstructive pulmonary disease, unspecified: Secondary | ICD-10-CM | POA: Diagnosis not present

## 2021-04-18 DIAGNOSIS — J9600 Acute respiratory failure, unspecified whether with hypoxia or hypercapnia: Secondary | ICD-10-CM | POA: Diagnosis not present

## 2021-04-18 DIAGNOSIS — J439 Emphysema, unspecified: Secondary | ICD-10-CM | POA: Diagnosis not present

## 2021-04-28 DIAGNOSIS — J449 Chronic obstructive pulmonary disease, unspecified: Secondary | ICD-10-CM | POA: Diagnosis not present

## 2021-05-04 DIAGNOSIS — G894 Chronic pain syndrome: Secondary | ICD-10-CM | POA: Diagnosis not present

## 2021-05-04 DIAGNOSIS — M47816 Spondylosis without myelopathy or radiculopathy, lumbar region: Secondary | ICD-10-CM | POA: Diagnosis not present

## 2021-05-04 DIAGNOSIS — M549 Dorsalgia, unspecified: Secondary | ICD-10-CM | POA: Diagnosis not present

## 2021-05-04 DIAGNOSIS — Z1389 Encounter for screening for other disorder: Secondary | ICD-10-CM | POA: Diagnosis not present

## 2021-05-14 DIAGNOSIS — Z1389 Encounter for screening for other disorder: Secondary | ICD-10-CM | POA: Diagnosis not present

## 2021-05-14 DIAGNOSIS — M549 Dorsalgia, unspecified: Secondary | ICD-10-CM | POA: Diagnosis not present

## 2021-05-14 DIAGNOSIS — M47816 Spondylosis without myelopathy or radiculopathy, lumbar region: Secondary | ICD-10-CM | POA: Diagnosis not present

## 2021-05-14 DIAGNOSIS — E220 Acromegaly and pituitary gigantism: Secondary | ICD-10-CM | POA: Diagnosis not present

## 2021-05-16 DIAGNOSIS — J441 Chronic obstructive pulmonary disease with (acute) exacerbation: Secondary | ICD-10-CM | POA: Diagnosis not present

## 2021-05-16 DIAGNOSIS — J449 Chronic obstructive pulmonary disease, unspecified: Secondary | ICD-10-CM | POA: Diagnosis not present

## 2021-05-18 DIAGNOSIS — J439 Emphysema, unspecified: Secondary | ICD-10-CM | POA: Diagnosis not present

## 2021-05-18 DIAGNOSIS — J449 Chronic obstructive pulmonary disease, unspecified: Secondary | ICD-10-CM | POA: Diagnosis not present

## 2021-05-18 DIAGNOSIS — J9600 Acute respiratory failure, unspecified whether with hypoxia or hypercapnia: Secondary | ICD-10-CM | POA: Diagnosis not present

## 2021-05-29 DIAGNOSIS — J449 Chronic obstructive pulmonary disease, unspecified: Secondary | ICD-10-CM | POA: Diagnosis not present

## 2021-06-04 DIAGNOSIS — M461 Sacroiliitis, not elsewhere classified: Secondary | ICD-10-CM | POA: Diagnosis not present

## 2021-06-04 DIAGNOSIS — M549 Dorsalgia, unspecified: Secondary | ICD-10-CM | POA: Diagnosis not present

## 2021-06-04 DIAGNOSIS — Z1389 Encounter for screening for other disorder: Secondary | ICD-10-CM | POA: Diagnosis not present

## 2021-06-11 DIAGNOSIS — J449 Chronic obstructive pulmonary disease, unspecified: Secondary | ICD-10-CM | POA: Diagnosis not present

## 2021-06-11 DIAGNOSIS — E220 Acromegaly and pituitary gigantism: Secondary | ICD-10-CM | POA: Diagnosis not present

## 2021-06-11 DIAGNOSIS — Z9981 Dependence on supplemental oxygen: Secondary | ICD-10-CM | POA: Diagnosis not present

## 2021-06-11 DIAGNOSIS — E559 Vitamin D deficiency, unspecified: Secondary | ICD-10-CM | POA: Diagnosis not present

## 2021-06-11 DIAGNOSIS — G720 Drug-induced myopathy: Secondary | ICD-10-CM | POA: Diagnosis not present

## 2021-06-11 DIAGNOSIS — M2550 Pain in unspecified joint: Secondary | ICD-10-CM | POA: Diagnosis not present

## 2021-06-11 DIAGNOSIS — E782 Mixed hyperlipidemia: Secondary | ICD-10-CM | POA: Diagnosis not present

## 2021-06-11 DIAGNOSIS — I700 Atherosclerosis of aorta: Secondary | ICD-10-CM | POA: Diagnosis not present

## 2021-06-11 DIAGNOSIS — E11618 Type 2 diabetes mellitus with other diabetic arthropathy: Secondary | ICD-10-CM | POA: Diagnosis not present

## 2021-06-11 DIAGNOSIS — M4807 Spinal stenosis, lumbosacral region: Secondary | ICD-10-CM | POA: Diagnosis not present

## 2021-06-11 DIAGNOSIS — E039 Hypothyroidism, unspecified: Secondary | ICD-10-CM | POA: Diagnosis not present

## 2021-06-11 DIAGNOSIS — M1480 Arthropathies in other specified diseases classified elsewhere, unspecified site: Secondary | ICD-10-CM | POA: Diagnosis not present

## 2021-06-11 DIAGNOSIS — J9612 Chronic respiratory failure with hypercapnia: Secondary | ICD-10-CM | POA: Diagnosis not present

## 2021-06-16 DIAGNOSIS — J449 Chronic obstructive pulmonary disease, unspecified: Secondary | ICD-10-CM | POA: Diagnosis not present

## 2021-06-18 DIAGNOSIS — J9600 Acute respiratory failure, unspecified whether with hypoxia or hypercapnia: Secondary | ICD-10-CM | POA: Diagnosis not present

## 2021-06-18 DIAGNOSIS — J439 Emphysema, unspecified: Secondary | ICD-10-CM | POA: Diagnosis not present

## 2021-06-18 DIAGNOSIS — J449 Chronic obstructive pulmonary disease, unspecified: Secondary | ICD-10-CM | POA: Diagnosis not present

## 2021-06-21 ENCOUNTER — Inpatient Hospital Stay
Admission: EM | Admit: 2021-06-21 | Discharge: 2021-06-25 | DRG: 190 | Disposition: A | Discharge status: Home / Self Care | Payer: Medicare Other | Attending: Internal Medicine | Admitting: Internal Medicine

## 2021-06-21 ENCOUNTER — Emergency Department: Payer: Medicare Other

## 2021-06-21 ENCOUNTER — Other Ambulatory Visit: Payer: Self-pay

## 2021-06-21 DIAGNOSIS — J9811 Atelectasis: Secondary | ICD-10-CM | POA: Diagnosis not present

## 2021-06-21 DIAGNOSIS — R0603 Acute respiratory distress: Secondary | ICD-10-CM | POA: Diagnosis not present

## 2021-06-21 DIAGNOSIS — E1142 Type 2 diabetes mellitus with diabetic polyneuropathy: Secondary | ICD-10-CM | POA: Diagnosis not present

## 2021-06-21 DIAGNOSIS — J441 Chronic obstructive pulmonary disease with (acute) exacerbation: Secondary | ICD-10-CM | POA: Diagnosis present

## 2021-06-21 DIAGNOSIS — Z794 Long term (current) use of insulin: Secondary | ICD-10-CM

## 2021-06-21 DIAGNOSIS — Z882 Allergy status to sulfonamides status: Secondary | ICD-10-CM

## 2021-06-21 DIAGNOSIS — Z91041 Radiographic dye allergy status: Secondary | ICD-10-CM | POA: Diagnosis not present

## 2021-06-21 DIAGNOSIS — G4733 Obstructive sleep apnea (adult) (pediatric): Secondary | ICD-10-CM | POA: Diagnosis present

## 2021-06-21 DIAGNOSIS — J439 Emphysema, unspecified: Secondary | ICD-10-CM | POA: Diagnosis not present

## 2021-06-21 DIAGNOSIS — E220 Acromegaly and pituitary gigantism: Secondary | ICD-10-CM | POA: Diagnosis not present

## 2021-06-21 DIAGNOSIS — E039 Hypothyroidism, unspecified: Secondary | ICD-10-CM | POA: Diagnosis not present

## 2021-06-21 DIAGNOSIS — E119 Type 2 diabetes mellitus without complications: Secondary | ICD-10-CM | POA: Diagnosis not present

## 2021-06-21 DIAGNOSIS — J9622 Acute and chronic respiratory failure with hypercapnia: Secondary | ICD-10-CM | POA: Diagnosis present

## 2021-06-21 DIAGNOSIS — I700 Atherosclerosis of aorta: Secondary | ICD-10-CM | POA: Diagnosis not present

## 2021-06-21 DIAGNOSIS — F17210 Nicotine dependence, cigarettes, uncomplicated: Secondary | ICD-10-CM | POA: Diagnosis present

## 2021-06-21 DIAGNOSIS — Z881 Allergy status to other antibiotic agents status: Secondary | ICD-10-CM | POA: Diagnosis not present

## 2021-06-21 DIAGNOSIS — F32A Depression, unspecified: Secondary | ICD-10-CM | POA: Diagnosis present

## 2021-06-21 DIAGNOSIS — E079 Disorder of thyroid, unspecified: Secondary | ICD-10-CM

## 2021-06-21 DIAGNOSIS — Z79890 Hormone replacement therapy (postmenopausal): Secondary | ICD-10-CM

## 2021-06-21 DIAGNOSIS — E785 Hyperlipidemia, unspecified: Secondary | ICD-10-CM | POA: Diagnosis not present

## 2021-06-21 DIAGNOSIS — J9602 Acute respiratory failure with hypercapnia: Secondary | ICD-10-CM | POA: Diagnosis present

## 2021-06-21 DIAGNOSIS — Z885 Allergy status to narcotic agent status: Secondary | ICD-10-CM

## 2021-06-21 DIAGNOSIS — K7581 Nonalcoholic steatohepatitis (NASH): Secondary | ICD-10-CM

## 2021-06-21 DIAGNOSIS — Z79899 Other long term (current) drug therapy: Secondary | ICD-10-CM

## 2021-06-21 DIAGNOSIS — M48061 Spinal stenosis, lumbar region without neurogenic claudication: Secondary | ICD-10-CM | POA: Diagnosis present

## 2021-06-21 DIAGNOSIS — J9601 Acute respiratory failure with hypoxia: Secondary | ICD-10-CM | POA: Diagnosis not present

## 2021-06-21 DIAGNOSIS — Z20822 Contact with and (suspected) exposure to covid-19: Secondary | ICD-10-CM | POA: Diagnosis present

## 2021-06-21 DIAGNOSIS — R7989 Other specified abnormal findings of blood chemistry: Secondary | ICD-10-CM | POA: Diagnosis not present

## 2021-06-21 LAB — COMPREHENSIVE METABOLIC PANEL
ALT: 49 U/L — ABNORMAL HIGH (ref 0–44)
AST: 46 U/L — ABNORMAL HIGH (ref 15–41)
Albumin: 4.4 g/dL (ref 3.5–5.0)
Alkaline Phosphatase: 114 U/L (ref 38–126)
Anion gap: 10 (ref 5–15)
BUN: 13 mg/dL (ref 8–23)
CO2: 28 mmol/L (ref 22–32)
Calcium: 9.3 mg/dL (ref 8.9–10.3)
Chloride: 100 mmol/L (ref 98–111)
Creatinine, Ser: 1.16 mg/dL — ABNORMAL HIGH (ref 0.44–1.00)
GFR, Estimated: 53 mL/min — ABNORMAL LOW (ref >60)
Glucose, Bld: 197 mg/dL — ABNORMAL HIGH (ref 70–99)
Potassium: 4.6 mmol/L (ref 3.5–5.1)
Sodium: 138 mmol/L (ref 135–145)
Total Bilirubin: 0.7 mg/dL (ref 0.3–1.2)
Total Protein: 7.5 g/dL (ref 6.5–8.1)

## 2021-06-21 LAB — BLOOD GAS, VENOUS
Acid-Base Excess: 0.9 mmol/L (ref 0.0–2.0)
Bicarbonate: 30.3 mmol/L — ABNORMAL HIGH (ref 20.0–28.0)
O2 Saturation: 56.2 %
Patient temperature: 37
pCO2, Ven: 69 mmHg — ABNORMAL HIGH (ref 44.0–60.0)
pH, Ven: 7.25 (ref 7.250–7.430)
pO2, Ven: 35 mmHg (ref 32.0–45.0)

## 2021-06-21 LAB — CBC
HCT: 43 % (ref 36.0–46.0)
Hemoglobin: 14.5 g/dL (ref 12.0–15.0)
MCH: 30.7 pg (ref 26.0–34.0)
MCHC: 33.7 g/dL (ref 30.0–36.0)
MCV: 91.1 fL (ref 80.0–100.0)
Platelets: 344 10*3/uL (ref 150–400)
RBC: 4.72 MIL/uL (ref 3.87–5.11)
RDW: 13.8 % (ref 11.5–15.5)
WBC: 12.1 10*3/uL — ABNORMAL HIGH (ref 4.0–10.5)
nRBC: 0 % (ref 0.0–0.2)

## 2021-06-21 LAB — TROPONIN I (HIGH SENSITIVITY): Troponin I (High Sensitivity): 28 ng/L — ABNORMAL HIGH (ref <18)

## 2021-06-21 LAB — D-DIMER, QUANTITATIVE: D-Dimer, Quant: 1.1 ug/mL-FEU — ABNORMAL HIGH (ref 0.00–0.50)

## 2021-06-21 MED ORDER — DIPHENHYDRAMINE HCL 25 MG PO CAPS: 50.0000 mg | ORAL_CAPSULE | Freq: Once | ORAL | Status: AC

## 2021-06-21 MED ORDER — IPRATROPIUM-ALBUTEROL 0.5-2.5 (3) MG/3ML IN SOLN
3.0000 mL | Freq: Once | RESPIRATORY_TRACT | Status: AC
  Administered 2021-06-21: 3 mL via RESPIRATORY_TRACT
  Filled 2021-06-21: qty 3

## 2021-06-21 MED ORDER — HEPARIN (PORCINE) 25000 UT/250ML-% IV SOLN
950.0000 [IU]/h | INTRAVENOUS | Status: DC
  Administered 2021-06-21: 950 [IU]/h via INTRAVENOUS
  Filled 2021-06-21: qty 250

## 2021-06-21 MED ORDER — HYDROCORTISONE NA SUCCINATE PF 250 MG IJ SOLR
200.0000 mg | Freq: Once | INTRAMUSCULAR | Status: AC
  Administered 2021-06-21: 200 mg via INTRAVENOUS
  Filled 2021-06-21: qty 200

## 2021-06-21 MED ORDER — SODIUM CHLORIDE 0.9 % IV BOLUS
1000.0000 mL | Freq: Once | INTRAVENOUS | Status: AC
  Administered 2021-06-21: 1000 mL via INTRAVENOUS

## 2021-06-21 MED ORDER — DIPHENHYDRAMINE HCL 50 MG/ML IJ SOLN
50.0000 mg | Freq: Once | INTRAMUSCULAR | Status: AC
  Administered 2021-06-22: 50 mg via INTRAVENOUS
  Filled 2021-06-21: qty 1

## 2021-06-21 MED ORDER — MAGNESIUM SULFATE 2 GM/50ML IV SOLN
2.0000 g | Freq: Once | INTRAVENOUS | Status: AC
  Administered 2021-06-21: 2 g via INTRAVENOUS
  Filled 2021-06-21: qty 50

## 2021-06-21 MED ORDER — HEPARIN BOLUS VIA INFUSION
4000.0000 [IU] | Freq: Once | INTRAVENOUS | Status: AC
  Administered 2021-06-21: 4000 [IU] via INTRAVENOUS
  Filled 2021-06-21: qty 4000

## 2021-06-21 NOTE — Progress Notes (Signed)
Patient transported on bipap to and from CT without incident.

## 2021-06-21 NOTE — ED Triage Notes (Signed)
SOB cbg 236; pt came from relatives house; opt went unresponsive in the yard; hx copd; gave '125mg'$  solumedrol; NSR; 60 on RA;

## 2021-06-21 NOTE — Progress Notes (Signed)
ANTICOAGULATION CONSULT NOTE - Initial Consult  Pharmacy Consult for Heparin  Indication: chest pain/ACS  Allergies  Allergen Reactions   Keflex [Cephalexin] Hives   Ivp Dye [Iodinated Diagnostic Agents] Other (See Comments)    Reaction:  Redness    Sulfa Antibiotics Anxiety   Tylenol With Codeine #3 [Acetaminophen-Codeine] Palpitations    Patient Measurements: Height: '5\' 7"'$  (170.2 cm) Weight: 81.6 kg (180 lb) IBW/kg (Calculated) : 61.6 Heparin Dosing Weight: 78.4 kg   Vital Signs: Temp: 98.1 F (36.7 C) (08/07 2150) Temp Source: Axillary (08/07 2150) BP: 86/58 (08/07 2200) Pulse Rate: 88 (08/07 2200)  Labs: Recent Labs    06/21/21 2150  HGB 14.5  HCT 43.0  PLT 344  CREATININE 1.16*  TROPONINIHS 28*    Estimated Creatinine Clearance: 54.5 mL/min (A) (by C-G formula based on SCr of 1.16 mg/dL (H)).   Medical History: Past Medical History:  Diagnosis Date   COPD (chronic obstructive pulmonary disease) ([facility])    Lumbar stenosis    Spinal stenosis of sacral region    Thyroid disease    hypo   Tobacco abuse     Medications:  (Not in a hospital admission)   Assessment: Pharmacy consulted to dose heparin in this 65 year old female admitted with ACS/NSTEMI.  CrCl = 54.5 ml/min  No prior anticoag noted   Goal of Therapy:  Heparin level 0.3-0.7 units/ml Monitor platelets by anticoagulation protocol: Yes   Plan:  Give 4000 units bolus x 1 Start heparin infusion at 950 units/hr Check anti-Xa level in 6 hours and daily while on heparin Continue to monitor H&H and platelets  Kennith Morss D 06/21/2021,10:55 PM

## 2021-06-21 NOTE — ED Provider Notes (Signed)
[facility] Emergency Department Provider Note    Event Date/Time   First MD Initiated Contact with Patient 06/21/21 2149     (approximate)  I have reviewed the triage vital signs and the nursing notes.   HISTORY  Chief Complaint Respiratory Distress  Level V Caveat:  acute resp distress  HPI Alison Taylor is a 65 y.o. female presents to the ER in acute respiratory distress.  Was reportedly at family friend's house was complaining of shortness of breath and went unresponsive.  EMS found the patient to be hypoxic placed on nonrebreather was having spontaneous respirations.  No CPR performed.  Was given nebs as well as Solu-Medrol by EMS.  Was complaining of some nausea but no longer complaining of nausea.  No recent fevers or chills.  Does have some mild chest discomfort.  Past Medical History:  Diagnosis Date   COPD (chronic obstructive pulmonary disease) ([place])    Lumbar stenosis    Spinal stenosis of sacral region    Thyroid disease    hypo   Tobacco abuse    Family History  Problem Relation Age of Onset   ALS Brother    Breast cancer Neg Hx    History reviewed. No pertinent surgical history. Patient Active Problem List   Diagnosis Date Noted   COPD exacerbation ([place]) 12/10/2015      Prior to Admission medications   Medication Sig Start Date End Date Taking? Authorizing Provider  albuterol (PROVENTIL HFA;VENTOLIN HFA) 108 (90 Base) MCG/ACT inhaler Inhale 2 puffs into the lungs every 6 (six) hours as needed for wheezing or shortness of breath. 12/15/15   Demetrios Loll, MD  albuterol (PROVENTIL HFA;VENTOLIN HFA) 108 [phone] Base) MCG/ACT inhaler Inhale 2 puffs into the lungs every 6 (six) hours as needed for wheezing or shortness of breath. 04/12/16   Joanne Gavel, MD  b complex vitamins tablet Take 1 tablet by mouth daily.    [provider]  benzonatate (TESSALON PERLES) 100 MG capsule Take 1 capsule (100 mg total) by mouth 3 (three) times  daily as needed for cough. 12/15/15   Demetrios Loll, MD  cholecalciferol (VITAMIN D) 1000 units tablet Take 2,000 Units by mouth daily.    [provider]  cyclobenzaprine (FLEXERIL) 10 MG tablet Take 10 mg by mouth 3 (three) times daily as needed for muscle spasms.    [provider]  Fluticasone-Salmeterol (ADVAIR) 250-50 MCG/DOSE AEPB Inhale 1 puff into the lungs 2 (two) times daily. 12/15/15   Demetrios Loll, MD  gabapentin (NEURONTIN) 800 MG tablet Take 800 mg by mouth 3 (three) times daily.    [provider]  HYDROcodone-acetaminophen (NORCO/VICODIN) 5-325 MG tablet Take 1 tablet by mouth every 6 (six) hours as needed for moderate pain.    [provider]  ipratropium-albuterol (DUONEB) 0.5-2.5 (3) MG/3ML SOLN Take 3 mLs by nebulization every 4 (four) hours as needed (for wheezing/shortness of breath).    [provider]  levothyroxine (SYNTHROID, LEVOTHROID) 150 MCG tablet Take 150 mcg by mouth daily before breakfast.    [provider]  metoprolol tartrate (LOPRESSOR) 25 MG tablet Take 25 mg by mouth 2 (two) times daily.    [provider]  Multiple Vitamins-Minerals (OCUVITE EYE HEATLH GUMMIES PO) Take 2 each by mouth daily.    [provider]  omeprazole (PRILOSEC) 20 MG capsule Take 20 mg by mouth daily as needed (for acid reflux).    [provider]    Allergies  Keflex [cephalexin], Ivp dye [iodinated diagnostic agents], Sulfa antibiotics, and Tylenol with codeine #3 [acetaminophen-codeine]    Social History Social History   Tobacco Use   Smoking status: Every Day    Packs/day: 0.50    Types: Cigarettes  Substance Use Topics   Alcohol use: No    Alcohol/week: 0.0 standard drinks   Drug use: No    Review of Systems Patient denies headaches, rhinorrhea, blurry vision, numbness, shortness of breath, chest pain, edema, cough, abdominal pain, nausea, vomiting, diarrhea, dysuria, fevers, rashes or  hallucinations unless otherwise stated above in HPI. ____________________________________________   PHYSICAL EXAM:  VITAL SIGNS: Vitals:   06/21/21 2150 06/21/21 2200  BP:  (!) 86/58  Pulse:  88  Resp: (!) 26 18  Temp: 98.1 F (36.7 C)   SpO2: 100% 99%    Constitutional: Alert and oriented.  Eyes: Conjunctivae are normal.  Head: Atraumatic. Nose: No congestion/rhinnorhea. Mouth/Throat: Mucous membranes are moist.   Neck: No stridor. Painless ROM.  Cardiovascular: tachycardic, regular rhythm. Grossly normal heart sounds.  Good peripheral circulation. Respiratory: tachypnea with diminished bs throughout Gastrointestinal: Soft and nontender. No distention. No abdominal bruits. No CVA tenderness. Genitourinary:  Musculoskeletal: No lower extremity tenderness nor edema.  No joint effusions. Neurologic:   No gross focal neurologic deficits are appreciated. No facial droop Skin:  Skin is cool, dry and intact. No rash noted. Psychiatric: anxious, cooperative  ____________________________________________   LABS (all labs ordered are listed, but only abnormal results are displayed)  Results for orders placed or performed during the hospital encounter of 06/21/21 (from the past 24 hour(s))  CBC     Status: Abnormal   Collection Time: 06/21/21  9:50 PM  Result Value Ref Range   WBC 12.1 (H) 4.0 - 10.5 K/uL   RBC 4.72 3.87 - 5.11 MIL/uL   Hemoglobin 14.5 12.0 - 15.0 g/dL   HCT 43.0 36.0 - 46.0 %   MCV 91.1 80.0 - 100.0 fL   MCH 30.7 26.0 - 34.0 pg   MCHC 33.7 30.0 - 36.0 g/dL   RDW 13.8 11.5 - 15.5 %   Platelets 344 150 - 400 K/uL   nRBC 0.0 0.0 - 0.2 %  Comprehensive metabolic panel     Status: Abnormal   Collection Time: 06/21/21  9:50 PM  Result Value Ref Range   Sodium 138 135 - 145 mmol/L   Potassium 4.6 3.5 - 5.1 mmol/L   Chloride 100 98 - 111 mmol/L   CO2 28 22 - 32 mmol/L   Glucose, Bld 197 (H) 70 - 99 mg/dL   BUN 13 8 - 23 mg/dL   Creatinine, Ser 1.16 (H) 0.44  - 1.00 mg/dL   Calcium 9.3 8.9 - 10.3 mg/dL   Total Protein 7.5 6.5 - 8.1 g/dL   Albumin 4.4 3.5 - 5.0 g/dL   AST 46 (H) 15 - 41 U/L   ALT 49 (H) 0 - 44 U/L   Alkaline Phosphatase 114 38 - 126 U/L   Total Bilirubin 0.7 0.3 - 1.2 mg/dL   GFR, Estimated 53 (L) >60 mL/min   Anion gap 10 5 - 15  Troponin I (High Sensitivity)     Status: Abnormal   Collection Time: 06/21/21  9:50 PM  Result Value Ref Range   Troponin I (High Sensitivity) 28 (H) <18 ng/L  D-dimer, quantitative     Status: Abnormal   Collection Time: 06/21/21  9:50 PM  Result Value Ref Range   D-Dimer, Quant 1.10 (H)  0.00 - 0.50 ug/mL-FEU  Blood gas, venous     Status: Abnormal   Collection Time: 06/21/21  9:57 PM  Result Value Ref Range   pH, Ven 7.25 7.250 - 7.430   pCO2, Ven 69 (H) 44.0 - 60.0 mmHg   pO2, Ven 35.0 32.0 - 45.0 mmHg   Bicarbonate 30.3 (H) 20.0 - 28.0 mmol/L   Acid-Base Excess 0.9 0.0 - 2.0 mmol/L   O2 Saturation 56.2 %   Patient temperature 37.0    Collection site VEIN    Sample type VENOUS    ____________________________________________  EKG My review and personal interpretation at Time: 21:53   Indication: sob  Rate: 90  Rhythm: sinus Axis: left Other: poor r wave progression, no stemi ____________________________________________  RADIOLOGY  I personally reviewed all radiographic images ordered to evaluate for the above acute complaints and reviewed radiology reports and findings.  These findings were personally discussed with the patient.  Please see medical record for radiology report.  ____________________________________________   PROCEDURES  Procedure(s) performed:  .Critical Care  Date/Time: 06/21/2021 10:47 PM Performed by: Merlyn Lot, MD Authorized by: Merlyn Lot, MD   Critical care provider statement:    Critical care time (minutes):  35   Critical care time was exclusive of:  Separately billable procedures and treating other patients   Critical care was  necessary to treat or prevent imminent or life-threatening deterioration of the following conditions:  Respiratory failure   Critical care was time spent personally by me on the following activities:  Development of treatment plan with patient or surrogate, discussions with consultants, evaluation of patient's response to treatment, examination of patient, obtaining history from patient or surrogate, ordering and performing treatments and interventions, ordering and review of laboratory studies, ordering and review of radiographic studies, pulse oximetry, re-evaluation of patient's condition and review of old charts    Critical Care performed: yes ____________________________________________   INITIAL IMPRESSION / [place] / ED COURSE  Pertinent labs & imaging results that were available during my care of the patient were reviewed by me and considered in my medical decision making (see chart for details).   DDX: Asthma, copd, CHF, pna, ptx, malignancy, Pe, anemia   Alison Taylor is a 65 y.o. who presents to the ED with presentation with acute respiratory failure as described above.  Placed on BiPAP.  She is mentating.  Blood will be sent for the above differential  will give nebs and mag.  Clinical Course as of 06/21/21 [id number]  Sun Jun 21, 2021  2220 Protecting airway.  Seems to be significantly improved on BiPAP.   VBG does have hypercapnia compensated. [PR]  2232 Patient continues to feel improved.  D-dimer is mildly elevated.  IV fluids infusing.  Blood pressure is soft.  Patient has contrast allergy therefore will require prep before CTA. [PR]  2241 Patient's troponin is elevated.  This may be secondary to demand ischemia in setting hypoxia but given concern for possible PE with delay in CTA due to her contrast allergy during prep we will go ahead and initiate heparin.  No contraindications for heparin [PR]    Clinical Course User Index [PR] Merlyn Lot, MD    The  patient was evaluated in Emergency Department today for the symptoms described in the history of present illness. He/she was evaluated in the context of the global COVID-19 pandemic, which necessitated consideration that the patient might be at risk for infection with the SARS-CoV-2 virus that causes COVID-19. Institutional  protocols and algorithms that pertain to the evaluation of patients at risk for COVID-19 are in a state of rapid change based on information released by regulatory bodies including the CDC and federal and state organizations. These policies and algorithms were followed during the patient's care in the ED.  As part of my medical decision making, I reviewed the following data within the [place] notes reviewed and incorporated, Labs reviewed, notes from prior ED visits and [place] Controlled Substance Database   ____________________________________________   FINAL CLINICAL IMPRESSION(S) / ED DIAGNOSES  Final diagnoses:  Acute respiratory failure with hypoxia ([place])      NEW MEDICATIONS STARTED DURING THIS VISIT:  New Prescriptions   No medications on file     Note:  This document was prepared using Dragon voice recognition software and may include unintentional dictation errors.    Merlyn Lot, MD 06/21/21 [phone]

## 2021-06-22 ENCOUNTER — Emergency Department: Payer: Medicare Other

## 2021-06-22 ENCOUNTER — Encounter: Payer: Self-pay | Admitting: Internal Medicine

## 2021-06-22 DIAGNOSIS — Z794 Long term (current) use of insulin: Secondary | ICD-10-CM | POA: Diagnosis not present

## 2021-06-22 DIAGNOSIS — Z882 Allergy status to sulfonamides status: Secondary | ICD-10-CM | POA: Diagnosis not present

## 2021-06-22 DIAGNOSIS — E785 Hyperlipidemia, unspecified: Secondary | ICD-10-CM | POA: Diagnosis present

## 2021-06-22 DIAGNOSIS — E119 Type 2 diabetes mellitus without complications: Secondary | ICD-10-CM | POA: Diagnosis not present

## 2021-06-22 DIAGNOSIS — J9602 Acute respiratory failure with hypercapnia: Secondary | ICD-10-CM | POA: Diagnosis not present

## 2021-06-22 DIAGNOSIS — Z79899 Other long term (current) drug therapy: Secondary | ICD-10-CM | POA: Diagnosis not present

## 2021-06-22 DIAGNOSIS — F17210 Nicotine dependence, cigarettes, uncomplicated: Secondary | ICD-10-CM | POA: Diagnosis present

## 2021-06-22 DIAGNOSIS — E039 Hypothyroidism, unspecified: Secondary | ICD-10-CM | POA: Diagnosis not present

## 2021-06-22 DIAGNOSIS — Z91041 Radiographic dye allergy status: Secondary | ICD-10-CM | POA: Diagnosis not present

## 2021-06-22 DIAGNOSIS — Z79890 Hormone replacement therapy (postmenopausal): Secondary | ICD-10-CM | POA: Diagnosis not present

## 2021-06-22 DIAGNOSIS — Z881 Allergy status to other antibiotic agents status: Secondary | ICD-10-CM | POA: Diagnosis not present

## 2021-06-22 DIAGNOSIS — K760 Fatty (change of) liver, not elsewhere classified: Secondary | ICD-10-CM | POA: Diagnosis not present

## 2021-06-22 DIAGNOSIS — R7989 Other specified abnormal findings of blood chemistry: Secondary | ICD-10-CM | POA: Diagnosis not present

## 2021-06-22 DIAGNOSIS — J441 Chronic obstructive pulmonary disease with (acute) exacerbation: Secondary | ICD-10-CM

## 2021-06-22 DIAGNOSIS — Z885 Allergy status to narcotic agent status: Secondary | ICD-10-CM | POA: Diagnosis not present

## 2021-06-22 DIAGNOSIS — J9811 Atelectasis: Secondary | ICD-10-CM | POA: Diagnosis not present

## 2021-06-22 DIAGNOSIS — J9621 Acute and chronic respiratory failure with hypoxia: Secondary | ICD-10-CM | POA: Diagnosis not present

## 2021-06-22 DIAGNOSIS — J439 Emphysema, unspecified: Secondary | ICD-10-CM | POA: Diagnosis present

## 2021-06-22 DIAGNOSIS — G4733 Obstructive sleep apnea (adult) (pediatric): Secondary | ICD-10-CM | POA: Diagnosis present

## 2021-06-22 DIAGNOSIS — E1142 Type 2 diabetes mellitus with diabetic polyneuropathy: Secondary | ICD-10-CM | POA: Diagnosis present

## 2021-06-22 DIAGNOSIS — E220 Acromegaly and pituitary gigantism: Secondary | ICD-10-CM | POA: Diagnosis present

## 2021-06-22 DIAGNOSIS — J9601 Acute respiratory failure with hypoxia: Secondary | ICD-10-CM | POA: Diagnosis present

## 2021-06-22 DIAGNOSIS — F32A Depression, unspecified: Secondary | ICD-10-CM | POA: Diagnosis present

## 2021-06-22 DIAGNOSIS — J9622 Acute and chronic respiratory failure with hypercapnia: Secondary | ICD-10-CM | POA: Diagnosis present

## 2021-06-22 DIAGNOSIS — M48061 Spinal stenosis, lumbar region without neurogenic claudication: Secondary | ICD-10-CM | POA: Diagnosis present

## 2021-06-22 DIAGNOSIS — Z20822 Contact with and (suspected) exposure to covid-19: Secondary | ICD-10-CM | POA: Diagnosis present

## 2021-06-22 LAB — CBC
HCT: 37.6 % (ref 36.0–46.0)
Hemoglobin: 12.6 g/dL (ref 12.0–15.0)
MCH: 30.1 pg (ref 26.0–34.0)
MCHC: 33.5 g/dL (ref 30.0–36.0)
MCV: 90 fL (ref 80.0–100.0)
Platelets: 276 10*3/uL (ref 150–400)
RBC: 4.18 MIL/uL (ref 3.87–5.11)
RDW: 14 % (ref 11.5–15.5)
WBC: 11.6 10*3/uL — ABNORMAL HIGH (ref 4.0–10.5)
nRBC: 0 % (ref 0.0–0.2)

## 2021-06-22 LAB — PROTIME-INR
INR: 1 (ref 0.8–1.2)
Prothrombin Time: 13.4 seconds (ref 11.4–15.2)

## 2021-06-22 LAB — TROPONIN I (HIGH SENSITIVITY): Troponin I (High Sensitivity): 25 ng/L — ABNORMAL HIGH (ref <18)

## 2021-06-22 LAB — APTT: aPTT: 27 seconds (ref 24–36)

## 2021-06-22 LAB — RESP PANEL BY RT-PCR (FLU A&B, COVID) ARPGX2
Influenza A by PCR: NEGATIVE
Influenza B by PCR: NEGATIVE
SARS Coronavirus 2 by RT PCR: NEGATIVE

## 2021-06-22 LAB — CREATININE, SERUM
Creatinine, Ser: 1.05 mg/dL — ABNORMAL HIGH (ref 0.44–1.00)
GFR, Estimated: 60 mL/min — ABNORMAL LOW (ref >60)

## 2021-06-22 LAB — HIV ANTIBODY (ROUTINE TESTING W REFLEX): HIV Screen 4th Generation wRfx: NONREACTIVE

## 2021-06-22 LAB — GLUCOSE, CAPILLARY: Glucose-Capillary: 249 mg/dL — ABNORMAL HIGH (ref 70–99)

## 2021-06-22 MED ORDER — IPRATROPIUM-ALBUTEROL 0.5-2.5 (3) MG/3ML IN SOLN
3.0000 mL | Freq: Four times a day (QID) | RESPIRATORY_TRACT | Status: DC
  Administered 2021-06-22 – 2021-06-25 (×14): 3 mL via RESPIRATORY_TRACT
  Filled 2021-06-22 (×15): qty 3

## 2021-06-22 MED ORDER — PREDNISONE 20 MG PO TABS
40.0000 mg | ORAL_TABLET | Freq: Every day | ORAL | Status: DC
  Filled 2021-06-22: qty 2

## 2021-06-22 MED ORDER — GABAPENTIN 300 MG PO CAPS
300.0000 mg | ORAL_CAPSULE | Freq: Once | ORAL | Status: AC
  Administered 2021-06-22: 300 mg via ORAL
  Filled 2021-06-22: qty 1

## 2021-06-22 MED ORDER — GABAPENTIN 400 MG PO CAPS
800.0000 mg | ORAL_CAPSULE | Freq: Three times a day (TID) | ORAL | Status: DC
  Administered 2021-06-22 – 2021-06-25 (×7): 800 mg via ORAL
  Filled 2021-06-22 (×9): qty 2

## 2021-06-22 MED ORDER — HYDROCODONE-ACETAMINOPHEN 5-325 MG PO TABS
1.0000 | ORAL_TABLET | ORAL | Status: DC | PRN
Start: 2021-06-22 — End: 2021-06-25
  Administered 2021-06-22 (×2): 1 via ORAL
  Filled 2021-06-22 (×2): qty 1

## 2021-06-22 MED ORDER — INSULIN ASPART 100 UNIT/ML IJ SOLN
0.0000 [IU] | Freq: Every day | INTRAMUSCULAR | Status: DC
  Administered 2021-06-22 – 2021-06-23 (×2): 2 [IU] via SUBCUTANEOUS
  Filled 2021-06-22 (×2): qty 1

## 2021-06-22 MED ORDER — VENLAFAXINE HCL ER 75 MG PO CP24
75.0000 mg | ORAL_CAPSULE | Freq: Every day | ORAL | Status: DC
  Administered 2021-06-23 – 2021-06-25 (×3): 75 mg via ORAL
  Filled 2021-06-22 (×3): qty 1

## 2021-06-22 MED ORDER — LEVOTHYROXINE SODIUM 137 MCG PO TABS
137.0000 ug | ORAL_TABLET | Freq: Every day | ORAL | Status: DC
  Administered 2021-06-23 – 2021-06-25 (×3): 137 ug via ORAL
  Filled 2021-06-22 (×5): qty 1

## 2021-06-22 MED ORDER — ONDANSETRON HCL 4 MG/2ML IJ SOLN: 4.0000 mg | Freq: Four times a day (QID) | INTRAMUSCULAR | Status: DC | PRN

## 2021-06-22 MED ORDER — IPRATROPIUM-ALBUTEROL 0.5-2.5 (3) MG/3ML IN SOLN: 3.0000 mL | Freq: Once | RESPIRATORY_TRACT | Status: AC

## 2021-06-22 MED ORDER — ENOXAPARIN SODIUM 40 MG/0.4ML IJ SOSY
40.0000 mg | PREFILLED_SYRINGE | INTRAMUSCULAR | Status: DC
  Administered 2021-06-22 – 2021-06-24 (×3): 40 mg via SUBCUTANEOUS
  Filled 2021-06-22 (×3): qty 0.4

## 2021-06-22 MED ORDER — ACETAMINOPHEN 325 MG PO TABS
650.0000 mg | ORAL_TABLET | Freq: Four times a day (QID) | ORAL | Status: DC | PRN
  Administered 2021-06-22: 650 mg via ORAL
  Filled 2021-06-22: qty 2

## 2021-06-22 MED ORDER — HYDROCODONE-ACETAMINOPHEN 7.5-325 MG PO TABS
1.0000 | ORAL_TABLET | Freq: Four times a day (QID) | ORAL | Status: DC
  Administered 2021-06-22 – 2021-06-25 (×10): 1 via ORAL
  Filled 2021-06-22 (×10): qty 1

## 2021-06-22 MED ORDER — IPRATROPIUM-ALBUTEROL 0.5-2.5 (3) MG/3ML IN SOLN
RESPIRATORY_TRACT | Status: AC
  Administered 2021-06-22: 3 mL via RESPIRATORY_TRACT
  Filled 2021-06-22: qty 3

## 2021-06-22 MED ORDER — METHYLPREDNISOLONE SODIUM SUCC 40 MG IJ SOLR
40.0000 mg | Freq: Two times a day (BID) | INTRAMUSCULAR | Status: AC
  Administered 2021-06-22 (×2): 40 mg via INTRAVENOUS
  Filled 2021-06-22 (×2): qty 1

## 2021-06-22 MED ORDER — CYCLOBENZAPRINE HCL 10 MG PO TABS
10.0000 mg | ORAL_TABLET | Freq: Three times a day (TID) | ORAL | Status: DC | PRN
  Administered 2021-06-22 – 2021-06-24 (×2): 10 mg via ORAL
  Filled 2021-06-22 (×2): qty 1

## 2021-06-22 MED ORDER — INSULIN GLARGINE-YFGN 100 UNIT/ML ~~LOC~~ SOLN
35.0000 [IU] | Freq: Every day | SUBCUTANEOUS | Status: DC
  Administered 2021-06-23 – 2021-06-24 (×2): 35 [IU] via SUBCUTANEOUS
  Filled 2021-06-22 (×3): qty 0.35

## 2021-06-22 MED ORDER — ALBUTEROL SULFATE (2.5 MG/3ML) 0.083% IN NEBU
2.5000 mg | INHALATION_SOLUTION | RESPIRATORY_TRACT | Status: DC | PRN
  Administered 2021-06-22 – 2021-06-25 (×8): 2.5 mg via RESPIRATORY_TRACT
  Filled 2021-06-22 (×8): qty 3

## 2021-06-22 MED ORDER — EZETIMIBE 10 MG PO TABS
10.0000 mg | ORAL_TABLET | Freq: Every day | ORAL | Status: DC
  Administered 2021-06-23 – 2021-06-25 (×3): 10 mg via ORAL
  Filled 2021-06-22 (×3): qty 1

## 2021-06-22 MED ORDER — ACETAMINOPHEN 650 MG RE SUPP: 650.0000 mg | Freq: Four times a day (QID) | RECTAL | Status: DC | PRN

## 2021-06-22 MED ORDER — CABERGOLINE 0.5 MG PO TABS: 0.5000 mg | ORAL_TABLET | ORAL | Status: DC

## 2021-06-22 MED ORDER — INSULIN ASPART 100 UNIT/ML IJ SOLN
0.0000 [IU] | Freq: Three times a day (TID) | INTRAMUSCULAR | Status: DC
Start: 2021-06-23 — End: 2021-06-25
  Administered 2021-06-23: 3 [IU] via SUBCUTANEOUS
  Administered 2021-06-23: 5 [IU] via SUBCUTANEOUS
  Administered 2021-06-24: 8 [IU] via SUBCUTANEOUS
  Administered 2021-06-24: 5 [IU] via SUBCUTANEOUS
  Administered 2021-06-25: 2 [IU] via SUBCUTANEOUS
  Filled 2021-06-22 (×5): qty 1

## 2021-06-22 MED ORDER — IOHEXOL 350 MG/ML SOLN
75.0000 mL | Freq: Once | INTRAVENOUS | Status: AC | PRN
  Administered 2021-06-22: 75 mL via INTRAVENOUS

## 2021-06-22 MED ORDER — ONDANSETRON HCL 4 MG PO TABS: 4.0000 mg | ORAL_TABLET | Freq: Four times a day (QID) | ORAL | Status: DC | PRN

## 2021-06-22 MED ORDER — PANTOPRAZOLE SODIUM 40 MG PO TBEC
40.0000 mg | DELAYED_RELEASE_TABLET | Freq: Every day | ORAL | Status: DC
  Administered 2021-06-22 – 2021-06-25 (×4): 40 mg via ORAL
  Filled 2021-06-22 (×4): qty 1

## 2021-06-22 MED ORDER — B COMPLEX-C PO TABS
1.0000 | ORAL_TABLET | Freq: Every day | ORAL | Status: DC
  Administered 2021-06-23 – 2021-06-25 (×3): 1 via ORAL
  Filled 2021-06-22 (×3): qty 1

## 2021-06-22 MED ORDER — MOMETASONE FURO-FORMOTEROL FUM 200-5 MCG/ACT IN AERO
2.0000 | INHALATION_SPRAY | Freq: Two times a day (BID) | RESPIRATORY_TRACT | Status: DC
  Administered 2021-06-22 – 2021-06-25 (×6): 2 via RESPIRATORY_TRACT
  Filled 2021-06-22 (×2): qty 8.8

## 2021-06-22 NOTE — ED Notes (Addendum)
Changed pt's sheets, put on fresh brief and purewick. Pt asking about home meds, specifically hydrocodone 7.5 that she takes Q6

## 2021-06-22 NOTE — ED Notes (Signed)
Pt provided lunch tray.

## 2021-06-22 NOTE — ED Notes (Signed)
Husband at bedside. Pt asked for and received tylenol for headache

## 2021-06-22 NOTE — H&P (Signed)
History and Physical    Alison Taylor [id number] DOB: Apr 11, 1956 DOA: 06/21/2021  PCP: Philmore Pali, NP   Patient coming from: home  I have personally briefly reviewed patient's old medical records in [place]  Chief Complaint: Shortness of breath  HPI: Alison Taylor is a 65 y.o. female with medical history significant for Severe COPD on nighttime oxygen, followed by pulmonology, nicotine dependence, acromegaly on cabergoline, OSA pending sleep study, who presented to the ED in severe respiratory distress.  Was hypoxic with EMS and placed on nonrebreather.  Solu-Medrol and duo nebs given in route.  Mild chest discomfort with breathing but denies nausea vomiting or diaphoresis and no fever or chills.  Patient states that she was short of breath but did not feel like she was wheezing typical for her COPD.  Denied cough fever or chills.  States symptoms started suddenly.  ED course: On arrival afebrile, BP 86/58, pulse 88 with respirations 26 oxygen saturation 100% on BiPAP to which she was transition Venous blood gas with pH 7.25 and PCO2 69.  Other labs with WBC 12,000, D-dimer 1.1, troponin 28-25  EKG, personally viewed and interpreted: Sinus rhythm at 90 with no acute ST-T wave changes  CTA chest negative for PE  Patient was treated with duo nebs, IV Solu-Medrol IV magnesium and improved and was able to transition from BiPAP to nasal cannula but continues to be on 6 L.  Hospitalist consulted for admission.  Review of Systems: As per HPI otherwise all other systems on review of systems negative.    Past Medical History:  Diagnosis Date   COPD (chronic obstructive pulmonary disease) ([place])    Lumbar stenosis    Spinal stenosis of sacral region    Thyroid disease    hypo   Tobacco abuse     History reviewed. No pertinent surgical history.   reports that she has been smoking cigarettes. She has been smoking an average of .5 packs per day. She does not have any  smokeless tobacco history on file. She reports that she does not drink alcohol and does not use drugs.  Allergies  Allergen Reactions   Keflex [Cephalexin] Hives   Ivp Dye [Iodinated Diagnostic Agents] Other (See Comments)    Reaction:  Redness    Sulfa Antibiotics Anxiety   Tylenol With Codeine #3 [Acetaminophen-Codeine] Palpitations    Family History  Problem Relation Age of Onset   ALS Brother    Breast cancer Neg Hx       Prior to Admission medications   Medication Sig Start Date End Date Taking? Authorizing Provider  albuterol (PROVENTIL HFA;VENTOLIN HFA) 108 (90 Base) MCG/ACT inhaler Inhale 2 puffs into the lungs every 6 (six) hours as needed for wheezing or shortness of breath. 12/15/15   Demetrios Loll, MD  albuterol (PROVENTIL HFA;VENTOLIN HFA) 108 [phone] Base) MCG/ACT inhaler Inhale 2 puffs into the lungs every 6 (six) hours as needed for wheezing or shortness of breath. 04/12/16   Joanne Gavel, MD  b complex vitamins tablet Take 1 tablet by mouth daily.    [provider]  benzonatate (TESSALON PERLES) 100 MG capsule Take 1 capsule (100 mg total) by mouth 3 (three) times daily as needed for cough. 12/15/15   Demetrios Loll, MD  cholecalciferol (VITAMIN D) 1000 units tablet Take 2,000 Units by mouth daily.    [provider]  cyclobenzaprine (FLEXERIL) 10 MG tablet Take 10 mg by mouth 3 (three) times daily as needed for  muscle spasms.    [provider]  Fluticasone-Salmeterol (ADVAIR) 250-50 MCG/DOSE AEPB Inhale 1 puff into the lungs 2 (two) times daily. 12/15/15   Demetrios Loll, MD  gabapentin (NEURONTIN) 800 MG tablet Take 800 mg by mouth 3 (three) times daily.    [provider]  HYDROcodone-acetaminophen (NORCO/VICODIN) 5-325 MG tablet Take 1 tablet by mouth every 6 (six) hours as needed for moderate pain.    [provider]  ipratropium-albuterol (DUONEB) 0.5-2.5 (3) MG/3ML SOLN Take 3 mLs by nebulization every 4 (four) hours as needed (for  wheezing/shortness of breath).    [provider]  levothyroxine (SYNTHROID, LEVOTHROID) 150 MCG tablet Take 150 mcg by mouth daily before breakfast.    [provider]  metoprolol tartrate (LOPRESSOR) 25 MG tablet Take 25 mg by mouth 2 (two) times daily.    [provider]  Multiple Vitamins-Minerals (OCUVITE EYE HEATLH GUMMIES PO) Take 2 each by mouth daily.    [provider]  omeprazole (PRILOSEC) 20 MG capsule Take 20 mg by mouth daily as needed (for acid reflux).    [provider]    Physical Exam: Vitals:   06/22/21 0141 06/22/21 0200 06/22/21 0230 06/22/21 0300  BP:  131/70 (!) 145/77 126/71  Pulse:  80 82 78  Resp:  (!) 22 (!) 22 19  Temp:      TempSrc:      SpO2: 94% 98% 97% 98%  Weight:      Height:         Vitals:   06/22/21 0141 06/22/21 0200 06/22/21 0230 06/22/21 0300  BP:  131/70 (!) 145/77 126/71  Pulse:  80 82 78  Resp:  (!) 22 (!) 22 19  Temp:      TempSrc:      SpO2: 94% 98% 97% 98%  Weight:      Height:          Constitutional: Alert and oriented x 3 .  Conversational dyspnea HEENT:      Head: Normocephalic and atraumatic.         Eyes: PERLA, EOMI, Conjunctivae are normal. Sclera is non-icteric.       Mouth/Throat: Mucous membranes are moist.       Neck: Supple with no signs of meningismus. Cardiovascular: Regular rate and rhythm. No murmurs, gallops, or rubs. 2+ symmetrical distal pulses are present . No JVD. No LE edema Respiratory: Respiratory effort increased, tachypneic.  Lung sounds diminished bilaterally. No wheezes, crackles, or rhonchi.  Gastrointestinal: Soft, non tender, and non distended with positive bowel sounds.  Genitourinary: No CVA tenderness. Musculoskeletal: Nontender with normal range of motion in all extremities. No cyanosis, or erythema of extremities. Neurologic:  Face is symmetric. Moving all extremities. No gross focal neurologic deficits . Skin: Skin is warm, dry.  No rash or  ulcers Psychiatric: Mood and affect are normal    Labs on Admission: I have personally reviewed following labs and imaging studies  CBC: Recent Labs  Lab 06/21/21 2150  WBC 12.1*  HGB 14.5  HCT 43.0  MCV 91.1  PLT [id number]   Basic Metabolic Panel: Recent Labs  Lab 06/21/21 2150  NA 138  K 4.6  CL 100  CO2 28  GLUCOSE 197*  BUN 13  CREATININE 1.16*  CALCIUM 9.3   GFR: Estimated Creatinine Clearance: 54.5 mL/min (A) (by C-G formula based on SCr of 1.16 mg/dL (H)). Liver Function Tests: Recent Labs  Lab 06/21/21 2150  AST 46*  ALT 49*  ALKPHOS 114  BILITOT 0.7  PROT 7.5  ALBUMIN 4.4   No results for input(s): LIPASE, AMYLASE in the last 168 hours. No results for input(s): AMMONIA in the last 168 hours. Coagulation Profile: Recent Labs  Lab 06/21/21 2321  INR 1.0   Cardiac Enzymes: No results for input(s): CKTOTAL, CKMB, CKMBINDEX, TROPONINI in the last 168 hours. BNP (last 3 results) No results for input(s): PROBNP in the last 8760 hours. HbA1C: No results for input(s): HGBA1C in the last 72 hours. CBG: No results for input(s): GLUCAP in the last 168 hours. Lipid Profile: No results for input(s): CHOL, HDL, LDLCALC, TRIG, CHOLHDL, LDLDIRECT in the last 72 hours. Thyroid Function Tests: No results for input(s): TSH, T4TOTAL, FREET4, T3FREE, THYROIDAB in the last 72 hours. Anemia Panel: No results for input(s): VITAMINB12, FOLATE, FERRITIN, TIBC, IRON, RETICCTPCT in the last 72 hours. Urine analysis: No results found for: COLORURINE, APPEARANCEUR, LABSPEC, PHURINE, GLUCOSEU, HGBUR, BILIRUBINUR, KETONESUR, PROTEINUR, UROBILINOGEN, NITRITE, LEUKOCYTESUR  Radiological Exams on Admission: CT HEAD WO CONTRAST (5MM)  Result Date: 06/21/2021 CLINICAL DATA:  Altered mental status EXAM: CT HEAD WITHOUT CONTRAST TECHNIQUE: Contiguous axial images were obtained from the base of the skull through the vertex without intravenous contrast. COMPARISON:  None. FINDINGS:  Brain: Old left basal ganglia lacunar infarct. No acute intracranial abnormality. Specifically, no hemorrhage, hydrocephalus, mass lesion, acute infarction, or significant intracranial injury. Vascular: No hyperdense vessel or unexpected calcification. Skull: No acute calvarial abnormality. Sinuses/Orbits: No acute findings Other: None IMPRESSION: Old left basal ganglia lacunar infarct. No acute intracranial abnormality. Electronically Signed   By: Rolm Baptise M.D.   On: 06/21/2021 23:13   CT Angio Chest PE W and/or Wo Contrast  Result Date: 06/22/2021 CLINICAL DATA:  Positive D-dimer EXAM: CT ANGIOGRAPHY CHEST WITH CONTRAST TECHNIQUE: Multidetector CT imaging of the chest was performed using the standard protocol during bolus administration of intravenous contrast. Multiplanar CT image reconstructions and MIPs were obtained to evaluate the vascular anatomy. CONTRAST:  54m OMNIPAQUE IOHEXOL 350 MG/ML SOLN COMPARISON:  Chest x-ray from earlier in the same day, CT from 06/14/2019 FINDINGS: Cardiovascular: Thoracic aorta demonstrates atherosclerotic calcifications without aneurysmal dilatation or dissection. Irregular soft atherosclerotic plaque is noted in the descending thoracic aorta. The pulmonary artery demonstrates a normal branching pattern bilaterally. No focal filling defect to suggest pulmonary embolism is noted. Mediastinum/Nodes: Thoracic inlet is within normal limits. No sizable hilar or mediastinal adenopathy is noted. The esophagus as visualized is within normal limits. Lungs/Pleura: Diffuse emphysematous changes are identified. The lungs are well aerated bilaterally. No focal infiltrate or sizable effusion is seen. Mild dependent atelectatic changes in the right base are noted. Upper Abdomen: Visualized upper abdomen shows no acute abnormality. Musculoskeletal: Degenerative changes of the thoracic spine are noted. No acute rib abnormality is noted. Review of the MIP images confirms the above  findings. IMPRESSION: No evidence of pulmonary emboli. Mild dependent right basilar atelectasis. Aortic Atherosclerosis (ICD10-I70.0) and Emphysema (ICD10-J43.9). Electronically Signed   By: MInez CatalinaM.D.   On: 06/22/2021 03:43   DG Chest Portable 1 View  Result Date: 06/21/2021 CLINICAL DATA:  Respiratory distress EXAM: PORTABLE CHEST 1 VIEW COMPARISON:  01/17/2021 FINDINGS: Cardiac shadow is within normal limits. Aortic calcifications are again seen. Lungs are well aerated bilaterally. No focal infiltrate or sizable effusion is seen. Mild interstitial changes are noted of a chronic nature. IMPRESSION: No active disease. Electronically Signed   By: MInez CatalinaM.D.   On: 06/21/2021 22:07     Assessment/Plan  65 year old female with severe COPD on nighttime oxygen, followed by pulmonology, nicotine dependence, acromegaly on cabergoline, OSA pending sleep study, who presented to the ED in severe respiratory distress.      COPD exacerbation ([place])   Acute respiratory failure with hypercapnia - Patient on nighttime O2 presenting in acute respiratory distress requiring BiPAP, PCO2 69 - CTA chest negative for PE - Scheduled and as needed nebulized bronchodilator treatments - IV steroids - Mucolytic's - Now on O2 at 6 L after transitioning from BiPAP - Patient usually followed by pulmonology.  Consider consult  Nicotine dependence - Nicotine patch  OSA - Patient awaiting sleep study  ?  Acromegaly - Continue cabergoline - Followed by endocrine    DVT prophylaxis: Lovenox  Code Status: full code  Family Communication:  none  Disposition Plan: Back to previous home environment Consults called: none  Status:At the time of admission, it appears that the appropriate admission status for this patient is INPATIENT. This is judged to be reasonable and necessary in order to provide the required intensity of service to ensure the patient's safety given the presenting symptoms, physical exam  findings, and initial radiographic and laboratory data in the context of their  Comorbid conditions.   Patient requires inpatient status due to high intensity of service, high risk for further deterioration and high frequency of surveillance required.   I certify that at the point of admission it is my clinical judgment that the patient will require inpatient hospital care spanning beyond [place] MD [facility]     06/22/2021, 4:39 AM

## 2021-06-22 NOTE — ED Notes (Signed)
Tried to contact family Caren Griffins, twice no answer.

## 2021-06-22 NOTE — ED Notes (Signed)
Pt reports feeling better today than yesterday. Pt requesting breakfast, tray ordered.

## 2021-06-22 NOTE — Progress Notes (Signed)
Pt arrived to floor via bed alert and oriented x 4. VSS. 3LNC. Pt oriented to room and call light. Bed in lowest position.

## 2021-06-22 NOTE — Progress Notes (Signed)
Patient ID: Alison Taylor, female   DOB: [birth year], 65 y.o.   MRN: [id number]  This is a no charge note as patient was admitted this AM.  H&P reviewed and patient was seen and examined.  Alison Taylor is a 65 y.o. female with medical history significant for Severe COPD on nighttime oxygen, followed by pulmonology, nicotine dependence, acromegaly on cabergoline, OSA pending sleep study, who presented to the ED in severe respiratory distress.  Was hypoxic with EMS and placed on nonrebreather.  Solu-Medrol and duo nebs given in route.  Mild chest discomfort with breathing but denies nausea vomiting or diaphoresis and no fever or chills.  Patient states that she was short of breath but did not feel like she was wheezing typical for her COPD.  Denied cough fever or chills.  States symptoms started suddenly.   ED course: On arrival afebrile, BP 86/58, pulse 88 with respirations 26 oxygen saturation 100% on BiPAP to which she was transition Venous blood gas with pH 7.25 and PCO2 69.  Other labs with WBC 12,000, D-dimer 1.1, troponin 28-25 CTA negative for PE  Patient reports feeling better than last night but not at baseline yet.  Nad, calm Decreased air exchange, wheezing Regular S1-S2 no gallops Soft benign positive bowel sounds No edema  A/P:  Consult Dr. Raul Del her pccm- spoke via phone , added [facility] to current management. Continue IV steroids and inhalers Keep O2 sats above 92% with supplemental oxygen

## 2021-06-22 NOTE — Progress Notes (Signed)
Patient off bipap on 4 liter nasal cannula. O2 saturation noted at 98% tolerated well.

## 2021-06-22 NOTE — ED Notes (Signed)
Pt received dinner tray.

## 2021-06-22 NOTE — ED Notes (Signed)
Pt asleep in room, respirs even and unlabored

## 2021-06-22 NOTE — ED Provider Notes (Signed)
-----------------------------------------   4:24 AM on 06/22/2021 ----------------------------------------- CTA of the chest is negative.  Patient is now off BiPAP and on 6 L nasal cannula oxygen.  We will admit to the hospital service for COPD exacerbation.  Patient agreeable to plan of care.   Harvest Dark, MD 06/22/21 [id number]

## 2021-06-22 NOTE — ED Notes (Signed)
Pt provided cup of coffee to enjoy after neb treatment she requested.

## 2021-06-22 NOTE — ED Notes (Signed)
Pt given a cup of coffee. Pt ate 50% of breakfast. Pt has no further needs at this time.

## 2021-06-23 ENCOUNTER — Other Ambulatory Visit: Payer: Self-pay

## 2021-06-23 ENCOUNTER — Encounter: Payer: Self-pay | Admitting: Internal Medicine

## 2021-06-23 DIAGNOSIS — E039 Hypothyroidism, unspecified: Secondary | ICD-10-CM

## 2021-06-23 LAB — SEDIMENTATION RATE: Sed Rate: 6 mm/hr (ref 0–30)

## 2021-06-23 LAB — GLUCOSE, CAPILLARY
Glucose-Capillary: 102 mg/dL — ABNORMAL HIGH (ref 70–99)
Glucose-Capillary: 159 mg/dL — ABNORMAL HIGH (ref 70–99)
Glucose-Capillary: 248 mg/dL — ABNORMAL HIGH (ref 70–99)
Glucose-Capillary: 228 mg/dL — ABNORMAL HIGH (ref 70–99)

## 2021-06-23 LAB — BRAIN NATRIURETIC PEPTIDE: B Natriuretic Peptide: 565.1 pg/mL — ABNORMAL HIGH (ref 0.0–100.0)

## 2021-06-23 LAB — C-REACTIVE PROTEIN: CRP: 1.3 mg/dL — ABNORMAL HIGH (ref <1.0)

## 2021-06-23 LAB — HEMOGLOBIN A1C
Hgb A1c MFr Bld: 6.4 % — ABNORMAL HIGH (ref 4.8–5.6)
Mean Plasma Glucose: 136.98 mg/dL

## 2021-06-23 MED ORDER — AZITHROMYCIN 250 MG PO TABS
500.0000 mg | ORAL_TABLET | Freq: Every day | ORAL | Status: AC
  Administered 2021-06-23: 500 mg via ORAL
  Filled 2021-06-23: qty 2

## 2021-06-23 MED ORDER — FUROSEMIDE 10 MG/ML IJ SOLN
20.0000 mg | Freq: Once | INTRAMUSCULAR | Status: AC
  Administered 2021-06-23: 20 mg via INTRAVENOUS
  Filled 2021-06-23: qty 2

## 2021-06-23 MED ORDER — METHYLPREDNISOLONE SODIUM SUCC 40 MG IJ SOLR
40.0000 mg | Freq: Two times a day (BID) | INTRAMUSCULAR | Status: DC
  Administered 2021-06-23 – 2021-06-24 (×3): 40 mg via INTRAVENOUS
  Filled 2021-06-23 (×3): qty 1

## 2021-06-23 MED ORDER — AMOXICILLIN-POT CLAVULANATE 875-125 MG PO TABS
1.0000 | ORAL_TABLET | Freq: Two times a day (BID) | ORAL | Status: DC
  Administered 2021-06-23 – 2021-06-25 (×4): 1 via ORAL
  Filled 2021-06-23 (×4): qty 1

## 2021-06-23 MED ORDER — AZITHROMYCIN 250 MG PO TABS
250.0000 mg | ORAL_TABLET | Freq: Every day | ORAL | Status: DC
Start: 2021-06-24 — End: 2021-06-23

## 2021-06-23 NOTE — Consult Note (Signed)
Pulmonary Medicine          Date: 06/23/2021,   MRN# [id number] Alison Taylor April 28, 1956     AdmissionWeight: 81.6 kg                 CurrentWeight: 81.6 kg   Referring physician: Dr Alison Taylor:   CHIEF COMPLAINT:   Acute exacerbation of COPD   HISTORY OF PRESENT ILLNESS   As per admission h/p Alison Taylor is a 66 y.o. female with medical history significant for Severe COPD on nighttime oxygen, followed by pulmonology, nicotine dependence, acromegaly on cabergoline, OSA pending sleep study, who presented to the ED in severe respiratory distress.  Was hypoxic with EMS and placed on nonrebreather.  Solu-Medrol and duo nebs given in route.  Mild chest discomfort with breathing but denies nausea vomiting or diaphoresis and no fever or chills.  Patient states that she was short of breath but did not feel like she was wheezing typical for her COPD.  Denied cough fever or chills.  States symptoms started suddenly. On arrival afebrile, BP 86/58, pulse 88 with respirations 26 oxygen saturation 100% on BiPAP to which she was transition Venous blood gas with pH 7.25 and PCO2 69.  Other labs with WBC 12,000, D-dimer 1.1, troponin 28-25.  PCCM consulted for further evaluation and management.   PAST MEDICAL HISTORY   Past Medical History:  Diagnosis Date   COPD (chronic obstructive pulmonary disease) ([place])    Lumbar stenosis    Spinal stenosis of sacral region    Thyroid disease    hypo   Tobacco abuse      SURGICAL HISTORY   History reviewed. No pertinent surgical history.   FAMILY HISTORY   Family History  Problem Relation Age of Onset   ALS Brother    Breast cancer Neg Hx      SOCIAL HISTORY   Social History   Tobacco Use   Smoking status: Every Day    Packs/day: 0.50    Types: Cigarettes   Smokeless tobacco: Never  Vaping Use   Vaping Use: Never used  Substance Use Topics   Alcohol use: No    Alcohol/week: 0.0 standard drinks   Drug use: No      MEDICATIONS    Home Medication:    Current Medication:  Current Facility-Administered Medications:    acetaminophen (TYLENOL) tablet 650 mg, 650 mg, Oral, Q6H PRN, 650 mg at 06/22/21 1022 **OR** acetaminophen (TYLENOL) suppository 650 mg, 650 mg, Rectal, Q6H PRN, Alison Masse, MD   albuterol (PROVENTIL) (2.5 MG/3ML) 0.083% nebulizer solution 2.5 mg, 2.5 mg, Nebulization, Q2H PRN, Alison Gaudier V, MD, 2.5 mg at 06/23/21 1026   [COMPLETED] azithromycin (ZITHROMAX) tablet 500 mg, 500 mg, Oral, Daily, 500 mg at 06/23/21 1122 **FOLLOWED BY** [START ON 06/24/2021] azithromycin (ZITHROMAX) tablet 250 mg, 250 mg, Oral, Daily, Alison Taylor, Sahar, MD   B-complex with vitamin C tablet 1 tablet, 1 tablet, Oral, Daily, Alison Gaudier V, MD, 1 tablet at 06/23/21 0931   [START ON 06/24/2021] cabergoline (DOSTINEX) tablet 0.5 mg, 0.5 mg, Oral, Once per day on Mon Wed Fri, Alison Taylor, Alison V, MD   cyclobenzaprine (FLEXERIL) tablet 10 mg, 10 mg, Oral, TID PRN, Alison Masse, MD, 10 mg at 06/22/21 2302   enoxaparin (LOVENOX) injection 40 mg, 40 mg, Subcutaneous, Q24H, Alison Gaudier V, MD, 40 mg at 06/22/21 2304   ezetimibe (ZETIA) tablet 10 mg, 10 mg, Oral, Daily, Alison Hanlon, MD, 10 mg at 06/23/21  3546   gabapentin (NEURONTIN) capsule 800 mg, 800 mg, Oral, TID, Alison Masse, MD, 800 mg at 06/23/21 0931   HYDROcodone-acetaminophen (NORCO) 7.5-325 MG per tablet 1 tablet, 1 tablet, Oral, QID, Alison Masse, MD, 1 tablet at 06/23/21 1359   HYDROcodone-acetaminophen (NORCO/VICODIN) 5-325 MG per tablet 1 tablet, 1 tablet, Oral, Q4H PRN, Alison Hanlon, MD, 1 tablet at 06/22/21 2010   insulin aspart (novoLOG) injection 0-15 Units, 0-15 Units, Subcutaneous, TID WC, Alison Masse, MD, 3 Units at 06/23/21 1349   insulin aspart (novoLOG) injection 0-5 Units, 0-5 Units, Subcutaneous, QHS, Alison Masse, MD, 2 Units at 06/22/21 2304   insulin glargine-yfgn (SEMGLEE) injection 35 Units, 35 Units, Subcutaneous, Q1200,  Alison Masse, MD   ipratropium-albuterol (DUONEB) 0.5-2.5 (3) MG/3ML nebulizer solution 3 mL, 3 mL, Nebulization, Q6H, Alison Masse, MD, 3 mL at 06/23/21 1322   levothyroxine (SYNTHROID) tablet 137 mcg, 137 mcg, Oral, QAC breakfast, Alison Hanlon, MD, 137 mcg at 06/23/21 0931   methylPREDNISolone sodium succinate (SOLU-MEDROL) 40 mg/mL injection 40 mg, 40 mg, Intravenous, Q12H, Alison Hanlon, MD, 40 mg at 06/23/21 0931   mometasone-formoterol (DULERA) 200-5 MCG/ACT inhaler 2 puff, 2 puff, Inhalation, BID, Alison Hanlon, MD, 2 puff at 06/23/21 0930   ondansetron (ZOFRAN) tablet 4 mg, 4 mg, Oral, Q6H PRN **OR** ondansetron (ZOFRAN) injection 4 mg, 4 mg, Intravenous, Q6H PRN, Alison Masse, MD   pantoprazole (PROTONIX) EC tablet 40 mg, 40 mg, Oral, Daily, Alison Taylor, Alison Albright, MD, 40 mg at 06/23/21 0931   venlafaxine XR (EFFEXOR-XR) 24 hr capsule 75 mg, 75 mg, Oral, Daily, Alison Hanlon, MD, 75 mg at 06/23/21 0931    ALLERGIES   Iodine, Keflex [cephalexin], Ivp dye [iodinated diagnostic agents], Sulfa antibiotics, and Tylenol with codeine #3 [acetaminophen-codeine]     REVIEW OF SYSTEMS    Review of Systems:  Gen:  Denies  fever, sweats, chills weigh loss  HEENT: Denies blurred vision, double vision, ear pain, eye pain, hearing loss, nose bleeds, sore throat Cardiac:  No dizziness, chest pain or heaviness, chest tightness,edema Resp:   Denies cough or sputum porduction, shortness of breath,wheezing, hemoptysis,  Gi: Denies swallowing difficulty, stomach pain, nausea or vomiting, diarrhea, constipation, bowel incontinence Gu:  Denies bladder incontinence, burning urine Ext:   Denies Joint pain, stiffness or swelling Skin: Denies  skin rash, easy bruising or bleeding or hives Endoc:  Denies polyuria, polydipsia , polyphagia or weight change Psych:   Denies depression, insomnia or hallucinations   Other:  All other systems negative   VS: BP (!) 116/55 (BP Location: Left Arm)   Pulse 75    Temp 98.6 F (37 C)   Resp 19   Ht '5\' 7"'  (1.702 m)   Wt 81.6 kg   LMP 12/10/1987   SpO2 95%   BMI 28.19 kg/m      PHYSICAL EXAM    GENERAL:NAD, no fevers, chills, no weakness no fatigue HEAD: Normocephalic, atraumatic.  EYES: Pupils equal, round, reactive to light. Extraocular muscles intact. No scleral icterus.  MOUTH: Moist mucosal membrane. Dentition intact. No abscess noted.  EAR, NOSE, THROAT: Clear without exudates. No external lesions.  NECK: Supple. No thyromegaly. No nodules. No JVD.  PULMONARY: Mild crackles at bases   posteriorly CARDIOVASCULAR: S1 and S2. Regular rate and rhythm. No murmurs, rubs, or gallops. No edema. Pedal pulses 2+ bilaterally.  GASTROINTESTINAL: Soft, nontender, nondistended. No masses. Positive bowel sounds. No hepatosplenomegaly.  MUSCULOSKELETAL: No swelling, clubbing, or edema. Range of motion  full in all extremities.  NEUROLOGIC: Cranial nerves II through XII are intact. No gross focal neurological deficits. Sensation intact. Reflexes intact.  SKIN: No ulceration, lesions, rashes, or cyanosis. Skin warm and dry. Turgor intact.  PSYCHIATRIC: Mood, affect within normal limits. The patient is awake, alert and oriented x 3. Insight, judgment intact.       IMAGING        CT HEAD WO CONTRAST (5MM)  Result Date: 06/21/2021 CLINICAL DATA:  Altered mental status EXAM: CT HEAD WITHOUT CONTRAST TECHNIQUE: Contiguous axial images were obtained from the base of the skull through the vertex without intravenous contrast. COMPARISON:  None. FINDINGS: Brain: Old left basal ganglia lacunar infarct. No acute intracranial abnormality. Specifically, no hemorrhage, hydrocephalus, mass lesion, acute infarction, or significant intracranial injury. Vascular: No hyperdense vessel or unexpected calcification. Skull: No acute calvarial abnormality. Sinuses/Orbits: No acute findings Other: None IMPRESSION: Old left basal ganglia lacunar infarct. No acute intracranial  abnormality. Electronically Signed   By: Rolm Baptise M.D.   On: 06/21/2021 23:13   CT Angio Chest PE W and/or Wo Contrast  Result Date: 06/22/2021 CLINICAL DATA:  Positive D-dimer EXAM: CT ANGIOGRAPHY CHEST WITH CONTRAST TECHNIQUE: Multidetector CT imaging of the chest was performed using the standard protocol during bolus administration of intravenous contrast. Multiplanar CT image reconstructions and MIPs were obtained to evaluate the vascular anatomy. CONTRAST:  29m OMNIPAQUE IOHEXOL 350 MG/ML SOLN COMPARISON:  Chest x-ray from earlier in the same day, CT from 06/14/2019 FINDINGS: Cardiovascular: Thoracic aorta demonstrates atherosclerotic calcifications without aneurysmal dilatation or dissection. Irregular soft atherosclerotic plaque is noted in the descending thoracic aorta. The pulmonary artery demonstrates a normal branching pattern bilaterally. No focal filling defect to suggest pulmonary embolism is noted. Mediastinum/Nodes: Thoracic inlet is within normal limits. No sizable hilar or mediastinal adenopathy is noted. The esophagus as visualized is within normal limits. Lungs/Pleura: Diffuse emphysematous changes are identified. The lungs are well aerated bilaterally. No focal infiltrate or sizable effusion is seen. Mild dependent atelectatic changes in the right base are noted. Upper Abdomen: Visualized upper abdomen shows no acute abnormality. Musculoskeletal: Degenerative changes of the thoracic spine are noted. No acute rib abnormality is noted. Review of the MIP images confirms the above findings. IMPRESSION: No evidence of pulmonary emboli. Mild dependent right basilar atelectasis. Aortic Atherosclerosis (ICD10-I70.0) and Emphysema (ICD10-J43.9). Electronically Signed   By: MInez CatalinaM.D.   On: 06/22/2021 03:43   DG Chest Portable 1 View  Result Date: 06/21/2021 CLINICAL DATA:  Respiratory distress EXAM: PORTABLE CHEST 1 VIEW COMPARISON:  01/17/2021 FINDINGS: Cardiac shadow is within normal  limits. Aortic calcifications are again seen. Lungs are well aerated bilaterally. No focal infiltrate or sizable effusion is seen. Mild interstitial changes are noted of a chronic nature. IMPRESSION: No active disease. Electronically Signed   By: MInez CatalinaM.D.   On: 06/21/2021 22:07    INTERPRETATION  NORMAL LEFT VENTRICULAR SYSTOLIC FUNCTION   WITH MILD LVH  NORMAL RIGHT VENTRICULAR SYSTOLIC FUNCTION  TRIVIAL REGURGITATION NOTED (See above)  NO VALVULAR STENOSIS  Closest EF: >55% (Estimated)  Mitral: TRIVIAL MR  Tricuspid: TRIVIAL TR  _________________________________________________________________________________________  Electronically signed by            MD KJordan Hawkson 12/22/2020 08: 53 AM           Performed By: MCephus Shelling RVT     Ordering Physician: FUbaldo Glassing MD Ken  _________________________________________________________________________________________  All Measurements  Exam End: 12/19/20 13:45   Specimen Collected: 12/19/20 13:15 Last Resulted: 12/22/20 08:53  Received From: [place]     ASSESSMENT/PLAN   Acute on chronic hypoxemic respiratory failure - present on admission -patient reports  exposure to  bleach  which   set  off the   exacerbation - COVID19 negative     - supplemental O2 during my evaluation 3L - will perform infectious workup for pneumonia -Respiratory viral panel -serum fungitell -legionella ab -strep pneumoniae ur AG -reviewed pertinent imaging with patient today - ESR -PT/OT for d/c planning  -please encourage patient to use incentive spirometer few times each hour while hospitalized.   -patient generally responds well toaugmentin and  shares  that zithromax  has not been effective inpast.            Thank you for allowing me to participate in the care of this patient.  Total face to face encounter time for this patient visit was  45 min. >50%  of the time was  spent in counseling and coordination of care.   Patient/Family are satisfied with care plan and all questions have been answered.  This document was prepared using Dragon voice recognition software and may include unintentional dictation errors.     Ottie Glazier, M.D.  Division of [place]

## 2021-06-23 NOTE — Progress Notes (Signed)
Patient requested PRN neb treatment.  Called respiratory to notify.

## 2021-06-23 NOTE — Progress Notes (Signed)
PROGRESS NOTE    Alison Taylor  [id number] DOB: Nov 27, 1955 DOA: 06/21/2021 PCP: Philmore Pali, NP    Brief Narrative:  Alison Taylor is a 65 y.o. female with medical history significant for Severe COPD on nighttime oxygen, followed by pulmonology, nicotine dependence, acromegaly on cabergoline, OSA pending sleep study, who presented to the ED in severe respiratory distress.  Was hypoxic with EMS and placed on nonrebreather.  Solu-Medrol and duo nebs given in route.  Mild chest discomfort with breathing but denies nausea vomiting or diaphoresis and no fever or chills.  Patient states that she was short of breath but did not feel like she was wheezing typical for her COPD.  Denied cough fever or chills.  States symptoms started suddenly.   ED course: On arrival afebrile, BP 86/58, pulse 88 with respirations 26 oxygen saturation 100% on BiPAP to which she was transition Venous blood gas with pH 7.25 and PCO2 69.  Other labs with WBC 12,000, D-dimer 1.1, troponin 28-25 CTA negative for PE   8/9-patient is short of breath today.  Feels her shortness of breath may be worse than yesterday.  Coughing.  No chest pain.   Consultants:  PCCM Dr. Raul Del  Procedures:   Antimicrobials:      Subjective: No chest pain, dizziness  Objective: Vitals:   06/23/21 0101 06/23/21 0455 06/23/21 0718 06/23/21 0736  BP:  (!) 160/66 (!) 161/76   Pulse:  (!) 59 72   Resp:  16 19   Temp:  97.6 F (36.4 C) 97.9 F (36.6 C)   TempSrc:      SpO2: 96% 97% 96% 95%  Weight:      Height:        Intake/Output Summary (Last 24 hours) at 06/23/2021 0827 Last data filed at 06/23/2021 0400 Gross per 24 hour  Intake --  Output 900 ml  Net -900 ml   Filed Weights   06/21/21 2151  Weight: 81.6 kg    Examination:  General exam: Calm, becomes short of breath with conversation Respiratory system: Decreased air exchange, increased expiratory time, mild expiratory wheezing cardiovascular system: S1 & S2  heard, RRR. No JVD, murmurs, rubs, gallops or clicks.  Gastrointestinal system: Abdomen is nondistended, soft and nontender.Normal bowel sounds heard. Central nervous system: Alert and oriented.  Grossly intact extremities: Trace pedal edema Skin: Warm dry Psychiatry: Judgement and insight appear normal. Mood & affect appropriate.     Data Reviewed: I have personally reviewed following labs and imaging studies  CBC: Recent Labs  Lab 06/21/21 2150 06/22/21 0623  WBC 12.1* 11.6*  HGB 14.5 12.6  HCT 43.0 37.6  MCV 91.1 90.0  PLT 344 [id number]   Basic Metabolic Panel: Recent Labs  Lab 06/21/21 2150 06/22/21 0623  NA 138  --   K 4.6  --   CL 100  --   CO2 28  --   GLUCOSE 197*  --   BUN 13  --   CREATININE 1.16* 1.05*  CALCIUM 9.3  --    GFR: Estimated Creatinine Clearance: 60.3 mL/min (A) (by C-G formula based on SCr of 1.05 mg/dL (H)). Liver Function Tests: Recent Labs  Lab 06/21/21 2150  AST 46*  ALT 49*  ALKPHOS 114  BILITOT 0.7  PROT 7.5  ALBUMIN 4.4   No results for input(s): LIPASE, AMYLASE in the last 168 hours. No results for input(s): AMMONIA in the last 168 hours. Coagulation Profile: Recent Labs  Lab 06/21/21 2321  INR 1.0  Cardiac Enzymes: No results for input(s): CKTOTAL, CKMB, CKMBINDEX, TROPONINI in the last 168 hours. BNP (last 3 results) No results for input(s): PROBNP in the last 8760 hours. HbA1C: Recent Labs    06/22/21 0623  HGBA1C 6.4*   CBG: Recent Labs  Lab 06/22/21 2240 06/23/21 0721  GLUCAP 249* 102*   Lipid Profile: No results for input(s): CHOL, HDL, LDLCALC, TRIG, CHOLHDL, LDLDIRECT in the last 72 hours. Thyroid Function Tests: No results for input(s): TSH, T4TOTAL, FREET4, T3FREE, THYROIDAB in the last 72 hours. Anemia Panel: No results for input(s): VITAMINB12, FOLATE, FERRITIN, TIBC, IRON, RETICCTPCT in the last 72 hours. Sepsis Labs: No results for input(s): PROCALCITON, LATICACIDVEN in the last 168  hours.  Recent Results (from the past 240 hour(s))  Resp Panel by RT-PCR (Flu A&B, Covid) Nasopharyngeal Swab     Status: None   Collection Time: 06/22/21  9:46 AM   Specimen: Nasopharyngeal Swab; Nasopharyngeal(NP) swabs in vial transport medium  Result Value Ref Range Status   SARS Coronavirus 2 by RT PCR NEGATIVE NEGATIVE Final    Comment: (NOTE) SARS-CoV-2 target nucleic acids are NOT DETECTED.  The SARS-CoV-2 RNA is generally detectable in upper respiratory specimens during the acute phase of infection. The lowest concentration of SARS-CoV-2 viral copies this assay can detect is 138 copies/mL. A negative result does not preclude SARS-Cov-2 infection and should not be used as the sole basis for treatment or other patient management decisions. A negative result may occur with  improper specimen collection/handling, submission of specimen other than nasopharyngeal swab, presence of viral mutation(s) within the areas targeted by this assay, and inadequate number of viral copies(<138 copies/mL). A negative result must be combined with clinical observations, patient history, and epidemiological information. The expected result is Negative.  Fact Sheet for Patients:  EntrepreneurPulse.com.au  Fact Sheet for Healthcare Providers:  IncredibleEmployment.be  This test is no t yet approved or cleared by the [place] FDA and  has been authorized for detection and/or diagnosis of SARS-CoV-2 by FDA under an Emergency Use Authorization (EUA). This EUA will remain  in effect (meaning this test can be used) for the duration of the COVID-19 declaration under Section 564(b)(1) of the Act, 21 U.S.C.section 360bbb-3(b)(1), unless the authorization is terminated  or revoked sooner.       Influenza A by PCR NEGATIVE NEGATIVE Final   Influenza B by PCR NEGATIVE NEGATIVE Final    Comment: (NOTE) The Xpert Xpress SARS-CoV-2/FLU/RSV plus assay is intended  as an aid in the diagnosis of influenza from Nasopharyngeal swab specimens and should not be used as a sole basis for treatment. Nasal washings and aspirates are unacceptable for Xpert Xpress SARS-CoV-2/FLU/RSV testing.  Fact Sheet for Patients: EntrepreneurPulse.com.au  Fact Sheet for Healthcare Providers: IncredibleEmployment.be  This test is not yet approved or cleared by the [place] FDA and has been authorized for detection and/or diagnosis of SARS-CoV-2 by FDA under an Emergency Use Authorization (EUA). This EUA will remain in effect (meaning this test can be used) for the duration of the COVID-19 declaration under Section 564(b)(1) of the Act, 21 U.S.C. section 360bbb-3(b)(1), unless the authorization is terminated or revoked.  Performed at [facility], [address] [place], [place] [address]          Radiology Studies: CT HEAD WO CONTRAST (5MM)  Result Date: 06/21/2021 CLINICAL DATA:  Altered mental status EXAM: CT HEAD WITHOUT CONTRAST TECHNIQUE: Contiguous axial images were obtained from the base of the skull through the vertex  without intravenous contrast. COMPARISON:  None. FINDINGS: Brain: Old left basal ganglia lacunar infarct. No acute intracranial abnormality. Specifically, no hemorrhage, hydrocephalus, mass lesion, acute infarction, or significant intracranial injury. Vascular: No hyperdense vessel or unexpected calcification. Skull: No acute calvarial abnormality. Sinuses/Orbits: No acute findings Other: None IMPRESSION: Old left basal ganglia lacunar infarct. No acute intracranial abnormality. Electronically Signed   By: Rolm Baptise M.D.   On: 06/21/2021 23:13   CT Angio Chest PE W and/or Wo Contrast  Result Date: 06/22/2021 CLINICAL DATA:  Positive D-dimer EXAM: CT ANGIOGRAPHY CHEST WITH CONTRAST TECHNIQUE: Multidetector CT imaging of the chest was performed using the standard protocol during bolus  administration of intravenous contrast. Multiplanar CT image reconstructions and MIPs were obtained to evaluate the vascular anatomy. CONTRAST:  55m OMNIPAQUE IOHEXOL 350 MG/ML SOLN COMPARISON:  Chest x-ray from earlier in the same day, CT from 06/14/2019 FINDINGS: Cardiovascular: Thoracic aorta demonstrates atherosclerotic calcifications without aneurysmal dilatation or dissection. Irregular soft atherosclerotic plaque is noted in the descending thoracic aorta. The pulmonary artery demonstrates a normal branching pattern bilaterally. No focal filling defect to suggest pulmonary embolism is noted. Mediastinum/Nodes: Thoracic inlet is within normal limits. No sizable hilar or mediastinal adenopathy is noted. The esophagus as visualized is within normal limits. Lungs/Pleura: Diffuse emphysematous changes are identified. The lungs are well aerated bilaterally. No focal infiltrate or sizable effusion is seen. Mild dependent atelectatic changes in the right base are noted. Upper Abdomen: Visualized upper abdomen shows no acute abnormality. Musculoskeletal: Degenerative changes of the thoracic spine are noted. No acute rib abnormality is noted. Review of the MIP images confirms the above findings. IMPRESSION: No evidence of pulmonary emboli. Mild dependent right basilar atelectasis. Aortic Atherosclerosis (ICD10-I70.0) and Emphysema (ICD10-J43.9). Electronically Signed   By: MInez CatalinaM.D.   On: 06/22/2021 03:43   DG Chest Portable 1 View  Result Date: 06/21/2021 CLINICAL DATA:  Respiratory distress EXAM: PORTABLE CHEST 1 VIEW COMPARISON:  01/17/2021 FINDINGS: Cardiac shadow is within normal limits. Aortic calcifications are again seen. Lungs are well aerated bilaterally. No focal infiltrate or sizable effusion is seen. Mild interstitial changes are noted of a chronic nature. IMPRESSION: No active disease. Electronically Signed   By: MInez CatalinaM.D.   On: 06/21/2021 22:07        Scheduled Meds:  B-complex  with vitamin C  1 tablet Oral Daily   [START ON 06/24/2021] cabergoline  0.5 mg Oral Once per day on Mon Wed Fri   enoxaparin (LOVENOX) injection  40 mg Subcutaneous Q24H   ezetimibe  10 mg Oral Daily   gabapentin  800 mg Oral TID   HYDROcodone-acetaminophen  1 tablet Oral QID   insulin aspart  0-15 Units Subcutaneous TID WC   insulin aspart  0-5 Units Subcutaneous QHS   insulin glargine-yfgn  35 Units Subcutaneous Q1200   ipratropium-albuterol  3 mL Nebulization Q6H   levothyroxine  137 mcg Oral QAC breakfast   methylPREDNISolone (SOLU-MEDROL) injection  40 mg Intravenous Q12H   mometasone-formoterol  2 puff Inhalation BID   pantoprazole  40 mg Oral Daily   venlafaxine XR  75 mg Oral Daily   Continuous Infusions:  Assessment & Plan:   Active Problems:   COPD exacerbation (HCC)   Acute respiratory failure with hypercapnia ([facility]   Hypothyroidism   65year old female with severe COPD on nighttime oxygen, followed by pulmonology, nicotine dependence, acromegaly on cabergoline, OSA pending sleep study, who presented to the ED in severe respiratory distress.  COPD exacerbation ([place])   Acute respiratory failure with hypercapnia - Patient on nighttime O2 presenting in acute respiratory distress requiring BiPAP, PCO2 69 - CTA chest negative for PE -8/9 we will continue IV steroids  Will add Z-Pak since she is coughing  Inhalers and duo nebs  PCCM Dr. Raul Del will see patient this afternoon   Nicotine dependence Patient was counseled on smoking cessation  Continue nicotine patch   OSA Patient awaiting sleep study  ?  Acromegaly - Continue carbo choline  Followed by endocrine    DVT prophylaxis: Lovenox Code Status: Full Family Communication: None at bedside Disposition Plan:  Status is: Inpatient  Remains inpatient appropriate because:Inpatient level of care appropriate due to severity of illness  Dispo: The patient is from: Home              Anticipated d/c is  to: Home              Patient currently is not medically stable to d/c.   Difficult to place patient No            LOS: 1 day   Time spent: 35 minutes with more than 50% on Antonito, MD [facility] Pager [phone]  If 7PM-7AM, please contact night-coverage 06/23/2021, 8:27 AM

## 2021-06-24 ENCOUNTER — Inpatient Hospital Stay: Payer: Medicare Other

## 2021-06-24 LAB — BASIC METABOLIC PANEL WITH GFR
Anion gap: 10 (ref 5–15)
BUN: 26 mg/dL — ABNORMAL HIGH (ref 8–23)
CO2: 32 mmol/L (ref 22–32)
Calcium: 9.5 mg/dL (ref 8.9–10.3)
Chloride: 99 mmol/L (ref 98–111)
Creatinine, Ser: 0.95 mg/dL (ref 0.44–1.00)
GFR, Estimated: >60 mL/min
Glucose, Bld: 158 mg/dL — ABNORMAL HIGH (ref 70–99)
Potassium: 4.6 mmol/L (ref 3.5–5.1)
Sodium: 141 mmol/L (ref 135–145)

## 2021-06-24 LAB — GLUCOSE, CAPILLARY
Glucose-Capillary: 101 mg/dL — ABNORMAL HIGH (ref 70–99)
Glucose-Capillary: 208 mg/dL — ABNORMAL HIGH (ref 70–99)
Glucose-Capillary: 267 mg/dL — ABNORMAL HIGH (ref 70–99)
Glucose-Capillary: 131 mg/dL — ABNORMAL HIGH (ref 70–99)

## 2021-06-24 MED ORDER — METHYLPREDNISOLONE SODIUM SUCC 125 MG IJ SOLR
60.0000 mg | Freq: Two times a day (BID) | INTRAMUSCULAR | Status: DC
  Administered 2021-06-24 – 2021-06-25 (×2): 60 mg via INTRAVENOUS
  Filled 2021-06-24 (×2): qty 2

## 2021-06-24 MED ORDER — INSULIN GLARGINE-YFGN 100 UNIT/ML ~~LOC~~ SOLN
40.0000 [IU] | Freq: Every day | SUBCUTANEOUS | Status: DC
  Filled 2021-06-24: qty 0.4

## 2021-06-24 MED ORDER — GUAIFENESIN ER 600 MG PO TB12
1200.0000 mg | ORAL_TABLET | Freq: Two times a day (BID) | ORAL | Status: DC
  Administered 2021-06-24 – 2021-06-25 (×3): 1200 mg via ORAL
  Filled 2021-06-24 (×3): qty 2

## 2021-06-24 NOTE — Progress Notes (Signed)
Radiology called the floor to be sure patient is to be NPO for [place] of RUQ.  Notified pt.

## 2021-06-24 NOTE — Progress Notes (Signed)
Patient is transferring to [id number].  Called and gave report to [employer].

## 2021-06-24 NOTE — Progress Notes (Signed)
Patient belongings and patient medications placed in bags and with patient.  Tele box removed; patient going to be transferred to [id number].  Patient alert and stable, no distress at this time.

## 2021-06-24 NOTE — Progress Notes (Signed)
PROGRESS NOTE  Alison Taylor  DOB: 05/23/1956  PCP: Philmore Pali, NP [id number]  DOA: 06/21/2021  LOS: 2 days  Hospital Day: 4   Chief Complaint  Patient presents with   Respiratory Distress   Brief narrative: Alison Taylor is a 65 y.o. female with PMH significant for severe COPD on nighttime oxygen, tobacco dependence, OSA pending sleep study, acromegaly on cabergoline, OSA pending sleep study. Patient presented to the ED on 8/7 with complaint of sudden onset of severe respiratory distress.    In the ED, patient was afebrile, blood pressure 86/58, she was brought on nonrebreather, transition to BiPAP in the ED  Venous blood gas with pH 7.25 and PCO2 69.   Other labs with WBC 12,000, D-dimer 1.1, troponin 28-25 CTA negative for PE but showed diffuse emphysematous changes. Admitted to hospitalist service. Pulmonary consultation was obtained  Subjective: Patient was seen and examined this morning.  Pleasant middle-aged Caucasian female.  Looks older for age. States that she still feels short of breath, but she is able to bring her phlegm up now.  Assessment/Plan: Acute exacerbation of COPD Acute respiratory failure with hypoxia and hypercapnia -Presented with shortness of shortness of breath -CTA chest negative for pulm embolism but showed diffuse emphysematous changes. -Currently she is on Augmentin, IV Solu-Medrol 40 mg twice daily. -With her complaint of persistent shortness of breath, I would increase Solu-Medrol to 60 mg daily. -Also add Mucinex.  Continue bronchodilators scheduled and as needed.  Type 2 diabetes mellitus -A1c 6.4 on 8/8 -Home meds include Lantus 35 units daily -Currently continued on Lantus 35 units daily.  With IV steroids, blood sugar level is running elevated, will increase it to 40 units from tomorrow. -Continue Neurontin for peripheral neuropathy Recent Labs  Lab 06/23/21 1114 06/23/21 1612 06/23/21 2109 06/24/21 0921 06/24/21 1158   GLUCAP 159* 248* 228* 101* 208*   Hypothyroidism -Continue Synthroid  Hyperlipidemia  -Continue Zetia  Depression -Continue Effexor  Current daily smoker -Counseled to quit.  Obstructive sleep apnea -Awaiting sleep study  Acromegaly -Continue cabergoline.  Follows up with endocrine as an outpatient.  Mobility: Encourage ambulation.  PT eval ordered. Code Status:   Code Status: Full Code  Nutritional status: Body mass index is 28.19 kg/m.     Diet:  Diet Order             Diet NPO time specified Except for: Sips with Meds  Diet effective midnight           Diet regular Room service appropriate? Yes; Fluid consistency: Thin  Diet effective now                  DVT prophylaxis:  enoxaparin (LOVENOX) injection 40 mg Start: 06/22/21 2200   Antimicrobials: Augmentin Fluid: None Consultants: Pulmonary Family Communication: None at bedside  Status is: Inpatient  Remains inpatient appropriate because: Continues to have shortness of breath.  Needs IV steroids  Dispo: The patient is from: Home              Anticipated d/c is to: Hopefully home, pending PT eval              Patient currently is not medically stable to d/c.   Difficult to place patient No     Infusions:    Scheduled Meds:  amoxicillin-clavulanate  1 tablet Oral Q12H   B-complex with vitamin C  1 tablet Oral Daily   cabergoline  0.5 mg Oral Once per day on  Mon Wed Fri   enoxaparin (LOVENOX) injection  40 mg Subcutaneous Q24H   ezetimibe  10 mg Oral Daily   gabapentin  800 mg Oral TID   guaiFENesin  1,200 mg Oral BID   HYDROcodone-acetaminophen  1 tablet Oral QID   insulin aspart  0-15 Units Subcutaneous TID WC   insulin aspart  0-5 Units Subcutaneous QHS   [START ON 06/25/2021] insulin glargine-yfgn  40 Units Subcutaneous Q1200   ipratropium-albuterol  3 mL Nebulization Q6H   levothyroxine  137 mcg Oral QAC breakfast   methylPREDNISolone (SOLU-MEDROL) injection  60 mg Intravenous Q12H    mometasone-formoterol  2 puff Inhalation BID   pantoprazole  40 mg Oral Daily   venlafaxine XR  75 mg Oral Daily    Antimicrobials: Anti-infectives (From admission, onward)    Start     Dose/Rate Route Frequency Ordered Stop   06/24/21 1000  azithromycin (ZITHROMAX) tablet 250 mg  Status:  Discontinued       See Hyperspace for full Linked Orders Report.   250 mg Oral Daily 06/23/21 0941 06/23/21 1733   06/23/21 2200  amoxicillin-clavulanate (AUGMENTIN) 875-125 MG per tablet 1 tablet        1 tablet Oral Every 12 hours 06/23/21 1733     06/23/21 1030  azithromycin (ZITHROMAX) tablet 500 mg       See Hyperspace for full Linked Orders Report.   500 mg Oral Daily 06/23/21 0941 06/23/21 1122       PRN meds: acetaminophen **OR** acetaminophen, albuterol, cyclobenzaprine, HYDROcodone-acetaminophen, ondansetron **OR** ondansetron (ZOFRAN) IV   Objective: Vitals:   06/24/21 0921 06/24/21 1200  BP: 140/79 (!) 146/77  Pulse: 82 77  Resp:  19  Temp: 97.8 F (36.6 C) 97.9 F (36.6 C)  SpO2: 94% 91%    Intake/Output Summary (Last 24 hours) at 06/24/2021 1309 Last data filed at 06/24/2021 1016 Gross per 24 hour  Intake 1200 ml  Output 1950 ml  Net -750 ml   Filed Weights   06/21/21 2151  Weight: 81.6 kg   Weight change:  Body mass index is 28.19 kg/m.   Physical Exam: General exam: Pleasant, middle-aged Caucasian female.  Looks older for her age Skin: No rashes, lesions or ulcers. HEENT: Atraumatic, normocephalic, no obvious bleeding Lungs: Mild scattered wheezing bilaterally, CVS: Regular rate and rhythm, no murmur GI/Abd soft, nontender, nondistended, bowel sound present CNS: Alert, awake oriented x3 Psychiatry: Mood appropriate Extremities: No pedal edema, no calf tenderness  Data Review: I have personally reviewed the laboratory data and studies available.  Recent Labs  Lab 06/21/21 2150 06/22/21 0623  WBC 12.1* 11.6*  HGB 14.5 12.6  HCT 43.0 37.6  MCV  91.1 90.0  PLT 344 276   Recent Labs  Lab 06/21/21 2150 06/22/21 0623 06/24/21 0612  NA 138  --  141  K 4.6  --  4.6  CL 100  --  99  CO2 28  --  32  GLUCOSE 197*  --  158*  BUN 13  --  26*  CREATININE 1.16* 1.05* 0.95  CALCIUM 9.3  --  9.5    F/u labs  Unresulted Labs (From admission, onward)     Start     Ordered   06/29/21 0500  Creatinine, serum  (enoxaparin (LOVENOX)    CrCl >/= 30 ml/min)  Weekly,   STAT     Comments: while on enoxaparin therapy    06/22/21 0431   06/25/21 0500  CBC with Differential/Platelet  Daily,  R     Question:  Specimen collection method  Answer:  Lab=Lab collect   06/24/21 1308   06/25/21 [id number]  Basic metabolic panel  Daily,   R     Question:  Specimen collection method  Answer:  Lab=Lab collect   06/24/21 1308   06/23/21 1732  Respiratory (~20 pathogens) panel by PCR  (Respiratory panel by PCR (~20 pathogens, ~24 hr TAT)  w precautions)  Once,   R        06/23/21 1732   06/23/21 1732  Strep pneumoniae urinary antigen  Once,   R        06/23/21 1732   06/23/21 1732  Legionella Pneumophila Serogp 1 Ur Ag  Once,   R        06/23/21 1732   06/23/21 1732  Fungitell, Serum  Once,   R       Question:  Specimen collection method  Answer:  Lab=Lab collect   06/23/21 1732            Signed, Terrilee Croak, MD [facility] 06/24/2021

## 2021-06-24 NOTE — Evaluation (Signed)
Physical Therapy Evaluation Patient Details Name: Alison Taylor MRN: [id number] DOB: 10-28-1956 Today's Date: 06/24/2021   History of Present Illness  Pt admitted for COPD exacerbation with complaints of unresponsiveness. HIstory includes COPD and nicotine dependence. Wears 2L of O2 PRN at baseline.  Clinical Impression  Pt is a pleasant 65 year old female who was admitted for COPD exacerbation. Pt demonstrates all bed mobility/transfers at baseline level. She reports independence at baseline. Pt does not require any further PT needs at this time. Pt will be dc in house and does not require follow up. RN aware. Will dc current orders.     Follow Up Recommendations No PT follow up    Equipment Recommendations  None recommended by PT    Recommendations for Other Services       Precautions / Restrictions Precautions Precautions: None Restrictions Weight Bearing Restrictions: No      Mobility  Bed Mobility Overal bed mobility: Independent             General bed mobility comments: ease of mobility. Independence noted    Transfers Overall transfer level: Independent Equipment used: None             General transfer comment: safe technique  Ambulation/Gait             General Gait Details: pt declined  Financial trader Rankin (Stroke Patients Only)       Balance Overall balance assessment: Independent                                           Pertinent Vitals/Pain Pain Assessment: No/denies pain    Home Living Family/patient expects to be discharged to:: Private residence Living Arrangements: Spouse/significant other Available Help at Discharge: Family Type of Home: House Home Access: Level entry     Home Layout: One level (1 step between rooms inside) Home Equipment: Clinical cytogeneticist - 4 wheels      Prior Function Level of Independence: Independent         Comments:  mainly limited by SOB symptoms. Reports independence with no falls     Hand Dominance        Extremity/Trunk Assessment   Upper Extremity Assessment Upper Extremity Assessment: Overall WFL for tasks assessed    Lower Extremity Assessment Lower Extremity Assessment: Overall WFL for tasks assessed       Communication   Communication: No difficulties  Cognition Arousal/Alertness: Awake/alert Behavior During Therapy: WFL for tasks assessed/performed Overall Cognitive Status: Within Functional Limits for tasks assessed                                 General Comments: anxious with mobility efforts      General Comments      Exercises Other Exercises Other Exercises: transfer on/off BSC to void. Safe technique with independence with hygiene. O2 at 89% on 3L with exertion   Assessment/Plan    PT Assessment Patent does not need any further PT services  PT Problem List         PT Treatment Interventions      PT Goals (Current goals can be found in the Care Plan section)  Acute Rehab PT Goals Patient Stated Goal: to breathe easier  PT Goal Formulation: All assessment and education complete, DC therapy Time For Goal Achievement: 06/24/21 Potential to Achieve Goals: Good    Frequency     Barriers to discharge        Co-evaluation               AM-PAC PT "6 Clicks" Mobility  Outcome Measure Help needed turning from your back to your side while in a flat bed without using bedrails?: None Help needed moving from lying on your back to sitting on the side of a flat bed without using bedrails?: None Help needed moving to and from a bed to a chair (including a wheelchair)?: None Help needed standing up from a chair using your arms (e.g., wheelchair or bedside chair)?: None Help needed to walk in hospital room?: None Help needed climbing 3-5 steps with a railing? : None 6 Click Score: 24    End of Session Equipment Utilized During Treatment:  Oxygen Activity Tolerance: Patient tolerated treatment well Patient left: in bed Nurse Communication: Mobility status PT Visit Diagnosis: Difficulty in walking, not elsewhere classified (R26.2)    Time: [id number] PT Time Calculation (min) (ACUTE ONLY): 20 min   Charges:   PT Evaluation $PT Eval Low Complexity: 1 Low          Greggory Stallion, PT, DPT [phone]   Jeanette Moffatt 06/24/2021, 4:27 PM

## 2021-06-25 DIAGNOSIS — E119 Type 2 diabetes mellitus without complications: Secondary | ICD-10-CM

## 2021-06-25 DIAGNOSIS — Z794 Long term (current) use of insulin: Secondary | ICD-10-CM

## 2021-06-25 LAB — CBC WITH DIFFERENTIAL/PLATELET
Abs Immature Granulocytes: 0.27 10*3/uL — ABNORMAL HIGH (ref 0.00–0.07)
Basophils Absolute: 0 10*3/uL (ref 0.0–0.1)
Basophils Relative: 0 %
Eosinophils Absolute: 0 10*3/uL (ref 0.0–0.5)
Eosinophils Relative: 0 %
HCT: 38.3 % (ref 36.0–46.0)
Hemoglobin: 12.7 g/dL (ref 12.0–15.0)
Immature Granulocytes: 2 %
Lymphocytes Relative: 5 %
Lymphs Abs: 0.6 10*3/uL — ABNORMAL LOW (ref 0.7–4.0)
MCH: 29.3 pg (ref 26.0–34.0)
MCHC: 33.2 g/dL (ref 30.0–36.0)
MCV: 88.2 fL (ref 80.0–100.0)
Monocytes Absolute: 0.3 10*3/uL (ref 0.1–1.0)
Monocytes Relative: 2 %
Neutro Abs: 12.8 10*3/uL — ABNORMAL HIGH (ref 1.7–7.7)
Neutrophils Relative %: 91 %
Platelets: 303 10*3/uL (ref 150–400)
RBC: 4.34 MIL/uL (ref 3.87–5.11)
RDW: 13.7 % (ref 11.5–15.5)
WBC: 14 10*3/uL — ABNORMAL HIGH (ref 4.0–10.5)
nRBC: 0 % (ref 0.0–0.2)

## 2021-06-25 LAB — BASIC METABOLIC PANEL
Anion gap: 6 (ref 5–15)
BUN: 30 mg/dL — ABNORMAL HIGH (ref 8–23)
CO2: 29 mmol/L (ref 22–32)
Calcium: 8.9 mg/dL (ref 8.9–10.3)
Chloride: 103 mmol/L (ref 98–111)
Creatinine, Ser: 0.88 mg/dL (ref 0.44–1.00)
GFR, Estimated: >60 mL/min (ref >60)
Glucose, Bld: 189 mg/dL — ABNORMAL HIGH (ref 70–99)
Potassium: 5.1 mmol/L (ref 3.5–5.1)
Sodium: 138 mmol/L (ref 135–145)

## 2021-06-25 LAB — GLUCOSE, CAPILLARY
Glucose-Capillary: 137 mg/dL — ABNORMAL HIGH (ref 70–99)
Glucose-Capillary: 116 mg/dL — ABNORMAL HIGH (ref 70–99)

## 2021-06-25 MED ORDER — GUAIFENESIN ER 600 MG PO TB12: 600.0000 mg | ORAL_TABLET | Freq: Two times a day (BID) | ORAL | 0 refills | Status: AC

## 2021-06-25 MED ORDER — AMOXICILLIN-POT CLAVULANATE 875-125 MG PO TABS: 1.0000 | ORAL_TABLET | Freq: Two times a day (BID) | ORAL | 0 refills | Status: AC

## 2021-06-25 MED ORDER — SACCHAROMYCES BOULARDII 250 MG PO CAPS: 250.0000 mg | ORAL_CAPSULE | Freq: Two times a day (BID) | ORAL | 0 refills | Status: AC

## 2021-06-25 MED ORDER — PREDNISONE 10 MG PO TABS: 10.0000 mg | ORAL_TABLET | Freq: Every day | ORAL | Status: DC

## 2021-06-25 MED ORDER — PREDNISONE 10 MG PO TABS: ORAL_TABLET | ORAL | 0 refills | Status: DC

## 2021-06-25 NOTE — Care Management Important Message (Signed)
Important Message  Patient Details  Name: Alison Taylor MRN: [id number] Date of Birth: 02/14/1956   Medicare Important Message Given:  Yes     [name] [name] 06/25/2021, 11:06 AM

## 2021-06-25 NOTE — Discharge Summary (Signed)
Physician Discharge Summary  Alison Taylor [id number] DOB: 1956-02-12 DOA: 06/21/2021  PCP: Philmore Pali, NP  Admit date: 06/21/2021 Discharge date: 06/25/2021  Admitted From: Home Discharge disposition: Home   Code Status: Full Code   Discharge Diagnosis:   Active Problems:   COPD exacerbation ([place])   Acute respiratory failure with hypercapnia ([place])   Hypothyroidism   Type 2 diabetes mellitus without complication, with long-term current use of insulin [facility])     Chief Complaint  Patient presents with   Respiratory Distress   Brief narrative: Alison Taylor is a 65 y.o. female with PMH significant for severe COPD on nighttime oxygen, tobacco dependence, OSA pending sleep study, acromegaly on cabergoline, OSA pending sleep study. Patient presented to the ED on 8/7 with complaint of sudden onset of severe respiratory distress.    In the ED, patient was afebrile, blood pressure 86/58, she was brought on nonrebreather, transition to BiPAP in the ED  Venous blood gas with pH 7.25 and PCO2 69.   Other labs with WBC 12,000, D-dimer 1.1, troponin 28-25 CTA negative for PE but showed diffuse emphysematous changes. Admitted to hospitalist service. Pulmonary consultation was obtained.  Subjective: Patient was seen and examined this morning.  Pleasant middle-aged Caucasian female.  Looks older for age. Reports improving shortness of breath, bringing up green phlegm now.  No wheezing.  Hospital course: Acute exacerbation of COPD Acute respiratory failure with hypoxia and hypercapnia -Presented with shortness of shortness of breath -CTA chest negative for pulm embolism but showed diffuse emphysematous changes. -Currently she is on Augmentin, IV Solu-Medrol 60 mg twice daily, Mucinex twice daily -Clinically improving.  No wheezing on auscultation. -We will discharge her home on tapering course of oral steroids, 5 more days of Augmentin.  -Continue Mucinex and  bronchodilators. -Strongly counseled to quit smoking  Type 2 diabetes mellitus -A1c 6.4 on 8/8 -Home meds include Lantus 35 units daily -Currently on steroids so she is requiring increased dose of Lantus at 40 units for blood sugar control.   -Continue Neurontin for peripheral neuropathy Recent Labs  Lab 06/24/21 0921 06/24/21 1158 06/24/21 1558 06/24/21 2158 06/25/21 0805  GLUCAP 101* 208* 267* 131* 137*  Hypothyroidism -Continue Synthroid  Hyperlipidemia  -Continue Zetia  Depression -Continue Effexor  Current daily smoker -Counseled to quit.  Obstructive sleep apnea -Awaiting sleep study  Acromegaly -Continue cabergoline.  Follows up with endocrine as an outpatient.  Okay to discharge home.  Physical therapy evaluation was obtained.  No outpatient follow-up required.   Allergies as of 06/25/2021       Reactions   Iodine Hives   Has to have prep to use   Keflex [cephalexin] Hives   Ivp Dye [iodinated Diagnostic Agents] Other (See Comments)   Reaction:  Redness    Sulfa Antibiotics Anxiety   Tylenol With Codeine #3 [acetaminophen-codeine] Palpitations        Medication List     STOP taking these medications    cholecalciferol 1000 units tablet Commonly known as: VITAMIN D       TAKE these medications    albuterol 108 (90 Base) MCG/ACT inhaler Commonly known as: VENTOLIN HFA Inhale 1-2 puffs into the lungs every 6 (six) hours as needed for wheezing or shortness of breath.   amoxicillin-clavulanate 875-125 MG tablet Commonly known as: AUGMENTIN Take 1 tablet by mouth every 12 (twelve) hours for 5 days.   Atrovent HFA 17 MCG/ACT inhaler Generic drug: ipratropium Inhale 2 puffs into the lungs in  the morning, at noon, in the evening, and at bedtime.   b complex vitamins tablet Take 1 tablet by mouth daily.   Breztri Aerosphere 160-9-4.8 MCG/ACT Aero Generic drug: Budeson-Glycopyrrol-Formoterol Inhale 2 puffs into the lungs 2 (two) times  daily.   cabergoline 0.5 MG tablet Commonly known as: DOSTINEX Take 0.5 mg by mouth 3 (three) times a week.   cyclobenzaprine 10 MG tablet Commonly known as: FLEXERIL Take 10 mg by mouth 3 (three) times daily as needed for muscle spasms.   ezetimibe 10 MG tablet Commonly known as: ZETIA Take 10 mg by mouth daily.   gabapentin 800 MG tablet Commonly known as: NEURONTIN Take 800 mg by mouth 3 (three) times daily.   guaiFENesin 600 MG 12 hr tablet Commonly known as: MUCINEX Take 1 tablet (600 mg total) by mouth 2 (two) times daily.   HYDROcodone-acetaminophen 7.5-325 MG tablet Commonly known as: NORCO Take 1 tablet by mouth 4 (four) times daily.   Lantus SoloStar 100 UNIT/ML Solostar Pen Generic drug: insulin glargine Inject 35 Units into the skin daily at 12 noon.   levothyroxine 137 MCG tablet Commonly known as: SYNTHROID Take 137 mcg by mouth daily before breakfast. What changed: Another medication with the same name was removed. Continue taking this medication, and follow the directions you see here.   OCUVITE EYE HEATLH GUMMIES PO Take 2 each by mouth daily.   omeprazole 20 MG capsule Commonly known as: PRILOSEC Take 20 mg by mouth daily as needed (for acid reflux).   predniSONE 5 MG tablet Commonly known as: DELTASONE Take 5 mg by mouth daily. What changed:  Another medication with the same name was added. Make sure you understand how and when to take each. Another medication with the same name was changed. Make sure you understand how and when to take each.   predniSONE 10 MG tablet Commonly known as: DELTASONE Take 6 tabs twice a day for 2 days, then take 4 tabs twice a day for 2 days, then take 4 tabs daily for 2 days, then take 3 tabs daily for 2 days, then take 2 tabs daily for 2 days, then take 1 tab daily as you are doing before. What changed: You were already taking a medication with the same name, and this prescription was added. Make sure you  understand how and when to take each.   predniSONE 10 MG tablet Commonly known as: DELTASONE Take 1 tablet (10 mg total) by mouth daily. Start taking on: July 05, 2021 What changed: These instructions start on July 05, 2021. If you are unsure what to do until then, ask your doctor or other care provider.   Premarin vaginal cream Generic drug: conjugated estrogens Place 0.5 g vaginally 2 (two) times a week.   saccharomyces boulardii 250 MG capsule Commonly known as: FLORASTOR Take 1 capsule (250 mg total) by mouth 2 (two) times daily for 5 days.   venlafaxine XR 75 MG 24 hr capsule Commonly known as: EFFEXOR-XR Take 75 mg by mouth daily.   Vitamin D (Ergocalciferol) 1.25 MG (50000 UNIT) Caps capsule Commonly known as: DRISDOL Take 50,000 Units by mouth 2 (two) times a week.        Discharge Instructions:  Diet Recommendation: Cardiac/diabetic diet   Follow with Primary MD Philmore Pali, NP in 7 days   Get CBC/BMP checked in next visit within 1 week by PCP or SNF MD ( we routinely change or add medications that can affect your baseline labs  and fluid status, therefore we recommend that you get the mentioned basic workup next visit with your PCP, your PCP may decide not to get them or add new tests based on their clinical decision)  On your next visit with your PCP, please Get Medicines reviewed and adjusted.  Please request your PCP  to go over all Hospital Tests and Procedure/Radiological results at the follow up, please get all Hospital records sent to your Prim MD by signing hospital release before you go home.  Activity: As tolerated with Full fall precautions use walker/cane & assistance as needed  For Heart failure patients - Check your Weight same time everyday, if you gain over 2 pounds, or you develop in leg swelling, experience more shortness of breath or chest pain, call your Primary MD immediately. Follow Cardiac Low Salt Diet and 1.5 lit/day fluid  restriction.  If you have smoked or chewed Tobacco in the last 2 yrs please stop smoking, stop any regular Alcohol  and or any Recreational drug use.  If you experience worsening of your admission symptoms, develop shortness of breath, life threatening emergency, suicidal or homicidal thoughts you must seek medical attention immediately by calling 911 or calling your MD immediately  if symptoms less severe.  You Must read complete instructions/literature along with all the possible adverse reactions/side effects for all the Medicines you take and that have been prescribed to you. Take any new Medicines after you have completely understood and accpet all the possible adverse reactions/side effects.   Do not drive, operate heavy machinery, perform activities at heights, swimming or participation in water activities or provide baby sitting services if your were admitted for syncope or siezures until you have seen by Primary MD or a Neurologist and advised to do so again.  Do not drive when taking Pain medications.  Do not take more than prescribed Pain, Sleep and Anxiety Medications  Wear Seat belts while driving.   Please note You were cared for by a hospitalist during your hospital stay. If you have any questions about your discharge medications or the care you received while you were in the hospital after you are discharged, you can call the unit and asked to speak with the hospitalist on call if the hospitalist that took care of you is not available. Once you are discharged, your primary care physician will handle any further medical issues. Please note that NO REFILLS for any discharge medications will be authorized once you are discharged, as it is imperative that you return to your primary care physician (or establish a relationship with a primary care physician if you do not have one) for your aftercare needs so that they can reassess your need for medications and monitor your lab  values.    Follow ups:    Follow-up Information     Philmore Pali, NP Follow up.   Specialty: Nurse Practitioner Contact information: [place] [address] [phone]                 Wound care:     Discharge Exam:   Vitals:   06/24/21 1946 06/24/21 2304 06/25/21 0522 06/25/21 0748  BP: (!) 145/67 (!) 148/63 (!) 122/57 (!) 131/55  Pulse: 70 61 65 60  Resp: 18 (!) '21 17 16  '$ Temp: 98.1 F (36.7 C) 97.7 F (36.5 C) 97.7 F (36.5 C) 97.9 F (36.6 C)  TempSrc:      SpO2: 94% 97% 96% 96%  Weight:  Height:        Body mass index is 28.19 kg/m.  General exam: Pleasant, middle-aged female.  Looks older for her age Skin: No rashes, lesions or ulcers. HEENT: Atraumatic, normocephalic, no obvious bleeding Lungs: Clear to auscultation bilaterally.  Diminished air entry in both bases.  No wheezing, crackles. CVS: Regular rate and rhythm, no murmur GI/Abd soft, nontender, nondistended, bowel sound present CNS: Alert, awake, oriented x3 Psychiatry: Mood appropriate Extremities: No pedal edema, no calf tenderness  Time coordinating discharge: 35 minutes   The results of significant diagnostics from this hospitalization (including imaging, microbiology, ancillary and laboratory) are listed below for reference.    Procedures and Diagnostic Studies:   CT HEAD WO CONTRAST (5MM)  Result Date: 06/21/2021 CLINICAL DATA:  Altered mental status EXAM: CT HEAD WITHOUT CONTRAST TECHNIQUE: Contiguous axial images were obtained from the base of the skull through the vertex without intravenous contrast. COMPARISON:  None. FINDINGS: Brain: Old left basal ganglia lacunar infarct. No acute intracranial abnormality. Specifically, no hemorrhage, hydrocephalus, mass lesion, acute infarction, or significant intracranial injury. Vascular: No hyperdense vessel or unexpected calcification. Skull: No acute calvarial abnormality. Sinuses/Orbits: No acute findings Other: None  IMPRESSION: Old left basal ganglia lacunar infarct. No acute intracranial abnormality. Electronically Signed   By: Rolm Baptise M.D.   On: 06/21/2021 23:13   CT Angio Chest PE W and/or Wo Contrast  Result Date: 06/22/2021 CLINICAL DATA:  Positive D-dimer EXAM: CT ANGIOGRAPHY CHEST WITH CONTRAST TECHNIQUE: Multidetector CT imaging of the chest was performed using the standard protocol during bolus administration of intravenous contrast. Multiplanar CT image reconstructions and MIPs were obtained to evaluate the vascular anatomy. CONTRAST:  92m OMNIPAQUE IOHEXOL 350 MG/ML SOLN COMPARISON:  Chest x-ray from earlier in the same day, CT from 06/14/2019 FINDINGS: Cardiovascular: Thoracic aorta demonstrates atherosclerotic calcifications without aneurysmal dilatation or dissection. Irregular soft atherosclerotic plaque is noted in the descending thoracic aorta. The pulmonary artery demonstrates a normal branching pattern bilaterally. No focal filling defect to suggest pulmonary embolism is noted. Mediastinum/Nodes: Thoracic inlet is within normal limits. No sizable hilar or mediastinal adenopathy is noted. The esophagus as visualized is within normal limits. Lungs/Pleura: Diffuse emphysematous changes are identified. The lungs are well aerated bilaterally. No focal infiltrate or sizable effusion is seen. Mild dependent atelectatic changes in the right base are noted. Upper Abdomen: Visualized upper abdomen shows no acute abnormality. Musculoskeletal: Degenerative changes of the thoracic spine are noted. No acute rib abnormality is noted. Review of the MIP images confirms the above findings. IMPRESSION: No evidence of pulmonary emboli. Mild dependent right basilar atelectasis. Aortic Atherosclerosis (ICD10-I70.0) and Emphysema (ICD10-J43.9). Electronically Signed   By: MInez CatalinaM.D.   On: 06/22/2021 03:43   DG Chest Portable 1 View  Result Date: 06/21/2021 CLINICAL DATA:  Respiratory distress EXAM: PORTABLE CHEST  1 VIEW COMPARISON:  01/17/2021 FINDINGS: Cardiac shadow is within normal limits. Aortic calcifications are again seen. Lungs are well aerated bilaterally. No focal infiltrate or sizable effusion is seen. Mild interstitial changes are noted of a chronic nature. IMPRESSION: No active disease. Electronically Signed   By: MInez CatalinaM.D.   On: 06/21/2021 22:07     Labs:   Basic Metabolic Panel: Recent Labs  Lab 06/21/21 2150 06/22/21 0623 06/24/21 0612 06/25/21 0424  NA 138  --  141 138  K 4.6  --  4.6 5.1  CL 100  --  99 103  CO2 28  --  32 29  GLUCOSE 197*  --  158* 189*  BUN 13  --  26* 30*  CREATININE 1.16* 1.05* 0.95 0.88  CALCIUM 9.3  --  9.5 8.9   GFR Estimated Creatinine Clearance: 71.9 mL/min (by C-G formula based on SCr of 0.88 mg/dL). Liver Function Tests: Recent Labs  Lab 06/21/21 2150  AST 46*  ALT 49*  ALKPHOS 114  BILITOT 0.7  PROT 7.5  ALBUMIN 4.4   No results for input(s): LIPASE, AMYLASE in the last 168 hours. No results for input(s): AMMONIA in the last 168 hours. Coagulation profile Recent Labs  Lab 06/21/21 2321  INR 1.0    CBC: Recent Labs  Lab 06/21/21 2150 06/22/21 0623 06/25/21 0424  WBC 12.1* 11.6* 14.0*  NEUTROABS  --   --  12.8*  HGB 14.5 12.6 12.7  HCT 43.0 37.6 38.3  MCV 91.1 90.0 88.2  PLT 344 276 303   Cardiac Enzymes: No results for input(s): CKTOTAL, CKMB, CKMBINDEX, TROPONINI in the last 168 hours. BNP: Invalid input(s): POCBNP CBG: Recent Labs  Lab 06/24/21 0921 06/24/21 1158 06/24/21 1558 06/24/21 2158 06/25/21 0805  GLUCAP 101* 208* 267* 131* 137*   D-Dimer No results for input(s): DDIMER in the last 72 hours. Hgb A1c No results for input(s): HGBA1C in the last 72 hours. Lipid Profile No results for input(s): CHOL, HDL, LDLCALC, TRIG, CHOLHDL, LDLDIRECT in the last 72 hours. Thyroid function studies No results for input(s): TSH, T4TOTAL, T3FREE, THYROIDAB in the last 72 hours.  Invalid input(s):  FREET3 Anemia work up No results for input(s): VITAMINB12, FOLATE, FERRITIN, TIBC, IRON, RETICCTPCT in the last 72 hours. Microbiology Recent Results (from the past 240 hour(s))  Resp Panel by RT-PCR (Flu A&B, Covid) Nasopharyngeal Swab     Status: None   Collection Time: 06/22/21  9:46 AM   Specimen: Nasopharyngeal Swab; Nasopharyngeal(NP) swabs in vial transport medium  Result Value Ref Range Status   SARS Coronavirus 2 by RT PCR NEGATIVE NEGATIVE Final    Comment: (NOTE) SARS-CoV-2 target nucleic acids are NOT DETECTED.  The SARS-CoV-2 RNA is generally detectable in upper respiratory specimens during the acute phase of infection. The lowest concentration of SARS-CoV-2 viral copies this assay can detect is 138 copies/mL. A negative result does not preclude SARS-Cov-2 infection and should not be used as the sole basis for treatment or other patient management decisions. A negative result may occur with  improper specimen collection/handling, submission of specimen other than nasopharyngeal swab, presence of viral mutation(s) within the areas targeted by this assay, and inadequate number of viral copies(<138 copies/mL). A negative result must be combined with clinical observations, patient history, and epidemiological information. The expected result is Negative.  Fact Sheet for Patients:  EntrepreneurPulse.com.au  Fact Sheet for Healthcare Providers:  IncredibleEmployment.be  This test is no t yet approved or cleared by the [place] FDA and  has been authorized for detection and/or diagnosis of SARS-CoV-2 by FDA under an Emergency Use Authorization (EUA). This EUA will remain  in effect (meaning this test can be used) for the duration of the COVID-19 declaration under Section 564(b)(1) of the Act, 21 U.S.C.section 360bbb-3(b)(1), unless the authorization is terminated  or revoked sooner.       Influenza A by PCR NEGATIVE NEGATIVE  Final   Influenza B by PCR NEGATIVE NEGATIVE Final    Comment: (NOTE) The Xpert Xpress SARS-CoV-2/FLU/RSV plus assay is intended as an aid in the diagnosis of influenza from Nasopharyngeal swab specimens and should not be used as a sole basis for  treatment. Nasal washings and aspirates are unacceptable for Xpert Xpress SARS-CoV-2/FLU/RSV testing.  Fact Sheet for Patients: EntrepreneurPulse.com.au  Fact Sheet for Healthcare Providers: IncredibleEmployment.be  This test is not yet approved or cleared by the [place] FDA and has been authorized for detection and/or diagnosis of SARS-CoV-2 by FDA under an Emergency Use Authorization (EUA). This EUA will remain in effect (meaning this test can be used) for the duration of the COVID-19 declaration under Section 564(b)(1) of the Act, 21 U.S.C. section 360bbb-3(b)(1), unless the authorization is terminated or revoked.  Performed at [facility], [address] [place], [place] [address]      Signed: Terrilee Croak  [facility] 06/25/2021, 10:23 AM

## 2021-06-25 NOTE — Progress Notes (Signed)
Inpatient Diabetes Program Recommendations  AACE/ADA: New Consensus Statement on Inpatient Glycemic Control (2015)  Target Ranges:  Prepandial:   less than 140 mg/dL      Peak postprandial:   less than 180 mg/dL (1-2 hours)      Critically ill patients:  140 - 180 mg/dL   Lab Results  Component Value Date   GLUCAP 137 (H) 06/25/2021   HGBA1C 6.4 (H) 06/22/2021    Review of Glycemic Control Results for AIMEN, LEEB (MRN [id number]) as of 06/25/2021 09:07  Ref. Range 06/24/2021 09:21 06/24/2021 11:58 06/24/2021 15:58 06/24/2021 21:58 06/25/2021 08:05  Glucose-Capillary Latest Ref Range: 70 - 99 mg/dL 101 (H) 208 (H) 267 (H) 131 (H) 137 (H)   Diabetes history: DM 2 Outpatient Diabetes medications: Lantus 35 units Current orders for Inpatient glycemic control:  Semglee 40 units Novolog 0-15 units tid + hs  Solumedrol 60 mg Q12 hours  Inpatient Diabetes Program Recommendations:    Glucose trends increase after meal intake/steroid dose  -  Add Novolog 4 units tid if eating >50% of meals  Thanks,  Tama Headings RN, MSN, BC-ADM Inpatient Diabetes Coordinator Team Pager [phone] (8a-5p)

## 2021-06-26 ENCOUNTER — Other Ambulatory Visit: Payer: Self-pay

## 2021-06-26 DIAGNOSIS — J441 Chronic obstructive pulmonary disease with (acute) exacerbation: Secondary | ICD-10-CM

## 2021-06-26 LAB — FUNGITELL, SERUM

## 2021-06-29 DIAGNOSIS — J449 Chronic obstructive pulmonary disease, unspecified: Secondary | ICD-10-CM | POA: Diagnosis not present

## 2021-06-30 DIAGNOSIS — G894 Chronic pain syndrome: Secondary | ICD-10-CM | POA: Diagnosis not present

## 2021-06-30 DIAGNOSIS — M47816 Spondylosis without myelopathy or radiculopathy, lumbar region: Secondary | ICD-10-CM | POA: Diagnosis not present

## 2021-06-30 DIAGNOSIS — M545 Low back pain, unspecified: Secondary | ICD-10-CM | POA: Diagnosis not present

## 2021-06-30 DIAGNOSIS — M461 Sacroiliitis, not elsewhere classified: Secondary | ICD-10-CM | POA: Diagnosis not present

## 2021-06-30 DIAGNOSIS — M549 Dorsalgia, unspecified: Secondary | ICD-10-CM | POA: Diagnosis not present

## 2021-06-30 DIAGNOSIS — Z1389 Encounter for screening for other disorder: Secondary | ICD-10-CM | POA: Diagnosis not present

## 2021-07-01 ENCOUNTER — Other Ambulatory Visit: Payer: Self-pay | Admitting: *Deleted

## 2021-07-01 ENCOUNTER — Encounter: Payer: Self-pay | Admitting: *Deleted

## 2021-07-01 NOTE — Patient Outreach (Signed)
[facility]) Care Management  07/01/2021  Alison Taylor December 20, 1955 [id number]   [facility] outreach to Post hospitalized patient Alison Taylor was referred to [facility] on 06/29/21 for post hospital services after admission with diagnosis, Chronic obstructive pulmonary disease (COPD)   [place] care [facility])   Alison Taylor is able to verify HIPAA identifiers   Initial Assessment  Alison Taylor she is doing better   Diabetes  Managed well at home last HgA1c = 6.4 Cbg values 82-100s Hypoglycemia symptoms at 82 but managed with food intake   Scheduled to see primary care provider (PCP) on 07/02/21 at 3 pm   Patient Active Problem List   Diagnosis Date Noted   Type 2 diabetes mellitus without complication, with long-term current use of insulin ([place]) 06/25/2021   Acute respiratory failure with hypercapnia ([place]) 06/22/2021   Hypothyroidism 06/22/2021   COPD exacerbation ([place]) 12/10/2015    Current Outpatient Medications on File Prior to Visit  Medication Sig Dispense Refill   Blood Glucose Monitoring Suppl (ONE TOUCH ULTRA 2) w/Device KIT SMARTSIG:1 Kit(s) Via Meter Every Morning     LANTUS SOLOSTAR 100 UNIT/ML Solostar Pen Inject 35 Units into the skin daily at 12 noon.     ONETOUCH ULTRA test strip USE TO CHECK BLOOD SUGAR 3 TIMES A DAY     OXYGEN Inhale into the lungs.     ULTICARE MINI PEN NEEDLES 31G X 6 MM MISC USE 5 TIMES A DAY AS DIRECTED     albuterol (VENTOLIN HFA) 108 (90 Base) MCG/ACT inhaler Inhale 1-2 puffs into the lungs every 6 (six) hours as needed for wheezing or shortness of breath.     ATROVENT HFA 17 MCG/ACT inhaler Inhale 2 puffs into the lungs in the morning, at noon, in the evening, and at bedtime.     b complex vitamins tablet Take 1 tablet by mouth daily.     BREZTRI AEROSPHERE 160-9-4.8 MCG/ACT AERO Inhale 2 puffs into the lungs 2 (two) times daily.     cabergoline (DOSTINEX) 0.5 MG tablet Take 0.5 mg by mouth 3 (three) times a week.      cyclobenzaprine (FLEXERIL) 10 MG tablet Take 10 mg by mouth 3 (three) times daily as needed for muscle spasms.     ezetimibe (ZETIA) 10 MG tablet Take 10 mg by mouth daily.     gabapentin (NEURONTIN) 800 MG tablet Take 800 mg by mouth 3 (three) times daily.     guaiFENesin (MUCINEX) 600 MG 12 hr tablet Take 1 tablet (600 mg total) by mouth 2 (two) times daily. 60 tablet 0   HYDROcodone-acetaminophen (NORCO) 7.5-325 MG tablet Take 1 tablet by mouth 4 (four) times daily.     levothyroxine (SYNTHROID) 137 MCG tablet Take 137 mcg by mouth daily before breakfast.     Multiple Vitamins-Minerals (OCUVITE EYE HEATLH GUMMIES PO) Take 2 each by mouth daily. (Patient not taking: Reported on 06/22/2021)     omeprazole (PRILOSEC) 20 MG capsule Take 20 mg by mouth daily as needed (for acid reflux).     predniSONE (DELTASONE) 10 MG tablet Take 6 tabs twice a day for 2 days, then take 4 tabs twice a day for 2 days, then take 4 tabs daily for 2 days, then take 3 tabs daily for 2 days, then take 2 tabs daily for 2 days, then take 1 tab daily as you are doing before. 60 tablet 0   [START ON 07/05/2021] predniSONE (DELTASONE) 10 MG tablet  Take 1 tablet (10 mg total) by mouth daily.     predniSONE (DELTASONE) 5 MG tablet Take 5 mg by mouth daily.     PREMARIN vaginal cream Place 0.5 g vaginally 2 (two) times a week.     venlafaxine XR (EFFEXOR-XR) 75 MG 24 hr capsule Take 75 mg by mouth daily.     Vitamin D, Ergocalciferol, (DRISDOL) 1.25 MG (50000 UNIT) CAPS capsule Take 50,000 Units by mouth 2 (two) times a week.     No current facility-administered medications on file prior to visit.    Plan Patient agrees to care plan and follow up within the next 14-21 business days   Goals Addressed               This Visit's Progress     Patient Stated     Track and manage my symptoms [facility]) (pt-stated)   On track     Timeframe:  Long-Range Goal Priority:  High Start Date:                    07/01/21          Expected End Date:          10/14/21             Follow Up Date 07/16/21    - eliminate symptom triggers at home - follow rescue plan if symptoms flare-up - keep follow-up appointments      Notes: 07/01/21 No audible wheezing noted. Continues ordered medicines and oxygen  She reports her appetite is fair        Makira Holleman L. Lavina Hamman, RN, BSN, [place] Coordinator Office number [phone] [facility] number [phone] Fax number [phone]

## 2021-07-02 DIAGNOSIS — E11618 Type 2 diabetes mellitus with other diabetic arthropathy: Secondary | ICD-10-CM | POA: Diagnosis not present

## 2021-07-02 DIAGNOSIS — Z79899 Other long term (current) drug therapy: Secondary | ICD-10-CM | POA: Diagnosis not present

## 2021-07-02 DIAGNOSIS — E039 Hypothyroidism, unspecified: Secondary | ICD-10-CM | POA: Diagnosis not present

## 2021-07-02 DIAGNOSIS — J9612 Chronic respiratory failure with hypercapnia: Secondary | ICD-10-CM | POA: Diagnosis not present

## 2021-07-02 DIAGNOSIS — J441 Chronic obstructive pulmonary disease with (acute) exacerbation: Secondary | ICD-10-CM | POA: Diagnosis not present

## 2021-07-16 ENCOUNTER — Other Ambulatory Visit: Payer: Self-pay | Admitting: *Deleted

## 2021-07-16 ENCOUNTER — Encounter: Payer: Self-pay | Admitting: *Deleted

## 2021-07-16 ENCOUNTER — Other Ambulatory Visit: Payer: Self-pay

## 2021-07-16 NOTE — Patient Outreach (Signed)
[facility]) Care Management  07/16/2021  Alison Taylor 08-May-1956 [id number]   [facility] outreach to Post hospitalized patient Alison Taylor was referred to [facility] on 06/29/21 for post hospital services after admission with diagnosis, Chronic obstructive pulmonary disease (COPD)    Insurance  [place] [facility] care [facility])   She is able to verify HIPAA identifiers   Follow up assessment She reports she continues to get better  She reports today she is doing better related to respiratory symptoms but is having left leg/hip pain and is pending an injection to relieve the pain   Chronic obstructive pulmonary disease (COPD)  No audible wheezing noted during outreach  She reports she was prescribed more prednisone  She reports possible home triggers she is working on are use of Clorox and her pets She has 2 dogs one is a mix of pit bull and the other is a beagle  She uses an allergen air filter    Hip pain Pending injection to her /hip in September 2022 for arthritis in pelvic area Left leg hip pain is worse than her right hip  Hurts when she walk or stand, not when she is lying down  social determinants of health (SDOH) Has food after she went to the swap shop 07/15/21 Educated her on her  [place] care [facility]) post hospital meal and transportation benefits  She is aware and uses her UHC over the counter (OTC) Benefit  She voiced appreciation of the resources  Patient Active Problem List   Diagnosis Date Noted   Tobacco use disorder 03/19/2020   Pyogenic arthritis of right hip ([place]) 10/09/2019   Chronic respiratory failure with hypoxia ([place]) 10/07/2019   Pyelonephritis 10/07/2019   Severe right groin pain 10/07/2019   Glucosuria with normal serum glucose 12/09/2017   Acute kidney injury ([place]) 99991111   Complicated UTI (urinary tract infection) 04/27/2017   History of infection due to drug-resistant organism 04/27/2017   Type 2 diabetes mellitus without  complication, with long-term current use of insulin ([place]) 03/30/2017   Acute respiratory failure with hypoxia and hypercapnia ([place]) 02/27/2017   Newly diagnosed diabetes ([place]) 12/06/2016   Acute cystitis 07/04/2016   Acromegaly and pituitary gigantism ([place]) 05/17/2016   Polyarthralgia 05/17/2016   Numbness and tingling in both hands 05/17/2016   Nausea 05/31/2014   Hypertriglyceridemia 03/27/2013   Pituitary adenoma ([place]) 03/27/2013   Deficiency anemia 03/20/2013   Spinal stenosis 03/20/2013   Thyroid disease 02/15/2013   Allergic rhinitis 02/15/2013   GERD (gastroesophageal reflux disease) 02/15/2013   Palpitations 02/15/2013   Chronic obstructive pulmonary disease with acute exacerbation ([place]) 01/10/2013   Contusion of right foot 10/30/2012   Right foot pain 10/30/2012    Plans Patient agrees to care plan and follow up within the next 30 business days   Goals Addressed               This Visit's Progress     Patient Stated     Find Help in My Community [facility]) (pt-stated)   On track     Timeframe:  Short-Term Goal Priority:  Medium Start Date:        07/16/21                     Expected End Date:      08/14/21                 Follow Up Date 07/30/21  Barriers: Knowledge    -  follow-up on any referrals for help I am given - make a list of family or friends that I can call     Notes:  07/16/21 to follow up on [facility] benefits for meal and transportation using her customer service number on insurance card       Track and manage my symptoms (COPD-THN) (pt-stated)   On track     Timeframe:  Long-Range Goal Priority:  High Start Date:                    07/01/21         Expected End Date:          10/14/21             Follow Up Date 07/16/21  Barriers: Knowledge    - eliminate symptom triggers at home - follow rescue plan if symptoms flare-up - keep follow-up appointments     Notes:  07/16/21 reviewed possible home triggers to include Clorox, pets- uses allergen air  filters, attending pulmonary appointments recent prednisone provided 07/01/21 No audible wheezing noted. Continues ordered medicines and oxygen  She reports her appetite is fair        Anoop Hemmer L. Lavina Hamman, RN, BSN, [place] Coordinator Office number [phone] Main [facility] number [phone] Fax number [phone]

## 2021-07-17 DIAGNOSIS — J449 Chronic obstructive pulmonary disease, unspecified: Secondary | ICD-10-CM | POA: Diagnosis not present

## 2021-07-19 DIAGNOSIS — J449 Chronic obstructive pulmonary disease, unspecified: Secondary | ICD-10-CM | POA: Diagnosis not present

## 2021-07-19 DIAGNOSIS — J439 Emphysema, unspecified: Secondary | ICD-10-CM | POA: Diagnosis not present

## 2021-07-19 DIAGNOSIS — J9600 Acute respiratory failure, unspecified whether with hypoxia or hypercapnia: Secondary | ICD-10-CM | POA: Diagnosis not present

## 2021-07-22 DIAGNOSIS — J449 Chronic obstructive pulmonary disease, unspecified: Secondary | ICD-10-CM | POA: Diagnosis not present

## 2021-07-22 DIAGNOSIS — M461 Sacroiliitis, not elsewhere classified: Secondary | ICD-10-CM | POA: Diagnosis not present

## 2021-07-28 DIAGNOSIS — E220 Acromegaly and pituitary gigantism: Secondary | ICD-10-CM | POA: Diagnosis not present

## 2021-07-28 DIAGNOSIS — M461 Sacroiliitis, not elsewhere classified: Secondary | ICD-10-CM | POA: Diagnosis not present

## 2021-07-28 DIAGNOSIS — Z1389 Encounter for screening for other disorder: Secondary | ICD-10-CM | POA: Diagnosis not present

## 2021-07-28 DIAGNOSIS — M47816 Spondylosis without myelopathy or radiculopathy, lumbar region: Secondary | ICD-10-CM | POA: Diagnosis not present

## 2021-07-28 DIAGNOSIS — G894 Chronic pain syndrome: Secondary | ICD-10-CM | POA: Diagnosis not present

## 2021-07-30 ENCOUNTER — Other Ambulatory Visit: Payer: Self-pay | Admitting: *Deleted

## 2021-07-30 ENCOUNTER — Other Ambulatory Visit: Payer: Self-pay

## 2021-07-30 NOTE — Patient Outreach (Signed)
[facility]) Care Management  07/30/2021  Alison Taylor Apr 08, 1956 [id number]   [facility] outreach to Post hospitalized patient Alison Taylor was referred to [facility] on 06/29/21 for post hospital services after admission with diagnosis, Chronic obstructive pulmonary disease (COPD)    [place] care [facility])     She is able to verify HIPAA identifiers     Follow up assessment Spoke with her briefly as her Joslyn Hy, Quillian Quince needs her assistance for transportation to work   She reports she is doing ok except her pain of her right hip pain  She reports an injection for this pain on 07/22/21 was not effective and she is scheduled to receive further injection or "block" on 08/05/21  COPD Doing ok with a little shortness of breath (sob)  with moving and exertion She does report seasonal symptoms She was encouraged to outreach to her pulmonologist, Dr Raul Del [facility] clinic) to discuss any needed seasonal treatment plan change    Falls she denies falls in the last year   Preventive care Eye dr seen in January 2022 at [facility] eye  center, Dr polite  She does not see a dentist or podiatrist  Advance directive She does not have advance directives and declines the offer of assistance. She confirms she had offers from the pcp and hospital    Smoking She confirms she does smoke and "want to quit" reviewed [place] smoke cessation program She lives in [place] She has tried smoke cessation patches,  lozenges and Chantix with Dr Gust Brooms assistance but Chantix was recalled and she stopped its use   Patient Active Problem List   Diagnosis Date Noted   Tobacco use disorder 03/19/2020   Pyogenic arthritis of right hip ([place]) 10/09/2019   Chronic respiratory failure with hypoxia ([place]) 10/07/2019   Pyelonephritis 10/07/2019   Severe right groin pain 10/07/2019   Glucosuria with normal serum glucose 12/09/2017   Acute kidney injury ([place]) 08/65/7846    Complicated UTI (urinary tract infection) 04/27/2017   History of infection due to drug-resistant organism 04/27/2017   Type 2 diabetes mellitus without complication, with long-term current use of insulin ([place]) 03/30/2017   Acute respiratory failure with hypoxia and hypercapnia ([place]) 02/27/2017   Newly diagnosed diabetes ([place]) 12/06/2016   Acute cystitis 07/04/2016   Acromegaly and pituitary gigantism ([place]) 05/17/2016   Polyarthralgia 05/17/2016   Numbness and tingling in both hands 05/17/2016   Nausea 05/31/2014   Hypertriglyceridemia 03/27/2013   Pituitary adenoma ([place]) 03/27/2013   Deficiency anemia 03/20/2013   Spinal stenosis 03/20/2013   Thyroid disease 02/15/2013   Allergic rhinitis 02/15/2013   GERD (gastroesophageal reflux disease) 02/15/2013   Palpitations 02/15/2013   Chronic obstructive pulmonary disease with acute exacerbation ([place]) 01/10/2013   Contusion of right foot 10/30/2012   Right foot pain 10/30/2012   Current Outpatient Medications on File Prior to Visit  Medication Sig Dispense Refill   albuterol (VENTOLIN HFA) 108 (90 Base) MCG/ACT inhaler Inhale 1-2 puffs into the lungs every 6 (six) hours as needed for wheezing or shortness of breath.     ATROVENT HFA 17 MCG/ACT inhaler Inhale 2 puffs into the lungs in the morning, at noon, in the evening, and at bedtime.     b complex vitamins tablet Take 1 tablet by mouth daily.     Blood Glucose Monitoring Suppl (ONE TOUCH ULTRA 2) w/Device KIT SMARTSIG:1 Kit(s) Via Meter Every Morning     BREZTRI AEROSPHERE 160-9-4.8 MCG/ACT AERO  Inhale 2 puffs into the lungs 2 (two) times daily.     cabergoline (DOSTINEX) 0.5 MG tablet Take 0.5 mg by mouth 3 (three) times a week.     cyclobenzaprine (FLEXERIL) 10 MG tablet Take 10 mg by mouth 3 (three) times daily as needed for muscle spasms.     ezetimibe (ZETIA) 10 MG tablet Take 10 mg by mouth daily.     gabapentin (NEURONTIN) 800 MG tablet Take 800 mg by mouth 3 (three) times daily.      HYDROcodone-acetaminophen (NORCO) 7.5-325 MG tablet Take 1 tablet by mouth 4 (four) times daily.     LANTUS SOLOSTAR 100 UNIT/ML Solostar Pen Inject 35 Units into the skin daily at 12 noon.     levothyroxine (SYNTHROID) 137 MCG tablet Take 137 mcg by mouth daily before breakfast.     Multiple Vitamins-Minerals (OCUVITE EYE HEATLH GUMMIES PO) Take 2 each by mouth daily. (Patient not taking: Reported on 06/22/2021)     omeprazole (PRILOSEC) 20 MG capsule Take 20 mg by mouth daily as needed (for acid reflux).     ONETOUCH ULTRA test strip USE TO CHECK BLOOD SUGAR 3 TIMES A DAY     OXYGEN Inhale into the lungs.     predniSONE (DELTASONE) 10 MG tablet Take 6 tabs twice a day for 2 days, then take 4 tabs twice a day for 2 days, then take 4 tabs daily for 2 days, then take 3 tabs daily for 2 days, then take 2 tabs daily for 2 days, then take 1 tab daily as you are doing before. 60 tablet 0   predniSONE (DELTASONE) 10 MG tablet Take 1 tablet (10 mg total) by mouth daily.     predniSONE (DELTASONE) 5 MG tablet Take 5 mg by mouth daily.     PREMARIN vaginal cream Place 0.5 g vaginally 2 (two) times a week.     ULTICARE MINI PEN NEEDLES 31G X 6 MM MISC USE 5 TIMES A DAY AS DIRECTED     venlafaxine XR (EFFEXOR-XR) 75 MG 24 hr capsule Take 75 mg by mouth daily.     Vitamin D, Ergocalciferol, (DRISDOL) 1.25 MG (50000 UNIT) CAPS capsule Take 50,000 Units by mouth 2 (two) times a week.     No current facility-administered medications on file prior to visit.    Plan Patient agrees to care plan and follow up within the next 14-21 business days   Irene Collings L. Lavina Hamman, RN, BSN, [place] Coordinator Office number [phone] Main [facility] number [phone] Fax number [phone]

## 2021-08-05 DIAGNOSIS — M47816 Spondylosis without myelopathy or radiculopathy, lumbar region: Secondary | ICD-10-CM | POA: Diagnosis not present

## 2021-08-10 DIAGNOSIS — Z23 Encounter for immunization: Secondary | ICD-10-CM | POA: Diagnosis not present

## 2021-08-10 DIAGNOSIS — Z0000 Encounter for general adult medical examination without abnormal findings: Secondary | ICD-10-CM | POA: Diagnosis not present

## 2021-08-10 DIAGNOSIS — E785 Hyperlipidemia, unspecified: Secondary | ICD-10-CM | POA: Diagnosis not present

## 2021-08-10 DIAGNOSIS — Z9181 History of falling: Secondary | ICD-10-CM | POA: Diagnosis not present

## 2021-08-12 ENCOUNTER — Other Ambulatory Visit: Payer: Self-pay | Admitting: *Deleted

## 2021-08-12 NOTE — Patient Outreach (Signed)
[facility]) Care Management  08/12/2021  Alison Taylor [birth year] [id number]   [facility] Unsuccessful outreach  Alison Taylor was referred to [facility] on 06/29/21 for post hospital services after admission with diagnosis, Chronic obstructive pulmonary disease (COPD)    [place] care [facility])   Last outreach on 07/16/21  Outreach attempt to the home [phone]  No answer. [facility] RN CM left HIPAA [facility] Portability and Accountability Act) compliant voicemail message along with CM's contact info.   Plan: [facility] RN CM scheduled this patient for another call attempt within 4-7 business days  Unsuccessful outreach on 08/12/21   Alison Taylor L. Lavina Hamman, RN, BSN, [place] Coordinator Office number [phone] Mobile number [phone]  Main [facility] number [phone] Fax number [phone]

## 2021-08-16 DIAGNOSIS — J439 Emphysema, unspecified: Secondary | ICD-10-CM | POA: Diagnosis not present

## 2021-08-16 DIAGNOSIS — J9600 Acute respiratory failure, unspecified whether with hypoxia or hypercapnia: Secondary | ICD-10-CM | POA: Diagnosis not present

## 2021-08-16 DIAGNOSIS — J449 Chronic obstructive pulmonary disease, unspecified: Secondary | ICD-10-CM | POA: Diagnosis not present

## 2021-08-17 DIAGNOSIS — F17210 Nicotine dependence, cigarettes, uncomplicated: Secondary | ICD-10-CM | POA: Diagnosis not present

## 2021-08-17 DIAGNOSIS — Z79899 Other long term (current) drug therapy: Secondary | ICD-10-CM | POA: Diagnosis not present

## 2021-08-17 DIAGNOSIS — R300 Dysuria: Secondary | ICD-10-CM | POA: Diagnosis not present

## 2021-08-17 DIAGNOSIS — I10 Essential (primary) hypertension: Secondary | ICD-10-CM | POA: Diagnosis not present

## 2021-08-17 DIAGNOSIS — E079 Disorder of thyroid, unspecified: Secondary | ICD-10-CM | POA: Diagnosis not present

## 2021-08-17 DIAGNOSIS — R319 Hematuria, unspecified: Secondary | ICD-10-CM | POA: Diagnosis not present

## 2021-08-17 DIAGNOSIS — R1030 Lower abdominal pain, unspecified: Secondary | ICD-10-CM | POA: Diagnosis not present

## 2021-08-17 DIAGNOSIS — N390 Urinary tract infection, site not specified: Secondary | ICD-10-CM | POA: Diagnosis not present

## 2021-08-17 DIAGNOSIS — M069 Rheumatoid arthritis, unspecified: Secondary | ICD-10-CM | POA: Diagnosis not present

## 2021-08-17 DIAGNOSIS — J449 Chronic obstructive pulmonary disease, unspecified: Secondary | ICD-10-CM | POA: Diagnosis not present

## 2021-08-17 DIAGNOSIS — Z794 Long term (current) use of insulin: Secondary | ICD-10-CM | POA: Diagnosis not present

## 2021-08-17 DIAGNOSIS — E119 Type 2 diabetes mellitus without complications: Secondary | ICD-10-CM | POA: Diagnosis not present

## 2021-08-18 DIAGNOSIS — J9600 Acute respiratory failure, unspecified whether with hypoxia or hypercapnia: Secondary | ICD-10-CM | POA: Diagnosis not present

## 2021-08-18 DIAGNOSIS — J449 Chronic obstructive pulmonary disease, unspecified: Secondary | ICD-10-CM | POA: Diagnosis not present

## 2021-08-18 DIAGNOSIS — J439 Emphysema, unspecified: Secondary | ICD-10-CM | POA: Diagnosis not present

## 2021-08-19 DIAGNOSIS — I10 Essential (primary) hypertension: Secondary | ICD-10-CM | POA: Diagnosis not present

## 2021-08-19 DIAGNOSIS — N12 Tubulo-interstitial nephritis, not specified as acute or chronic: Secondary | ICD-10-CM | POA: Diagnosis not present

## 2021-08-19 DIAGNOSIS — F17210 Nicotine dependence, cigarettes, uncomplicated: Secondary | ICD-10-CM | POA: Diagnosis not present

## 2021-08-19 DIAGNOSIS — Z7952 Long term (current) use of systemic steroids: Secondary | ICD-10-CM | POA: Diagnosis not present

## 2021-08-19 DIAGNOSIS — E039 Hypothyroidism, unspecified: Secondary | ICD-10-CM | POA: Diagnosis not present

## 2021-08-19 DIAGNOSIS — Z881 Allergy status to other antibiotic agents status: Secondary | ICD-10-CM | POA: Diagnosis not present

## 2021-08-19 DIAGNOSIS — E119 Type 2 diabetes mellitus without complications: Secondary | ICD-10-CM | POA: Diagnosis not present

## 2021-08-19 DIAGNOSIS — Z882 Allergy status to sulfonamides status: Secondary | ICD-10-CM | POA: Diagnosis not present

## 2021-08-19 DIAGNOSIS — E220 Acromegaly and pituitary gigantism: Secondary | ICD-10-CM | POA: Diagnosis not present

## 2021-08-19 DIAGNOSIS — J441 Chronic obstructive pulmonary disease with (acute) exacerbation: Secondary | ICD-10-CM | POA: Diagnosis not present

## 2021-08-19 DIAGNOSIS — Z885 Allergy status to narcotic agent status: Secondary | ICD-10-CM | POA: Diagnosis not present

## 2021-08-19 DIAGNOSIS — R110 Nausea: Secondary | ICD-10-CM | POA: Diagnosis not present

## 2021-08-19 DIAGNOSIS — Z20822 Contact with and (suspected) exposure to covid-19: Secondary | ICD-10-CM | POA: Diagnosis not present

## 2021-08-19 DIAGNOSIS — Z79899 Other long term (current) drug therapy: Secondary | ICD-10-CM | POA: Diagnosis not present

## 2021-08-19 DIAGNOSIS — Z794 Long term (current) use of insulin: Secondary | ICD-10-CM | POA: Diagnosis not present

## 2021-08-20 DIAGNOSIS — Z794 Long term (current) use of insulin: Secondary | ICD-10-CM | POA: Diagnosis not present

## 2021-08-20 DIAGNOSIS — N12 Tubulo-interstitial nephritis, not specified as acute or chronic: Secondary | ICD-10-CM | POA: Diagnosis not present

## 2021-08-20 DIAGNOSIS — E039 Hypothyroidism, unspecified: Secondary | ICD-10-CM | POA: Diagnosis not present

## 2021-08-20 DIAGNOSIS — J441 Chronic obstructive pulmonary disease with (acute) exacerbation: Secondary | ICD-10-CM | POA: Diagnosis not present

## 2021-08-20 DIAGNOSIS — Z79899 Other long term (current) drug therapy: Secondary | ICD-10-CM | POA: Diagnosis not present

## 2021-08-20 DIAGNOSIS — E119 Type 2 diabetes mellitus without complications: Secondary | ICD-10-CM | POA: Diagnosis not present

## 2021-08-21 DIAGNOSIS — N12 Tubulo-interstitial nephritis, not specified as acute or chronic: Secondary | ICD-10-CM | POA: Diagnosis not present

## 2021-08-21 DIAGNOSIS — Z720 Tobacco use: Secondary | ICD-10-CM | POA: Diagnosis not present

## 2021-08-21 DIAGNOSIS — E039 Hypothyroidism, unspecified: Secondary | ICD-10-CM | POA: Diagnosis not present

## 2021-08-21 DIAGNOSIS — E119 Type 2 diabetes mellitus without complications: Secondary | ICD-10-CM | POA: Diagnosis not present

## 2021-08-21 DIAGNOSIS — Z794 Long term (current) use of insulin: Secondary | ICD-10-CM | POA: Diagnosis not present

## 2021-08-21 DIAGNOSIS — Z79899 Other long term (current) drug therapy: Secondary | ICD-10-CM | POA: Diagnosis not present

## 2021-08-21 DIAGNOSIS — J441 Chronic obstructive pulmonary disease with (acute) exacerbation: Secondary | ICD-10-CM | POA: Diagnosis not present

## 2021-08-22 DIAGNOSIS — E039 Hypothyroidism, unspecified: Secondary | ICD-10-CM | POA: Diagnosis not present

## 2021-08-22 DIAGNOSIS — E119 Type 2 diabetes mellitus without complications: Secondary | ICD-10-CM | POA: Diagnosis not present

## 2021-08-22 DIAGNOSIS — N12 Tubulo-interstitial nephritis, not specified as acute or chronic: Secondary | ICD-10-CM | POA: Diagnosis not present

## 2021-08-22 DIAGNOSIS — Z720 Tobacco use: Secondary | ICD-10-CM | POA: Diagnosis not present

## 2021-08-22 DIAGNOSIS — J441 Chronic obstructive pulmonary disease with (acute) exacerbation: Secondary | ICD-10-CM | POA: Diagnosis not present

## 2021-08-25 ENCOUNTER — Other Ambulatory Visit: Payer: Self-pay | Admitting: *Deleted

## 2021-08-25 DIAGNOSIS — M461 Sacroiliitis, not elsewhere classified: Secondary | ICD-10-CM | POA: Diagnosis not present

## 2021-08-25 DIAGNOSIS — Z1389 Encounter for screening for other disorder: Secondary | ICD-10-CM | POA: Diagnosis not present

## 2021-08-25 DIAGNOSIS — G894 Chronic pain syndrome: Secondary | ICD-10-CM | POA: Diagnosis not present

## 2021-08-25 DIAGNOSIS — M47816 Spondylosis without myelopathy or radiculopathy, lumbar region: Secondary | ICD-10-CM | POA: Diagnosis not present

## 2021-08-25 DIAGNOSIS — E220 Acromegaly and pituitary gigantism: Secondary | ICD-10-CM | POA: Diagnosis not present

## 2021-08-25 NOTE — Patient Outreach (Signed)
[facility]) Care Management  08/25/2021  Alison Taylor August 21, 1956 [id number]   [facility] second Unsuccessful outreach  [name] was referred to [facility] on 06/29/21 for post hospital services after admission with diagnosis, Chronic obstructive pulmonary disease ([place] care [facility])   Last successful outreach on 07/16/21  With review of EPIC note pt with ED visit for UTI & pyelonephritis of left kidney on 10/3-5/22 PCP office transition of care outreach attempt on 08/24/21 unsuccessful  Outreach attempt to the home number  No answer. [facility] RN CM left HIPAA [facility] Portability and Accountability Act) compliant voicemail message along with CM's contact info.   Plan: [facility] RN CM scheduled this patient for another call attempt within 4-7 business days Unsuccessful outreach letter sent on 08/25/21 Unsuccessful outreach 08/12/21, 08/24/21    Joelene Millin L. Lavina Hamman, RN, BSN, [place] Coordinator Office number [phone] Mobile number [phone]  Main [facility] number [phone] Fax number [phone]

## 2021-09-01 ENCOUNTER — Other Ambulatory Visit: Payer: Self-pay | Admitting: *Deleted

## 2021-09-01 NOTE — Patient Outreach (Signed)
[facility]) Care Management  09/01/2021  Alison Taylor 1956-10-14 [id number]   [facility] third Unsuccessful outreach   Alison Taylor was referred to [facility] on 06/29/21 for post hospital services after admission with diagnosis, Chronic obstructive pulmonary disease ([place] care [facility])   Last successful outreach on 07/16/21   With review of EPIC note pt with ED visit for UTI & pyelonephritis of left kidney on 10/3-5/22 PCP office transition of care outreach attempt on 08/24/21 unsuccessful   Outreach attempt to the home number [phone] No answer. [facility] RN CM unable to leave a voicemail message along with CM's contact info. Mail box is full    Plan: [facility] RN CM scheduled this patient for another call attempt within 4-7 business days pending case closure  Unsuccessful outreach letter sent on 08/25/21 Unsuccessful outreach 08/12/21, 08/24/21, 09/01/21      Eyad Rochford L. Lavina Hamman, RN, BSN, [place] Coordinator Office number [phone] Mobile number [phone]  Main [facility] number [phone] Fax number [phone]

## 2021-09-06 DIAGNOSIS — E119 Type 2 diabetes mellitus without complications: Secondary | ICD-10-CM | POA: Diagnosis not present

## 2021-09-06 DIAGNOSIS — E039 Hypothyroidism, unspecified: Secondary | ICD-10-CM | POA: Diagnosis not present

## 2021-09-06 DIAGNOSIS — I10 Essential (primary) hypertension: Secondary | ICD-10-CM | POA: Diagnosis not present

## 2021-09-06 DIAGNOSIS — G8929 Other chronic pain: Secondary | ICD-10-CM | POA: Diagnosis not present

## 2021-09-06 DIAGNOSIS — M069 Rheumatoid arthritis, unspecified: Secondary | ICD-10-CM | POA: Diagnosis not present

## 2021-09-06 DIAGNOSIS — F17210 Nicotine dependence, cigarettes, uncomplicated: Secondary | ICD-10-CM | POA: Diagnosis not present

## 2021-09-06 DIAGNOSIS — R350 Frequency of micturition: Secondary | ICD-10-CM | POA: Diagnosis not present

## 2021-09-06 DIAGNOSIS — Z79891 Long term (current) use of opiate analgesic: Secondary | ICD-10-CM | POA: Diagnosis not present

## 2021-09-06 DIAGNOSIS — R300 Dysuria: Secondary | ICD-10-CM | POA: Diagnosis not present

## 2021-09-06 DIAGNOSIS — Z885 Allergy status to narcotic agent status: Secondary | ICD-10-CM | POA: Diagnosis not present

## 2021-09-06 DIAGNOSIS — M7989 Other specified soft tissue disorders: Secondary | ICD-10-CM | POA: Diagnosis not present

## 2021-09-06 DIAGNOSIS — Z79899 Other long term (current) drug therapy: Secondary | ICD-10-CM | POA: Diagnosis not present

## 2021-09-06 DIAGNOSIS — R1011 Right upper quadrant pain: Secondary | ICD-10-CM | POA: Diagnosis not present

## 2021-09-06 DIAGNOSIS — R0602 Shortness of breath: Secondary | ICD-10-CM | POA: Diagnosis not present

## 2021-09-06 DIAGNOSIS — R1013 Epigastric pain: Secondary | ICD-10-CM | POA: Diagnosis not present

## 2021-09-06 DIAGNOSIS — J449 Chronic obstructive pulmonary disease, unspecified: Secondary | ICD-10-CM | POA: Diagnosis not present

## 2021-09-06 DIAGNOSIS — E785 Hyperlipidemia, unspecified: Secondary | ICD-10-CM | POA: Diagnosis not present

## 2021-09-06 DIAGNOSIS — R42 Dizziness and giddiness: Secondary | ICD-10-CM | POA: Diagnosis not present

## 2021-09-06 DIAGNOSIS — R110 Nausea: Secondary | ICD-10-CM | POA: Diagnosis not present

## 2021-09-06 DIAGNOSIS — Z882 Allergy status to sulfonamides status: Secondary | ICD-10-CM | POA: Diagnosis not present

## 2021-09-06 DIAGNOSIS — M549 Dorsalgia, unspecified: Secondary | ICD-10-CM | POA: Diagnosis not present

## 2021-09-06 DIAGNOSIS — R059 Cough, unspecified: Secondary | ICD-10-CM | POA: Diagnosis not present

## 2021-09-06 DIAGNOSIS — M25552 Pain in left hip: Secondary | ICD-10-CM | POA: Diagnosis not present

## 2021-09-06 DIAGNOSIS — R200 Anesthesia of skin: Secondary | ICD-10-CM | POA: Diagnosis not present

## 2021-09-06 DIAGNOSIS — Z881 Allergy status to other antibiotic agents status: Secondary | ICD-10-CM | POA: Diagnosis not present

## 2021-09-06 DIAGNOSIS — R1032 Left lower quadrant pain: Secondary | ICD-10-CM | POA: Diagnosis not present

## 2021-09-06 DIAGNOSIS — R309 Painful micturition, unspecified: Secondary | ICD-10-CM | POA: Diagnosis not present

## 2021-09-06 DIAGNOSIS — R600 Localized edema: Secondary | ICD-10-CM | POA: Diagnosis not present

## 2021-09-06 DIAGNOSIS — Z794 Long term (current) use of insulin: Secondary | ICD-10-CM | POA: Diagnosis not present

## 2021-09-06 DIAGNOSIS — N390 Urinary tract infection, site not specified: Secondary | ICD-10-CM | POA: Diagnosis not present

## 2021-09-09 DIAGNOSIS — J449 Chronic obstructive pulmonary disease, unspecified: Secondary | ICD-10-CM | POA: Diagnosis not present

## 2021-09-15 ENCOUNTER — Other Ambulatory Visit: Payer: Self-pay | Admitting: *Deleted

## 2021-09-15 DIAGNOSIS — M5416 Radiculopathy, lumbar region: Secondary | ICD-10-CM | POA: Diagnosis not present

## 2021-09-15 NOTE — Patient Outreach (Signed)
[place] [facility]) Care Management  09/15/2021  CALLAHAN PEDDIE [birth year] [id number]   [facility] Case closure   Mrs CAYLOR CERINO was referred to [facility] on 06/29/21 for post hospital services after admission with diagnosis, Chronic obstructive pulmonary disease ([place] care [facility])   Unsuccessful outreach letter sent on 08/25/21 Unsuccessful outreach 08/12/21, 08/24/21, 09/01/21 without a response   Plan [facility] RN CM will close case after unable to maintain contact with patient Case closure letters sent to patient and MD  Joelene Millin L. Lavina Hamman, RN, BSN, [place] Coordinator Office number [phone] Mobile number [phone]  Main [facility] number [phone] Fax number [phone]

## 2021-09-16 DIAGNOSIS — J439 Emphysema, unspecified: Secondary | ICD-10-CM | POA: Diagnosis not present

## 2021-09-16 DIAGNOSIS — J449 Chronic obstructive pulmonary disease, unspecified: Secondary | ICD-10-CM | POA: Diagnosis not present

## 2021-09-16 DIAGNOSIS — J9600 Acute respiratory failure, unspecified whether with hypoxia or hypercapnia: Secondary | ICD-10-CM | POA: Diagnosis not present

## 2021-09-18 DIAGNOSIS — J449 Chronic obstructive pulmonary disease, unspecified: Secondary | ICD-10-CM | POA: Diagnosis not present

## 2021-09-18 DIAGNOSIS — J439 Emphysema, unspecified: Secondary | ICD-10-CM | POA: Diagnosis not present

## 2021-09-18 DIAGNOSIS — J9600 Acute respiratory failure, unspecified whether with hypoxia or hypercapnia: Secondary | ICD-10-CM | POA: Diagnosis not present

## 2021-09-23 DIAGNOSIS — E220 Acromegaly and pituitary gigantism: Secondary | ICD-10-CM | POA: Diagnosis not present

## 2021-09-23 DIAGNOSIS — M461 Sacroiliitis, not elsewhere classified: Secondary | ICD-10-CM | POA: Diagnosis not present

## 2021-09-23 DIAGNOSIS — M47816 Spondylosis without myelopathy or radiculopathy, lumbar region: Secondary | ICD-10-CM | POA: Diagnosis not present

## 2021-09-23 DIAGNOSIS — Z1389 Encounter for screening for other disorder: Secondary | ICD-10-CM | POA: Diagnosis not present

## 2021-09-23 DIAGNOSIS — M4302 Spondylolysis, cervical region: Secondary | ICD-10-CM | POA: Diagnosis not present

## 2021-09-23 DIAGNOSIS — M549 Dorsalgia, unspecified: Secondary | ICD-10-CM | POA: Diagnosis not present

## 2021-09-28 DIAGNOSIS — F17210 Nicotine dependence, cigarettes, uncomplicated: Secondary | ICD-10-CM | POA: Diagnosis not present

## 2021-09-28 DIAGNOSIS — R0609 Other forms of dyspnea: Secondary | ICD-10-CM | POA: Diagnosis not present

## 2021-09-28 DIAGNOSIS — R053 Chronic cough: Secondary | ICD-10-CM | POA: Diagnosis not present

## 2021-09-28 DIAGNOSIS — J432 Centrilobular emphysema: Secondary | ICD-10-CM | POA: Diagnosis not present

## 2021-09-29 DIAGNOSIS — M5416 Radiculopathy, lumbar region: Secondary | ICD-10-CM | POA: Diagnosis not present

## 2021-09-30 DIAGNOSIS — M50223 Other cervical disc displacement at C6-C7 level: Secondary | ICD-10-CM | POA: Diagnosis not present

## 2021-09-30 DIAGNOSIS — M4802 Spinal stenosis, cervical region: Secondary | ICD-10-CM | POA: Diagnosis not present

## 2021-09-30 DIAGNOSIS — M50221 Other cervical disc displacement at C4-C5 level: Secondary | ICD-10-CM | POA: Diagnosis not present

## 2021-09-30 DIAGNOSIS — M50222 Other cervical disc displacement at C5-C6 level: Secondary | ICD-10-CM | POA: Diagnosis not present

## 2021-09-30 DIAGNOSIS — M4302 Spondylolysis, cervical region: Secondary | ICD-10-CM | POA: Diagnosis not present

## 2021-09-30 DIAGNOSIS — M48061 Spinal stenosis, lumbar region without neurogenic claudication: Secondary | ICD-10-CM | POA: Diagnosis not present

## 2021-10-05 DIAGNOSIS — J449 Chronic obstructive pulmonary disease, unspecified: Secondary | ICD-10-CM | POA: Diagnosis not present

## 2021-10-12 DIAGNOSIS — E11618 Type 2 diabetes mellitus with other diabetic arthropathy: Secondary | ICD-10-CM | POA: Diagnosis not present

## 2021-10-12 DIAGNOSIS — J449 Chronic obstructive pulmonary disease, unspecified: Secondary | ICD-10-CM | POA: Diagnosis not present

## 2021-10-12 DIAGNOSIS — E039 Hypothyroidism, unspecified: Secondary | ICD-10-CM | POA: Diagnosis not present

## 2021-10-12 DIAGNOSIS — M2550 Pain in unspecified joint: Secondary | ICD-10-CM | POA: Diagnosis not present

## 2021-10-12 DIAGNOSIS — I700 Atherosclerosis of aorta: Secondary | ICD-10-CM | POA: Diagnosis not present

## 2021-10-12 DIAGNOSIS — M1480 Arthropathies in other specified diseases classified elsewhere, unspecified site: Secondary | ICD-10-CM | POA: Diagnosis not present

## 2021-10-12 DIAGNOSIS — E220 Acromegaly and pituitary gigantism: Secondary | ICD-10-CM | POA: Diagnosis not present

## 2021-10-12 DIAGNOSIS — M4807 Spinal stenosis, lumbosacral region: Secondary | ICD-10-CM | POA: Diagnosis not present

## 2021-10-12 DIAGNOSIS — G720 Drug-induced myopathy: Secondary | ICD-10-CM | POA: Diagnosis not present

## 2021-10-12 DIAGNOSIS — E782 Mixed hyperlipidemia: Secondary | ICD-10-CM | POA: Diagnosis not present

## 2021-10-12 DIAGNOSIS — E559 Vitamin D deficiency, unspecified: Secondary | ICD-10-CM | POA: Diagnosis not present

## 2021-10-12 DIAGNOSIS — J9612 Chronic respiratory failure with hypercapnia: Secondary | ICD-10-CM | POA: Diagnosis not present

## 2021-12-17 DIAGNOSIS — R0602 Shortness of breath: Secondary | ICD-10-CM | POA: Insufficient documentation

## 2021-12-17 DIAGNOSIS — I5030 Unspecified diastolic (congestive) heart failure: Secondary | ICD-10-CM | POA: Insufficient documentation

## 2021-12-17 DIAGNOSIS — I509 Heart failure, unspecified: Secondary | ICD-10-CM | POA: Insufficient documentation

## 2022-02-08 ENCOUNTER — Other Ambulatory Visit
Admission: RE | Admit: 2022-02-08 | Discharge: 2022-02-08 | Disposition: A | Discharge status: Home / Self Care | Payer: Medicare Other | Source: Ambulatory Visit | Attending: Cardiology | Admitting: Cardiology

## 2022-02-08 DIAGNOSIS — I509 Heart failure, unspecified: Secondary | ICD-10-CM | POA: Insufficient documentation

## 2022-02-08 DIAGNOSIS — R0602 Shortness of breath: Secondary | ICD-10-CM | POA: Diagnosis present

## 2022-02-08 LAB — BRAIN NATRIURETIC PEPTIDE: B Natriuretic Peptide: 44.2 pg/mL (ref 0.0–100.0)

## 2022-03-27 DIAGNOSIS — L03032 Cellulitis of left toe: Secondary | ICD-10-CM | POA: Insufficient documentation

## 2022-04-13 ENCOUNTER — Telehealth (INDEPENDENT_AMBULATORY_CARE_PROVIDER_SITE_OTHER): Payer: Self-pay

## 2022-04-13 NOTE — Telephone Encounter (Signed)
Spoke with [facility] @ Dr. Nathen May office to request CTA results for URGENT referral. Waiting on results to come over - Anderson Malta states that she will upload to proficient.

## 2022-04-30 ENCOUNTER — Encounter (INDEPENDENT_AMBULATORY_CARE_PROVIDER_SITE_OTHER): Payer: Self-pay | Admitting: Vascular Surgery

## 2022-05-04 ENCOUNTER — Telehealth (INDEPENDENT_AMBULATORY_CARE_PROVIDER_SITE_OTHER): Payer: Self-pay

## 2022-05-04 ENCOUNTER — Encounter (INDEPENDENT_AMBULATORY_CARE_PROVIDER_SITE_OTHER): Payer: Self-pay | Admitting: Vascular Surgery

## 2022-05-04 ENCOUNTER — Ambulatory Visit (INDEPENDENT_AMBULATORY_CARE_PROVIDER_SITE_OTHER): Payer: Medicare Other | Admitting: Vascular Surgery

## 2022-05-04 VITALS — BP 98/62 | HR 96 | Resp 18 | Ht 69.0 in | Wt 191.8 lb

## 2022-05-04 DIAGNOSIS — F17200 Nicotine dependence, unspecified, uncomplicated: Secondary | ICD-10-CM | POA: Diagnosis not present

## 2022-05-04 DIAGNOSIS — I7025 Atherosclerosis of native arteries of other extremities with ulceration: Secondary | ICD-10-CM

## 2022-05-04 DIAGNOSIS — E119 Type 2 diabetes mellitus without complications: Secondary | ICD-10-CM | POA: Diagnosis not present

## 2022-05-04 DIAGNOSIS — M4800 Spinal stenosis, site unspecified: Secondary | ICD-10-CM | POA: Diagnosis not present

## 2022-05-04 DIAGNOSIS — Z794 Long term (current) use of insulin: Secondary | ICD-10-CM

## 2022-05-04 NOTE — Assessment & Plan Note (Signed)
Could be contributing to some of her lower extremity symptoms some, but I think the majority of her symptoms are from vascular disease

## 2022-05-04 NOTE — Telephone Encounter (Signed)
I attempted to contact the patient to let them know about the left leg angio scheduled with Dr. Lucky Cowboy on 05/06/22 with a 9:00 am arrival time to the MM. A message was left for a return call. Patient returned my call and pre-procedure instructions were discussed and patient stated she understood.

## 2022-05-04 NOTE — Patient Instructions (Signed)
Endovascular Therapy for Peripheral Vascular Disease Endovascular therapy is a procedure to widen a narrowed blood vessel and improve blood flow. It is used to treat peripheral vascular disease (PVD). PVD may also be called peripheral artery disease (PAD) or poor circulation.  Endovascular means the procedure is done inside your artery, using a long, thin tube (catheter). The catheter is inserted into an incision in your leg and moved up your artery until it reaches the narrow part. A balloon or a small metal tube (stent) may be used to help widen the narrow artery and keep it open. Your health care provider may recommend endovascular therapy if lifestyle changes and medicines have not improved your PVD. In some cases, such as when more than one artery is affected, you may need more than one procedure. Tell a health care provider about: Any allergies you have. All medicines you are taking, including vitamins, herbs, eye drops, creams, and over-the-counter medicines. Any problems you or family members have had with anesthetic medicines. Any blood disorders you have. Any surgeries you have had. Any medical conditions you have. Whether you are pregnant or may be pregnant. What are the risks? Generally, this is a safe procedure. However, problems may occur, including: Infection. Bleeding. Allergic reactions to medicines, materials, or dyes. Damage to other structures or organs. This may include nerve damage or kidney problems. Blood clots, heart attack, or stroke. The stent moving out of place, becoming blocked, or not working. What happens before the procedure? Medicines Ask your health care provider about: Changing or stopping your regular medicines. This is especially important if you are taking diabetes medicines or blood thinners. Taking medicines such as aspirin and ibuprofen. These medicines can thin your blood. Do not take these medicines unless your health care provider tells you to take  them. Taking over-the-counter medicines, vitamins, herbs, and supplements. Tests You will have blood tests and a physical exam. You may have other tests, such as: Ankle-brachial index (ABI). This test compares blood pressure in your ankle and arm. This can indicate narrowing or blockage in your leg arteries. Doppler ultrasound. This test uses sound waves to check blood flow. CT scan. This test uses dye to check blood flow and blockages in your leg arteries. MRI. Electrocardiogram (ECG). This test checks the electrical patterns and rhythms of the heart. Surgery safety Ask your health care provider: How your surgery site will be marked. What steps will be taken to help prevent infection. These steps may include: Removing hair at the surgery site. Washing skin with a germ-killing soap. Taking antibiotic medicine. General instructions Do not use any products that contain nicotine or tobacco for at least 4 weeks before the procedure. These products include cigarettes, chewing tobacco, and vaping devices, such as e-cigarettes. If you need help quitting, ask your health care provider. Follow instructions from your health care provider about eating or drinking restrictions. Plan to have a responsible adult take you home from the hospital or clinic. Plan to have a responsible adult care for you for the time you are told after you leave the hospital or clinic. This is important. What happens during the procedure?  An IV will be inserted into one of your veins. You will be given one or more of the following: A medicine to help you relax (sedative). A medicine to numb the area (local anesthetic). A puncture or small incision will be made in your upper thigh area, in the femoral artery or the iliac artery. Rarely, a puncture or incision   may be made in the ankle area. A catheter will be inserted into the artery. It will be moved up the artery to reach the blocked or narrow part using a type of X-ray  (fluoroscopy). When the catheter is near the blocked or narrow part of the artery, contrast dye will be injected that makes the narrowing or blockage visible on the X-ray. Another catheter with a small, deflated balloon will be inserted into the artery. It will be moved up the artery to reach the blocked or narrow part. The small balloon will be inflated to widen the narrow part of the artery. The balloon will be deflated. A stent may be placed in the widened part of the artery to keep the artery open. The catheters will be removed. Your puncture or incision may be closed with a stitch (suture) or skin glue. Your puncture or incision may be covered with a bandage (dressing). The procedure may vary among health care providers and hospitals. What happens after the procedure? Your blood pressure, heart rate, breathing rate, and blood oxygen level will be monitored until you leave the hospital or clinic. You will need to stay in bed as directed. You will be encouraged to drink fluids to flush the dye out of your body. You will be given pain medicine as needed. If you were given a sedative during the procedure, it can affect you for several hours. Do not drive or operate machinery until your health care provider says that it is safe. Summary Endovascular therapy is a procedure to widen a narrowed blood vessel and improve blood flow. This procedure may be recommended if lifestyle changes and medicines are not enough to improve your peripheral vascular disease (PVD). After the procedure, you will need to stay in bed and you will be encouraged to drink fluids to flush the dye out of your body. This information is not intended to replace advice given to you by your health care provider. Make sure you discuss any questions you have with your health care provider. Document Revised: 11/26/2021 Document Reviewed: 05/05/2020 Elsevier Patient Education  2023 Elsevier Inc.  

## 2022-05-04 NOTE — Assessment & Plan Note (Signed)
blood glucose control important in reducing the progression of atherosclerotic disease. Also, involved in wound healing. On appropriate medications.  

## 2022-05-04 NOTE — H&P (View-Only) (Signed)
Patient ID: [name], female   DOB: Sep 04, 1956, 66 y.o.   MRN: [id number]  Chief Complaint  Patient presents with   Establish Care    Referred by Dr Tobie Lords    HPI Nema Oatley Picazo is a 66 y.o. female.  I am asked to see the patient by Cyndi Bender for evaluation of nonhealing ulcerations and disabling claudication symptoms of the left lower extremity.  Patient describes claudication symptoms that have been going on for several months.  It was felt initially it might be her back due to some problems there as well.  This has continued to worsen.  She has now developed several ulcerations on the tips of her toes on the left foot.  No fever or chills.  She has been on antibiotics 3 times for this.  Claudication symptoms are fairly disabling and started after only short distances of walking.  She has some pain in the right leg and some discoloration on the toes of the right foot as well, but not as severe.  She had a CT angiogram which demonstrated left common iliac artery occlusion and was referred for further evaluation and treatment.  I do not have those images for review but do have the report.     Past Medical History:  Diagnosis Date   COPD (chronic obstructive pulmonary disease) ([place])    Lumbar stenosis    Spinal stenosis of sacral region    Thyroid disease    hypo   Tobacco abuse     No past surgical history on file.   Family History  Problem Relation Age of Onset   ALS Brother    Breast cancer Neg Hx   No bleeding disorders or clotting disorders.  No aneurysms   Social History   Tobacco Use   Smoking status: Every Day    Packs/day: 0.50    Types: Cigarettes   Smokeless tobacco: Never  Vaping Use   Vaping Use: Never used  Substance Use Topics   Alcohol use: No    Alcohol/week: 0.0 standard drinks of alcohol   Drug use: No     Allergies  Allergen Reactions   Iodinated Contrast Media Other (See Comments), Anaphylaxis, Hives and Rash    Reaction:  Redness   Other reaction(s): Other (See Comments) Reaction:  Redness  IVP DYE CAUSED REDNESS AND SOB.  DOES FINE WITH PREMED. LBW 4/15 Reaction:  Redness     Iodine Hives    Has to have prep to use   Other Anaphylaxis    Iv contrast dye   Cephalexin Hives and Rash    Patient has tolerated cefepime previously (Nov 2020 admission)    Rosuvastatin     Other reaction(s): Other (See Comments), Other (See Comments) Severe leg cramps Severe leg cramps    Moxifloxacin Hcl    Acetaminophen-Codeine Palpitations, Hives and Rash    Per pt, she is able to take tylenol, as well as hydrocodone & Norco. Pt reports only unable to tolerate tylenol #3    Codeine Nausea Only   Ibuprofen Rash   Oxycodone-Acetaminophen Rash   Sulfa Antibiotics Anxiety    Other reaction(s): Other (See Comments), Other (See Comments) anxiety anxiety     Current Outpatient Medications  Medication Sig Dispense Refill   acetaminophen (TYLENOL) 325 MG tablet Take by mouth.     albuterol (VENTOLIN HFA) 108 (90 Base) MCG/ACT inhaler Inhale 1-2 puffs into the lungs every 6 (six) hours as needed for wheezing or shortness of  breath.     aspirin 81 MG chewable tablet Chew by mouth.     ATROVENT HFA 17 MCG/ACT inhaler Inhale 2 puffs into the lungs in the morning, at noon, in the evening, and at bedtime.     b complex vitamins tablet Take 1 tablet by mouth daily.     BD PEN NEEDLE MICRO U/F 32G X 6 MM MISC SMARTSIG:1 SUB-Q 5 Times Daily     Blood Glucose Monitoring Suppl (ONE TOUCH ULTRA 2) w/Device KIT SMARTSIG:1 Kit(s) Via Meter Every Morning     cabergoline (DOSTINEX) 0.5 MG tablet Take 0.5 mg by mouth 3 (three) times a week.     cetirizine (ZYRTEC) 10 MG tablet Take by mouth.     cyclobenzaprine (FLEXERIL) 10 MG tablet Take 10 mg by mouth 3 (three) times daily as needed for muscle spasms.     ezetimibe (ZETIA) 10 MG tablet Take 10 mg by mouth daily.     fentaNYL (DURAGESIC) 12 MCG/HR USE 1 PATCH EVERY 72 HOURS FOR BACK PAIN      fluticasone furoate-vilanterol (BREO ELLIPTA) 100-25 MCG/ACT AEPB Inhale into the lungs.     furosemide (LASIX) 40 MG tablet Take 40 mg by mouth daily.     gabapentin (NEURONTIN) 800 MG tablet Take 800 mg by mouth 3 (three) times daily.     HUMALOG KWIKPEN 100 UNIT/ML KwikPen SMARTSIG:5 Unit(s) SUB-Q     ipratropium-albuterol (DUONEB) 0.5-2.5 (3) MG/3ML SOLN Inhale into the lungs.     LANTUS SOLOSTAR 100 UNIT/ML Solostar Pen Inject 35 Units into the skin daily at 12 noon.     levothyroxine (SYNTHROID) 137 MCG tablet Take 137 mcg by mouth daily before breakfast.     Multiple Vitamins-Minerals (OCUVITE EYE HEATLH GUMMIES PO) Take 2 each by mouth daily.     naloxone (NARCAN) 2 MG/2ML injection 2 mg into each nostril once as needed (overdose).     omeprazole (PRILOSEC) 20 MG capsule Take 20 mg by mouth daily as needed (for acid reflux).     ondansetron (ZOFRAN) 4 MG tablet SMARTSIG:1 Tablet(s) By Mouth Every 12 Hours     ONETOUCH ULTRA test strip USE TO CHECK BLOOD SUGAR 3 TIMES A DAY     oxyCODONE-acetaminophen (PERCOCET/ROXICET) 5-325 MG tablet Take by mouth.     OXYGEN 2 L.     predniSONE (DELTASONE) 10 MG tablet Take 1 tablet (10 mg total) by mouth daily.     PREMARIN vaginal cream Place 0.5 g vaginally 2 (two) times a week.     spironolactone (ALDACTONE) 25 MG tablet Take 25 mg by mouth daily.     ULTICARE MINI PEN NEEDLES 31G X 6 MM MISC USE 5 TIMES A DAY AS DIRECTED     umeclidinium bromide (INCRUSE ELLIPTA) 62.5 MCG/ACT AEPB Inhale into the lungs.     venlafaxine XR (EFFEXOR-XR) 75 MG 24 hr capsule Take 75 mg by mouth daily.     Vitamin D, Ergocalciferol, (DRISDOL) 1.25 MG (50000 UNIT) CAPS capsule Take 50,000 Units by mouth 2 (two) times a week.     No current facility-administered medications for this visit.      REVIEW OF SYSTEMS (Negative unless checked)  Constitutional: []Weight loss  []Fever  []Chills Cardiac: []Chest pain   []Chest pressure   []Palpitations   []Shortness of  breath when laying flat   []Shortness of breath at rest   []Shortness of breath with exertion. Vascular:  [x]Pain in legs with walking   []Pain in legs at rest   []  Pain in legs when laying flat   []Claudication   []Pain in feet when walking  []Pain in feet at rest  []Pain in feet when laying flat   []History of DVT   []Phlebitis   []Swelling in legs   []Varicose veins   [x]Non-healing ulcers Pulmonary:   []Uses home oxygen   []Productive cough   []Hemoptysis   []Wheeze  []COPD   []Asthma Neurologic:  []Dizziness  []Blackouts   []Seizures   []History of stroke   []History of TIA  []Aphasia   []Temporary blindness   []Dysphagia   []Weakness or numbness in arms   []Weakness or numbness in legs Musculoskeletal:  [x]Arthritis   []Joint swelling   []Joint pain   []Low back pain Hematologic:  []Easy bruising  []Easy bleeding   []Hypercoagulable state   []Anemic  []Hepatitis Gastrointestinal:  []Blood in stool   []Vomiting blood  []Gastroesophageal reflux/heartburn   []Abdominal pain Genitourinary:  []Chronic kidney disease   []Difficult urination  []Frequent urination  []Burning with urination   []Hematuria Skin:  []Rashes   [x]Ulcers   [x]Wounds Psychological:  []History of anxiety   [] History of major depression.    Physical Exam BP 98/62 (BP Location: Left Arm)   Pulse 96   Resp 18   Ht 5' 9" (1.753 m)   Wt 191 lb 12.8 oz (87 kg)   LMP 12/10/1987   BMI 28.32 kg/m  Gen:  WD/WN, NAD Head: [place]/AT, No temporalis wasting.  Ear/Nose/Throat: Hearing grossly intact, nares w/o erythema or drainage, oropharynx w/o Erythema/Exudate Eyes: Conjunctiva clear, sclera non-icteric  Neck: trachea midline.  No JVD.  Pulmonary:  Good air movement, respirations not labored, no use of accessory muscles  Cardiac: RRR, no JVD Vascular:  Vessel Right Left  Radial Palpable Palpable                          PT 2+ Trace  DP Trace Not palpable   Gastrointestinal:. No masses, surgical incisions, or  scars. Musculoskeletal: M/S 5/5 throughout.  Sluggish capillary refill in the left foot and lower leg.  Right great toe has some mild purplish discoloration as well Neurologic: Sensation grossly intact in extremities.  Symmetrical.  Speech is fluent. Motor exam as listed above. Psychiatric: Judgment intact, Mood & affect appropriate for pt's clinical situation. Dermatologic: Several small scabs are present on multiple toes of the left foot.    Radiology No results found.  Labs Recent Results (from the past 2160 hour(s))  Brain natriuretic peptide     Status: None   Collection Time: 02/08/22  3:51 PM  Result Value Ref Range   B Natriuretic Peptide 44.2 0.0 - 100.0 pg/mL    Comment: Performed at [facility], [address] [place], [place] [address]    Assessment/Plan:  Atherosclerosis of native arteries of the extremities with ulceration [facility]) Patient has disabling claudication symptoms as well as nonhealing ulceration with a left iliac artery occlusion seen on CT scan.  This is a critical and limb threatening situation.  I discussed pathophysiology and natural history of peripheral arterial disease.  I discussed the reason and rationale for why this is a critical and limb threatening situation.  I recommended an angiogram with potential revascularization to be performed in the very near future at her convenience.  Risks and benefits were discussed and she is agreeable to proceed.  Type 2 diabetes mellitus without complication, with long-term current use of insulin (HCC) blood  glucose control important in reducing the progression of atherosclerotic disease. Also, involved in wound healing. On appropriate medications.   Tobacco use disorder Represents an atherosclerotic risk factor in one of the causes of her disease.  Cessation will be of benefit.  Spinal stenosis Could be contributing to some of her lower extremity symptoms some, but I think the majority of her symptoms  are from vascular disease      [name] 05/04/2022, 11:20 AM   This note was created with Dragon medical transcription system.  Any errors from dictation are unintentional.

## 2022-05-04 NOTE — Assessment & Plan Note (Signed)
Represents an atherosclerotic risk factor in one of the causes of her disease.  Cessation will be of benefit.

## 2022-05-04 NOTE — Assessment & Plan Note (Signed)
Patient has disabling claudication symptoms as well as nonhealing ulceration with a left iliac artery occlusion seen on CT scan.  This is a critical and limb threatening situation.  I discussed pathophysiology and natural history of peripheral arterial disease.  I discussed the reason and rationale for why this is a critical and limb threatening situation.  I recommended an angiogram with potential revascularization to be performed in the very near future at her convenience.  Risks and benefits were discussed and she is agreeable to proceed.

## 2022-05-04 NOTE — Progress Notes (Signed)
Patient ID: Alison Taylor, female   DOB: 1955/12/07, 66 y.o.   MRN: [id number]  Chief Complaint  Patient presents with   Establish Care    Referred by Dr Tobie Lords    HPI Devoiry Corriher Rabon is a 66 y.o. female.  I am asked to see the patient by Cyndi Bender for evaluation of nonhealing ulcerations and disabling claudication symptoms of the left Taylor extremity.  Patient describes claudication symptoms that have been going on for several months.  It was felt initially it might be her back due to some problems there as well.  This has continued to worsen.  She has now developed several ulcerations on the tips of her toes on the left foot.  No fever or chills.  She has been on antibiotics 3 times for this.  Claudication symptoms are fairly disabling and started after only short distances of walking.  She has some pain in the right leg and some discoloration on the toes of the right foot as well, but not as severe.  She had a CT angiogram which demonstrated left common iliac artery occlusion and was referred for further evaluation and treatment.  I do not have those images for review but do have the report.     Past Medical History:  Diagnosis Date   COPD (chronic obstructive pulmonary disease) ([place])    Lumbar stenosis    Spinal stenosis of sacral region    Thyroid disease    hypo   Tobacco abuse     No past surgical history on file.   Family History  Problem Relation Age of Onset   ALS Brother    Breast cancer Neg Hx   No bleeding disorders or clotting disorders.  No aneurysms   Social History   Tobacco Use   Smoking status: Every Day    Packs/day: 0.50    Types: Cigarettes   Smokeless tobacco: Never  Vaping Use   Vaping Use: Never used  Substance Use Topics   Alcohol use: No    Alcohol/week: 0.0 standard drinks of alcohol   Drug use: No     Allergies  Allergen Reactions   Iodinated Contrast Media Other (See Comments), Anaphylaxis, Hives and Rash    Reaction:  Redness   Other reaction(s): Other (See Comments) Reaction:  Redness  IVP DYE CAUSED REDNESS AND SOB.  DOES FINE WITH PREMED. LBW 4/15 Reaction:  Redness     Iodine Hives    Has to have prep to use   Other Anaphylaxis    Iv contrast dye   Cephalexin Hives and Rash    Patient has tolerated cefepime previously (Nov 2020 admission)    Rosuvastatin     Other reaction(s): Other (See Comments), Other (See Comments) Severe leg cramps Severe leg cramps    Moxifloxacin Hcl    Acetaminophen-Codeine Palpitations, Hives and Rash    Per pt, she is able to take tylenol, as well as hydrocodone & Norco. Pt reports only unable to tolerate tylenol #3    Codeine Nausea Only   Ibuprofen Rash   Oxycodone-Acetaminophen Rash   Sulfa Antibiotics Anxiety    Other reaction(s): Other (See Comments), Other (See Comments) anxiety anxiety     Current Outpatient Medications  Medication Sig Dispense Refill   acetaminophen (TYLENOL) 325 MG tablet Take by mouth.     albuterol (VENTOLIN HFA) 108 (90 Base) MCG/ACT inhaler Inhale 1-2 puffs into the lungs every 6 (six) hours as needed for wheezing or shortness of  breath.     aspirin 81 MG chewable tablet Chew by mouth.     ATROVENT HFA 17 MCG/ACT inhaler Inhale 2 puffs into the lungs in the morning, at noon, in the evening, and at bedtime.     b complex vitamins tablet Take 1 tablet by mouth daily.     BD PEN NEEDLE MICRO U/F 32G X 6 MM MISC SMARTSIG:1 SUB-Q 5 Times Daily     Blood Glucose Monitoring Suppl (ONE TOUCH ULTRA 2) w/Device KIT SMARTSIG:1 Kit(s) Via Meter Every Morning     cabergoline (DOSTINEX) 0.5 MG tablet Take 0.5 mg by mouth 3 (three) times a week.     cetirizine (ZYRTEC) 10 MG tablet Take by mouth.     cyclobenzaprine (FLEXERIL) 10 MG tablet Take 10 mg by mouth 3 (three) times daily as needed for muscle spasms.     ezetimibe (ZETIA) 10 MG tablet Take 10 mg by mouth daily.     fentaNYL (DURAGESIC) 12 MCG/HR USE 1 PATCH EVERY 72 HOURS FOR BACK PAIN      fluticasone furoate-vilanterol (BREO ELLIPTA) 100-25 MCG/ACT AEPB Inhale into the lungs.     furosemide (LASIX) 40 MG tablet Take 40 mg by mouth daily.     gabapentin (NEURONTIN) 800 MG tablet Take 800 mg by mouth 3 (three) times daily.     HUMALOG KWIKPEN 100 UNIT/ML KwikPen SMARTSIG:5 Unit(s) SUB-Q     ipratropium-albuterol (DUONEB) 0.5-2.5 (3) MG/3ML SOLN Inhale into the lungs.     LANTUS SOLOSTAR 100 UNIT/ML Solostar Pen Inject 35 Units into the skin daily at 12 noon.     levothyroxine (SYNTHROID) 137 MCG tablet Take 137 mcg by mouth daily before breakfast.     Multiple Vitamins-Minerals (OCUVITE EYE HEATLH GUMMIES PO) Take 2 each by mouth daily.     naloxone (NARCAN) 2 MG/2ML injection 2 mg into each nostril once as needed (overdose).     omeprazole (PRILOSEC) 20 MG capsule Take 20 mg by mouth daily as needed (for acid reflux).     ondansetron (ZOFRAN) 4 MG tablet SMARTSIG:1 Tablet(s) By Mouth Every 12 Hours     ONETOUCH ULTRA test strip USE TO CHECK BLOOD SUGAR 3 TIMES A DAY     oxyCODONE-acetaminophen (PERCOCET/ROXICET) 5-325 MG tablet Take by mouth.     OXYGEN 2 L.     predniSONE (DELTASONE) 10 MG tablet Take 1 tablet (10 mg total) by mouth daily.     PREMARIN vaginal cream Place 0.5 g vaginally 2 (two) times a week.     spironolactone (ALDACTONE) 25 MG tablet Take 25 mg by mouth daily.     ULTICARE MINI PEN NEEDLES 31G X 6 MM MISC USE 5 TIMES A DAY AS DIRECTED     umeclidinium bromide (INCRUSE ELLIPTA) 62.5 MCG/ACT AEPB Inhale into the lungs.     venlafaxine XR (EFFEXOR-XR) 75 MG 24 hr capsule Take 75 mg by mouth daily.     Vitamin D, Ergocalciferol, (DRISDOL) 1.25 MG (50000 UNIT) CAPS capsule Take 50,000 Units by mouth 2 (two) times a week.     No current facility-administered medications for this visit.      REVIEW OF SYSTEMS (Negative unless checked)  Constitutional: []Weight loss  []Fever  []Chills Cardiac: []Chest pain   []Chest pressure   []Palpitations   []Shortness of  breath when laying flat   []Shortness of breath at rest   []Shortness of breath with exertion. Vascular:  [x]Pain in legs with walking   []Pain in legs at rest   []  Pain in legs when laying flat   []Claudication   []Pain in feet when walking  []Pain in feet at rest  []Pain in feet when laying flat   []History of DVT   []Phlebitis   []Swelling in legs   []Varicose veins   [x]Non-healing ulcers Pulmonary:   []Uses home oxygen   []Productive cough   []Hemoptysis   []Wheeze  []COPD   []Asthma Neurologic:  []Dizziness  []Blackouts   []Seizures   []History of stroke   []History of TIA  []Aphasia   []Temporary blindness   []Dysphagia   []Weakness or numbness in arms   []Weakness or numbness in legs Musculoskeletal:  [x]Arthritis   []Joint swelling   []Joint pain   []Low back pain Hematologic:  []Easy bruising  []Easy bleeding   []Hypercoagulable state   []Anemic  []Hepatitis Gastrointestinal:  []Blood in stool   []Vomiting blood  []Gastroesophageal reflux/heartburn   []Abdominal pain Genitourinary:  []Chronic kidney disease   []Difficult urination  []Frequent urination  []Burning with urination   []Hematuria Skin:  []Rashes   [x]Ulcers   [x]Wounds Psychological:  []History of anxiety   [] History of major depression.    Physical Exam BP 98/62 (BP Location: Left Arm)   Pulse 96   Resp 18   Ht 5' 9" (1.753 m)   Wt 191 lb 12.8 oz (87 kg)   LMP 12/10/1987   BMI 28.32 kg/m  Gen:  WD/WN, NAD Head: [place]/AT, No temporalis wasting.  Ear/Nose/Throat: Hearing grossly intact, nares w/o erythema or drainage, oropharynx w/o Erythema/Exudate Eyes: Conjunctiva clear, sclera non-icteric  Neck: trachea midline.  No JVD.  Pulmonary:  Good air movement, respirations not labored, no use of accessory muscles  Cardiac: RRR, no JVD Vascular:  Vessel Right Left  Radial Palpable Palpable                          PT 2+ Trace  DP Trace Not palpable   Gastrointestinal:. No masses, surgical incisions, or  scars. Musculoskeletal: M/S 5/5 throughout.  Sluggish capillary refill in the left foot and Taylor leg.  Right great toe has some mild purplish discoloration as well Neurologic: Sensation grossly intact in extremities.  Symmetrical.  Speech is fluent. Motor exam as listed above. Psychiatric: Judgment intact, Mood & affect appropriate for pt's clinical situation. Dermatologic: Several small scabs are present on multiple toes of the left foot.    Radiology No results found.  Labs Recent Results (from the past 2160 hour(s))  Brain natriuretic peptide     Status: None   Collection Time: 02/08/22  3:51 PM  Result Value Ref Range   B Natriuretic Peptide 44.2 0.0 - 100.0 pg/mL    Comment: Performed at [facility], [address] [place], [place] [address]    Assessment/Plan:  Atherosclerosis of native arteries of the extremities with ulceration [facility]) Patient has disabling claudication symptoms as well as nonhealing ulceration with a left iliac artery occlusion seen on CT scan.  This is a critical and limb threatening situation.  I discussed pathophysiology and natural history of peripheral arterial disease.  I discussed the reason and rationale for why this is a critical and limb threatening situation.  I recommended an angiogram with potential revascularization to be performed in the very near future at her convenience.  Risks and benefits were discussed and she is agreeable to proceed.  Type 2 diabetes mellitus without complication, with long-term current use of insulin (HCC) blood  glucose control important in reducing the progression of atherosclerotic disease. Also, involved in wound healing. On appropriate medications.   Tobacco use disorder Represents an atherosclerotic risk factor in one of the causes of her disease.  Cessation will be of benefit.  Spinal stenosis Could be contributing to some of her Taylor extremity symptoms some, but I think the majority of her symptoms  are from vascular disease      [name] 05/04/2022, 11:20 AM   This note was created with Dragon medical transcription system.  Any errors from dictation are unintentional.

## 2022-05-06 ENCOUNTER — Encounter
Admission: RE | Disposition: A | Discharge status: Home / Self Care | Payer: Self-pay | Source: Home / Self Care | Attending: Vascular Surgery

## 2022-05-06 ENCOUNTER — Ambulatory Visit
Admission: RE | Admit: 2022-05-06 | Discharge: 2022-05-06 | Disposition: A | Discharge status: Home / Self Care | Payer: Medicare Other | Attending: Vascular Surgery | Admitting: Vascular Surgery

## 2022-05-06 ENCOUNTER — Encounter: Payer: Self-pay | Admitting: Vascular Surgery

## 2022-05-06 DIAGNOSIS — L97529 Non-pressure chronic ulcer of other part of left foot with unspecified severity: Secondary | ICD-10-CM | POA: Insufficient documentation

## 2022-05-06 DIAGNOSIS — I739 Peripheral vascular disease, unspecified: Secondary | ICD-10-CM

## 2022-05-06 DIAGNOSIS — E1151 Type 2 diabetes mellitus with diabetic peripheral angiopathy without gangrene: Secondary | ICD-10-CM | POA: Insufficient documentation

## 2022-05-06 DIAGNOSIS — F17210 Nicotine dependence, cigarettes, uncomplicated: Secondary | ICD-10-CM | POA: Insufficient documentation

## 2022-05-06 DIAGNOSIS — I70245 Atherosclerosis of native arteries of left leg with ulceration of other part of foot: Secondary | ICD-10-CM

## 2022-05-06 DIAGNOSIS — E11621 Type 2 diabetes mellitus with foot ulcer: Secondary | ICD-10-CM | POA: Diagnosis not present

## 2022-05-06 DIAGNOSIS — Z794 Long term (current) use of insulin: Secondary | ICD-10-CM | POA: Diagnosis not present

## 2022-05-06 DIAGNOSIS — M4800 Spinal stenosis, site unspecified: Secondary | ICD-10-CM | POA: Insufficient documentation

## 2022-05-06 DIAGNOSIS — L97909 Non-pressure chronic ulcer of unspecified part of unspecified lower leg with unspecified severity: Secondary | ICD-10-CM

## 2022-05-06 HISTORY — PX: LOWER EXTREMITY ANGIOGRAPHY: CATH118251

## 2022-05-06 HISTORY — DX: Peripheral vascular disease, unspecified: I73.9

## 2022-05-06 LAB — CREATININE, SERUM
Creatinine, Ser: 0.97 mg/dL (ref 0.44–1.00)
GFR, Estimated: >60 mL/min (ref >60)

## 2022-05-06 LAB — GLUCOSE, CAPILLARY: Glucose-Capillary: 120 mg/dL — ABNORMAL HIGH (ref 70–99)

## 2022-05-06 LAB — BUN: BUN: 16 mg/dL (ref 8–23)

## 2022-05-06 SURGERY — LOWER EXTREMITY ANGIOGRAPHY
Anesthesia: Moderate Sedation | Laterality: Left

## 2022-05-06 MED ORDER — HEPARIN SODIUM (PORCINE) 1000 UNIT/ML IJ SOLN
INTRAMUSCULAR | Status: AC
  Filled 2022-05-06: qty 10

## 2022-05-06 MED ORDER — FAMOTIDINE 20 MG PO TABS: 40.0000 mg | ORAL_TABLET | Freq: Once | ORAL | Status: AC | PRN

## 2022-05-06 MED ORDER — ONDANSETRON HCL 4 MG/2ML IJ SOLN: 4.0000 mg | Freq: Four times a day (QID) | INTRAMUSCULAR | Status: DC | PRN

## 2022-05-06 MED ORDER — FENTANYL CITRATE (PF) 100 MCG/2ML IJ SOLN
INTRAMUSCULAR | Status: DC | PRN
  Administered 2022-05-06: 25 ug via INTRAVENOUS

## 2022-05-06 MED ORDER — FAMOTIDINE 20 MG PO TABS
ORAL_TABLET | ORAL | Status: AC
  Administered 2022-05-06: 40 mg via ORAL
  Filled 2022-05-06: qty 2

## 2022-05-06 MED ORDER — SODIUM CHLORIDE 0.9 % IV SOLN: INTRAVENOUS | Status: DC

## 2022-05-06 MED ORDER — DIPHENHYDRAMINE HCL 50 MG/ML IJ SOLN
INTRAMUSCULAR | Status: AC
  Administered 2022-05-06: 50 mg via INTRAVENOUS
  Filled 2022-05-06: qty 1

## 2022-05-06 MED ORDER — FENTANYL CITRATE (PF) 100 MCG/2ML IJ SOLN
INTRAMUSCULAR | Status: AC
  Filled 2022-05-06: qty 2

## 2022-05-06 MED ORDER — MIDAZOLAM HCL 2 MG/2ML IJ SOLN
INTRAMUSCULAR | Status: DC | PRN
  Administered 2022-05-06: 1 mg via INTRAVENOUS

## 2022-05-06 MED ORDER — MIDAZOLAM HCL 2 MG/ML PO SYRP: 8.0000 mg | ORAL_SOLUTION | Freq: Once | ORAL | Status: DC | PRN

## 2022-05-06 MED ORDER — MIDAZOLAM HCL 5 MG/5ML IJ SOLN
INTRAMUSCULAR | Status: AC
  Filled 2022-05-06: qty 5

## 2022-05-06 MED ORDER — METHYLPREDNISOLONE SODIUM SUCC 125 MG IJ SOLR
125.0000 mg | Freq: Once | INTRAMUSCULAR | Status: AC | PRN
Start: 2022-05-06 — End: 2022-05-06

## 2022-05-06 MED ORDER — DIPHENHYDRAMINE HCL 50 MG/ML IJ SOLN
50.0000 mg | Freq: Once | INTRAMUSCULAR | Status: AC | PRN
Start: 2022-05-06 — End: 2022-05-06

## 2022-05-06 MED ORDER — HYDROMORPHONE HCL 1 MG/ML IJ SOLN: 1.0000 mg | Freq: Once | INTRAMUSCULAR | Status: DC | PRN

## 2022-05-06 MED ORDER — METHYLPREDNISOLONE SODIUM SUCC 125 MG IJ SOLR
INTRAMUSCULAR | Status: AC
  Administered 2022-05-06: 125 mg via INTRAVENOUS
  Filled 2022-05-06: qty 2

## 2022-05-06 MED ORDER — VANCOMYCIN HCL IN DEXTROSE 1-5 GM/200ML-% IV SOLN
1000.0000 mg | INTRAVENOUS | Status: AC
  Administered 2022-05-06: 1000 mg via INTRAVENOUS
  Filled 2022-05-06: qty 200

## 2022-05-06 SURGICAL SUPPLY — 8 items
CATH ANGIO 5F PIGTAIL 65CM (CATHETERS) ×1 IMPLANT
COVER PROBE U/S 5X48 (MISCELLANEOUS) ×1 IMPLANT
DEVICE STARCLOSE SE CLOSURE (Vascular Products) ×1 IMPLANT
PACK ANGIOGRAPHY (CUSTOM PROCEDURE TRAY) ×2 IMPLANT
SHEATH BRITE TIP 5FRX11 (SHEATH) ×1 IMPLANT
SYR MEDRAD MARK 7 150ML (SYRINGE) ×1 IMPLANT
TUBING CONTRAST HIGH PRESS 72 (TUBING) ×1 IMPLANT
WIRE GUIDERIGHT .035X150 (WIRE) ×1 IMPLANT

## 2022-05-06 NOTE — Discharge Instructions (Signed)
Dr. Ozella Almond office will call you with surgery appointment.

## 2022-05-06 NOTE — Interval H&P Note (Signed)
History and Physical Interval Note:  05/06/2022 8:57 AM  Alison Taylor  has presented today for surgery, with the diagnosis of LT Leg Angiography   ASO with ulceration   CONTRAST ALLERGY BARD Rep.  The various methods of treatment have been discussed with the patient and family. After consideration of risks, benefits and other options for treatment, the patient has consented to  Procedure(s): Taylor Extremity Angiography (Left) as a surgical intervention.  The patient's history has been reviewed, patient examined, no change in status, stable for surgery.  I have reviewed the patient's chart and labs.  Questions were answered to the patient's satisfaction.     [name]

## 2022-05-26 ENCOUNTER — Telehealth (INDEPENDENT_AMBULATORY_CARE_PROVIDER_SITE_OTHER): Payer: Self-pay

## 2022-05-26 NOTE — Telephone Encounter (Signed)
Spoke with the patient and she is scheduled on 06/02/22 for a left femoral endarterectomy, bilateral iliac stent placement with Dr. Lucky Cowboy. Pre-surgical instructions were discussed and will be mailed.

## 2022-05-27 ENCOUNTER — Encounter
Admission: RE | Admit: 2022-05-27 | Discharge: 2022-05-27 | Disposition: A | Discharge status: Home / Self Care | Payer: Medicare Other | Source: Ambulatory Visit | Attending: Vascular Surgery | Admitting: Vascular Surgery

## 2022-05-27 ENCOUNTER — Other Ambulatory Visit (INDEPENDENT_AMBULATORY_CARE_PROVIDER_SITE_OTHER): Payer: Self-pay | Admitting: Nurse Practitioner

## 2022-05-27 VITALS — Ht 69.5 in | Wt 189.0 lb

## 2022-05-27 DIAGNOSIS — E119 Type 2 diabetes mellitus without complications: Secondary | ICD-10-CM

## 2022-05-27 DIAGNOSIS — Z01812 Encounter for preprocedural laboratory examination: Secondary | ICD-10-CM

## 2022-05-27 HISTORY — DX: Heart failure, unspecified: I50.9

## 2022-05-27 HISTORY — DX: Type 2 diabetes mellitus without complications: E11.9

## 2022-05-27 HISTORY — DX: Hypothyroidism, unspecified: E03.9

## 2022-05-27 HISTORY — DX: Unspecified osteoarthritis, unspecified site: M19.90

## 2022-05-27 HISTORY — DX: Essential (primary) hypertension: I10

## 2022-05-27 NOTE — Patient Instructions (Addendum)
Your procedure is scheduled on: Wednesday June 02, 2022. Report to Day Surgery inside [facility] 2nd floor, stop by admissions desk before getting on elevator.  To find out your arrival time please call [phone] between 1PM - 3PM on Tuesday June 01, 2022.  Remember: Instructions that are not followed completely may result in serious medical risk,  up to and including death, or upon the discretion of your surgeon and anesthesiologist your  surgery may need to be rescheduled.     _X__ 1. Do not eat food or drink fluids after midnight the night before your procedure.                 No chewing gum or hard candies. .  __X__2.  On the morning of surgery brush your teeth with toothpaste and water, you                may rinse your mouth with mouthwash if you wish.  Do not swallow any toothpaste or mouthwash.     _X__ 3.  No Alcohol for 24 hours before or after surgery.   _X__ 4.  Do Not Smoke or use e-cigarettes For 24 Hours Prior to Your Surgery.                 Do not use any chewable tobacco products for at least 6 hours prior to                 Surgery.  _X__  5.  Do not use any recreational drugs (marijuana, cocaine, heroin, ecstasy, MDMA or other)                For at least one week prior to your surgery.  Combination of these drugs with anesthesia                May have life threatening results.  ____  6.  Bring all medications with you on the day of surgery if instructed.   __X__  7.  Notify your doctor if there is any change in your medical condition      (cold, fever, infections).     Do not wear jewelry, make-up, hairpins, clips or nail polish. Do not wear lotions, powders, or perfumes. You may wear deodorant. Do not shave 48 hours prior to surgery.  Do not bring valuables to the hospital.    [facility] is not responsible for any belongings or valuables.  Contacts, dentures or bridgework may not be worn into surgery. Leave your suitcase in  the car. After surgery it may be brought to your room. For patients admitted to the hospital, discharge time is determined by your treatment team.   Patients discharged the day of surgery will not be allowed to drive home.   Make arrangements for someone to be with you for the first 24 hours of your Same Day Discharge.   __X__ Take these medicines the morning of surgery with A SIP OF WATER:    1. levothyroxine (SYNTHROID) 137 MCG  2. omeprazole (PRILOSEC) 20 MG   3. gabapentin (NEURONTIN) 800 MG  4. cabergoline (DOSTINEX) 0.5 MG  5. oxyCODONE-acetaminophen (PERCOCET/ROXICET) 5-325 MG (if needed)  6. venlafaxine XR (EFFEXOR-XR) 75 MG  ____ Fleet Enema (as directed)   __X__ Use CHG Soap (or wipes) as directed  ____ Use Benzoyl Peroxide Gel as instructed  __X__ Use inhalers on the day of surgery  fluticasone furoate-vilanterol (BREO ELLIPTA) 100-25 MCG/ACT AEPB  umeclidinium bromide (INCRUSE ELLIPTA)  62.5 MCG/ACT AEPB ____ Stop metformin 2 days prior to surgery    __X__ Take 1/2 of usual insulin dose the night before surgery. No insulin the morning          of surgery. LANTUS SOLOSTAR 100 UNIT/ML Solostar  ____ Call your PCP, cardiologist, or Pulmonologist if taking Coumadin/Plavix/aspirin and ask when to stop before your surgery.   __X__ One Week prior to surgery- Stop Anti-inflammatories such as Ibuprofen, Aleve, Advil, Motrin, meloxicam (MOBIC), diclofenac, etodolac, ketorolac, Toradol, Daypro, piroxicam, Goody's or BC powders. OK TO USE TYLENOL IF NEEDED   __X__ Stop supplements until after surgery.    ____ Bring C-Pap to the hospital.    If you have any questions regarding your pre-procedure instructions,  Please call Pre-admit Testing at [phone]

## 2022-05-28 ENCOUNTER — Encounter: Payer: Self-pay | Admitting: Vascular Surgery

## 2022-05-28 NOTE — Progress Notes (Addendum)
Perioperative Services  Pre-Admission/Anesthesia Testing Clinical Review  Date: 05/31/22  Patient Demographics:  Name: Alison Taylor DOB:   Aug 30, 1956 MRN:   [id number]  Planned Surgical Procedure(s):    Case: [id number] Date/Time: 06/02/22 1042   Procedures:      ENDARTERECTOMY FEMORAL (Left)     INSERTION OF ILIAC STENT (Bilateral)     APPLICATION OF CELL SAVER   Anesthesia type: General   Pre-op diagnosis: ASO WITH ULCERATION   Location: [facility] OR ROOM 07 / [facility] ORS FOR ANESTHESIA GROUP   Surgeons: Alison Needy, MD   NOTE: Available PAT nursing documentation and vital signs have been reviewed. Clinical nursing staff has updated patient's PMH/PSHx, current medication list, and drug allergies/intolerances to ensure comprehensive history available to assist in medical decision making as it pertains to the aforementioned surgical procedure and anticipated anesthetic course. Extensive review of available clinical information performed. Alison Taylor PMH and PSHx updated with any diagnoses/procedures that  may have been inadvertently omitted during her intake with the pre-admission testing department's nursing staff.  Clinical Discussion:  Alison Taylor is a 66 y.o. female who is submitted for pre-surgical anesthesia review and clearance prior to her undergoing the above procedure. Patient is a Current Smoker (22.5 pack years). Pertinent PMH includes: HFpEF, Type 2 MI (etiology AECOPD), palpitations, PAD, aortic atherosclerosis, HTN, HLD, T2DM, hypothyroidism, COPD, chronic respiratory failure (on supplemental oxygen; 2 L/[place]), GERD (on daily PPI), anemia, acromegalia, OA, lumbosacral spinal stenosis, paraesthesias (hands), pituitary adenoma, anxiety.   Patient is followed by cardiology Alison Fetters, MD). She was last seen in the cardiology clinic on 05/26/2022; notes reviewed.  At the time of her clinic visit, patient reported to be doing "okay" from a cardiovascular perspective.  Patient denied any  episodes of chest pain, however continued to have episodes of chronic exertional dyspnea related to her underlying COPD, chronic respiratory failure, and ongoing smoking history.  Patient on supplemental oxygen and followed by pulmonary medicine.  Patient had multiple ED visits in 2022 for CHF and chest discomfort.  Patient with orthopnea requiring her to sleep on 2-3 pillows at night.  She denied any PND, palpitations, significant peripheral edema, vertiginous symptoms, or presyncope/syncope.  Patient with past medical history significant for cardiovascular diagnoses.  Patient suffered a type II MI on 10/31/2020.  Troponins were trended; 102 --> 304 --> 458.  Troponin elevation felt to be secondary to an AECOPD .  Last TTE was performed on 12/16/2021 revealing normal left ventricular systolic function with an EF of 60 to 65%. Diastolic Doppler parameters consistent with abnormal relaxation (G1DD).  Right ventricle normal in size with normal systolic function.  PASP could not be estimated due to insufficient TR signal.  There was moderate LVH.  There was no significant valvular regurgitation noted.  There was no evidence of a significant transvalvular gradient to suggest stenosis.  Patient underwent myocardial PET CT on 12/21/2021 revealing a normal left ventricular systolic function with an EF of >60%.  There were no significant coronary artery calcifications noted on the attenuation correction CT's.  There was a small fixed perfusion defect involving the mid inferolateral segment consistent with possible artifact, however unable to rule out scintigraphic evidence of subtle scar.  Perfusion defect was completely reversible.  It was a small (1 cm) anterior pericardial effusion noted.  Patient underwent lower extremity arterial duplex on 03/29/2022 revealing advanced LEFT lower extremity PAD with dampened Doppler waveforms and the CFA, SFA, popliteal arteries, and posterior tibial arteries, consistent with  obstruction in the outflow vessels.  Waveforms could not be identified in the distal anterior tibial artery or distal posterior tibial artery.  Due to low pressures in the anterior posterior tibial arteries, ABIs cannot be calculated.  Peripheral vascular catheterization performed on 05/06/2022 revealing 60-70% stenosis in the RIGHT common iliac artery, in addition to a total occlusion in the LEFT common iliac artery just beyond its origin extending through the external iliac artery and CFA.  The LEFT CFA did reconstituted through collaterals at the femoral bifurcation, although it did appear diseased.  RIGHT femoral artery showed 50% stenosis in the RIGHT external iliac artery, but the RIGHT common femoral artery and femoral bifurcation was widely patent.  Blood pressure well controlled at 110/68 on currently prescribed diuretic monotherapy.  She is on ezetimibe for her HLD diagnosis and further ASCVD prevention.  T2DM well-controlled on currently prescribed regimen; last HgbA1c was 6.1% when checked on 12/17/2021.  Patient able to climb a flight of stairs, however this activity is associated with exertional dyspnea.  Therefore, per DASI, patient's functional capacity felt to be </= 4 METS of activity.  No changes were made to her medication regimen.  Patient follow-up with outpatient cardiology in 3 months or sooner if needed.  Alison Taylor is scheduled for an LEFT FEMORAL ENDARTERECTOMY FEMORAL, INSERTION OF BILATERAL ILIAC STENTS on 06/02/2022 with Dr. Festus Barren, MD.  Given patient's past medical history significant for cardiovascular and cardiopulmonary diagnoses, presurgical clearances were sought from patient's primary cardiologist and pulmonologist.  Specialty clearances were obtained as follows:   Per pulmonary medicine Alison Ide, MD), "this patient is optimized for surgery and may proceed with the planned procedural course with a MODERATE risk of significant perioperative cardiopulmonary  complications".  Per cardiology, "this is overall a high risk surgery. She underwent a PET stress test in February 2023 that was low risk and showed no significant coronary calcifications. She states she is able to climb a flight of stairs, albeit slowly, with some shortness of breath. I do not believe she has any modifiable cardiovascular risk factors at this time to mitigate her risk for this high risk vascular surgery. No further testing prior to her vascular surgery procedure. She is at least MODERATE risk driven primarily by her severe pulmonary disease and ongoing tobacco use".    In review of her medication reconciliation, it is noted the patient is on daily antiplatelet therapy. Per standing orders for this particular procedure from Dr. Wyn Quaker, patient may continue her daily low dose ASA throughout her perioperative course. Patient denies previous perioperative complications with anesthesia in the past. In review of the available records, it is noted that patient underwent a TIVA + PNB anesthetic course at [facility] (ASA IV) in 03/2020 without documented complications.      05/27/2022   10:31 AM 05/06/2022    1:00 PM 05/06/2022   12:30 PM  Vitals with BMI  Height 5' 9.5"    Weight 189 lbs    BMI 27.52    Systolic  154 120  Diastolic  58 62  Pulse  85 84    Providers/Specialists:   NOTE: Primary physician provider listed below. Patient may have been seen by APP or partner within same practice.   PROVIDER ROLE / SPECIALTY LAST Shanna Cisco, MD Vascular Surgery (Surgeon) 05/04/2022  Ronal Fear, NP Primary Care Provider ???  Sena Slate, MD Cardiology 05/26/2022  Ned Clines, MD Pulmonary Medicine 02/16/2022  Allergies:  Iodinated contrast media, Iodine, Other, Cephalexin, Rosuvastatin, Moxifloxacin hcl, Acetaminophen-codeine, Codeine, Ibuprofen, and Sulfa antibiotics  Current Home Medications:   No current facility-administered  medications for this encounter.    acetaminophen (TYLENOL) 325 MG tablet   albuterol (VENTOLIN HFA) 108 (90 Base) MCG/ACT inhaler   aspirin 81 MG chewable tablet   ATROVENT HFA 17 MCG/ACT inhaler   b complex vitamins tablet   BD PEN NEEDLE MICRO U/F 32G X 6 MM MISC   Blood Glucose Monitoring Suppl (ONE TOUCH ULTRA 2) w/Device KIT   cabergoline (DOSTINEX) 0.5 MG tablet   cetirizine (ZYRTEC) 10 MG tablet   cyclobenzaprine (FLEXERIL) 10 MG tablet   ezetimibe (ZETIA) 10 MG tablet   fentaNYL (DURAGESIC) 12 MCG/HR   fluticasone furoate-vilanterol (BREO ELLIPTA) 100-25 MCG/ACT AEPB   furosemide (LASIX) 40 MG tablet   gabapentin (NEURONTIN) 800 MG tablet   HUMALOG KWIKPEN 100 UNIT/ML KwikPen   ipratropium-albuterol (DUONEB) 0.5-2.5 (3) MG/3ML SOLN   LANTUS SOLOSTAR 100 UNIT/ML Solostar Pen   levothyroxine (SYNTHROID) 137 MCG tablet   Multiple Vitamins-Minerals (OCUVITE EYE HEATLH GUMMIES PO)   naloxone (NARCAN) 2 MG/2ML injection   omeprazole (PRILOSEC) 20 MG capsule   ondansetron (ZOFRAN) 4 MG tablet   ONETOUCH ULTRA test strip   oxyCODONE-acetaminophen (PERCOCET/ROXICET) 5-325 MG tablet   OXYGEN   predniSONE (DELTASONE) 10 MG tablet   PREMARIN vaginal cream   spironolactone (ALDACTONE) 25 MG tablet   ULTICARE MINI PEN NEEDLES 31G X 6 MM MISC   umeclidinium bromide (INCRUSE ELLIPTA) 62.5 MCG/ACT AEPB   venlafaxine XR (EFFEXOR-XR) 75 MG 24 hr capsule   Vitamin D, Ergocalciferol, (DRISDOL) 1.25 MG (50000 UNIT) CAPS capsule   History:   Past Medical History:  Diagnosis Date   (HFpEF) heart failure with preserved ejection fraction (HCC)    a.) TTE 03/17/2020: EF >55%, no RWMAs, norm RVSF; b.) TTE 12/19/2020: EF >55%, mild LVH, mild RAE, mild MAC, triv MR/TR/PR, G1DD; c.) TTE 12/16/2021: EF >55%, mod LVD, G1DD.   Acromegalia (HCC)    a.) on cabergoline   Anemia    Anxiety    Aortic atherosclerosis (HCC)    Arthritis    Chronic prescription opiate use    a.) TD fentanyl +  oxy/APAP; b.) has naloxone Rx   Chronic respiratory failure with hypoxia, on home oxygen therapy (HCC)    a.) chronic supplemental oxygen at 2L/[place]   COPD (chronic obstructive pulmonary disease) (HCC)    GERD (gastroesophageal reflux disease)    HLD (hyperlipidemia)    Hypertension    Hypothyroidism    Lumbosacral spinal stenosis    Myalgia due to statin    PAD (peripheral artery disease) (HCC) 05/06/2022   a.) PVC 05/06/2022 --> 60-70% RCIA, occlusion LCIA, 50% RFA   Palpitations    Paresthesias (hands)    Pituitary adenoma (HCC)    Tobacco abuse    Type 2 diabetes mellitus treated with insulin (HCC)    Type 2 MI (myocardial infarction) (HCC) 10/31/2020   a.) troponins 102 --> 304 --> 458; etiology felt to be secondary to AECOPD   Vitamin D deficiency    Past Surgical History:  Procedure Laterality Date   ABDOMINAL HYSTERECTOMY  1989   CHOLECYSTECTOMY     LOWER EXTREMITY ANGIOGRAPHY Left 05/06/2022   Procedure: Lower Extremity Angiography;  Surgeon: Alison Needy, MD;  Location: [facility] INVASIVE CV LAB;  Service: Cardiovascular;  Laterality: Left;   NECK SURGERY  2002   OVARIAN CYST SURGERY Left 1983  TIBIA FRACTURE SURGERY Left 1980   Family History  Problem Relation Age of Onset   ALS Brother    Breast cancer Neg Hx    Social History   Tobacco Use   Smoking status: Every Day    Packs/day: 0.50    Years: 45.00    Total pack years: 22.50    Types: Cigarettes   Smokeless tobacco: Never  Vaping Use   Vaping Use: Never used  Substance Use Topics   Alcohol use: No    Alcohol/week: 0.0 standard drinks of alcohol   Drug use: No    Pertinent Clinical Results:   LABS: Labs reviewed: Acceptable for surgery.   Ref Range & Units 04/13/2022  WBC 3.6 - 11.2 10*9/L 12.1 High    RBC 3.95 - 5.13 10*12/L 4.06   HGB 11.3 - 14.9 g/dL 84.6   HCT 96.2 - 95.2 % 36.5   MCV 77.6 - 95.7 fL 89.9   MCH 25.9 - 32.4 pg 30.4   MCHC 32.0 - 36.0 g/dL 84.1   RDW 32.4 - 40.1 % 14.1   MPV  6.8 - 10.7 fL 8.3   Platelet 150 - 450 10*9/L 317   nRBC <=4 /100 WBCs 0   Neutrophils % % 59.2   Lymphocytes % % 31.0   Monocytes % % 5.4   Eosinophils % % 4.1   Basophils % % 0.3   Absolute Neutrophils 1.8 - 7.8 10*9/L 7.1   Absolute Lymphocytes 1.1 - 3.6 10*9/L 3.7 High    Absolute Monocytes 0.3 - 0.8 10*9/L 0.7   Absolute Eosinophils 0.0 - 0.5 10*9/L 0.5   Absolute Basophils 0.0 - 0.1 10*9/L 0.0   Specimen Collected: 04/13/22 21:19   Performed by: [facility] H[facility] LABORAT[facility] Last Resulted: 04/13/22 21:57  Received From: [facility]  Result Received: 04/29/22 07:15    Ref Range & Units 04/13/2022  Sodium 135 - 145 mmol/L 138   Potassium 3.5 - 5.0 mmol/L 4.8   Chloride 98 - 107 mmol/L 102   CO2 22.0 - 32.0 mmol/L 30.0   Anion Gap 7 - 15 mmol/L 6 Low    BUN 7 - 21 mg/dL 20   Creatinine 0.27 - 1.00 mg/dL 2.53   BUN/Creatinine Ratio  22   eGFR CKD-EPI (2021) Female >=60 mL/min/1.70m2 72   Glucose 74 - 106 mg/dL 664 High    Calcium 8.5 - 10.2 mg/dL 9.2   Albumin 3.4 - 5.0 g/dL 4.0   Total Protein 6.5 - 8.3 g/dL 6.9   Total Bilirubin 0.1 - 1.2 mg/dL 0.6   AST 14 - 38 U/L 29   ALT <35 U/L 26   Alkaline Phosphatase 38 - 126 U/L 91   Specimen Collected: 04/13/22 21:19   Performed by: [facility] LABORATORY Last Resulted: 04/13/22 22:11  Received From: [facility]  Result Received: 04/29/22 07:15    ECG: Date: 04/13/2022 Rate: 88 bpm Rhythm: normal sinus Axis (leads I and aVF): Left axis deviation Intervals: PR 130 ms. QRS 86 ms. QTc 447 ms. ST segment and T wave changes: No evidence of acute ST segment elevation or depression Comparison: Similar to previous tracing obtained on 03/26/2022 NOTE: Tracing obtained at [facility]; unable for review. Above based on cardiologist's interpretation.   IMAGING / PROCEDURES: CTA AORTIC DISSECTION performed on 04/14/2022 No aortic dissection. 0.5 cm penetrating atherosclerotic ulcer involving the inferior aortic  arch. No acute findings in the chest, abdomen, or pelvis.  1.9 cm left lower lobe pole  intermediate attenuation lesion, which is indeterminate. Recommend further evaluation with non emergent CT renal mass protocol or MRI abdomen with IV contrast.  DIAGNOSTIC RADIOGRAPHS OF CHEST 2 VIEWS performed on 04/13/2022 The lungs appear clear.  No pleural effusion or pneumothorax identified.  The cardiomediastinal silhouette is within normal limits for technique.  There are degenerative changes of the spine. No radiographic evidence of acute cardiopulmonary pathology.  PVL ARTERIAL DUPLEX LOWER EXTREMITY LEFT performed on 03/29/2022 Advanced left lower extremity peripheral arterial disease with dampened Doppler waveforms in the common femoral, superficial femoral, popliteal arteries, and posterior tibial arteries, suggesting obstruction in the outflow vessels (confirmed on comparison CT). Waveforms could not be identified in the distal anterior tibial artery and distal posterior tibial artery.  Due to low pressures in the anterior and posterior tibial arteries, ABIs could not be calculated.  Hypoechoic lesion left popliteal fossa measuring 2.5 x 1.4 cm may represent a Baker's cyst.  CTA ABDOMINAL AORTA AND BILATERAL ILIOFEM RUNOFF W WO CONTRAST performed on 03/27/2022 Extensive atherosclerotic disease of the aortoiliac tree and branch vessels, including complete occlusion of the left CIA.  The left EIA and IMA are diminutive in size but patent, reconstituted via collateral pelvic vessels.  Widely patent outflow and runoff circulation bilaterally with minimal atherosclerotic disease.  PET CT MYOCARDIAL PERFUSION MULTIPLE performed on 12/21/2021 There is a very small, mild in severity, fixed perfusion defect involving the mid inferolateral segment. This is consistent with possible artifact (attenuation, pacemaker / AICD lead) but, cannot rule out subtle scar.  There is a very small, subtle, completely  reversible perfusion defect involving the mid anterolateral segment. This is consistent with possible artifact (motion and misregistration of attenuation CT) but, cannot rule out subtle ischemia.  Left ventricular systolic function is normal. Post stress the ejection fraction is > 60%.  No significant coronary calcifications were noted on the attenuation CT  A small localized (1 cm) anterior pericardial effusion is noted  Low risk study  TRANSTHORACIC ECHOCARDIOGRAM performed on performed on 12/16/2021 The left ventricle is normal in size with moderately increased wall thickness.  The left ventricular systolic function is normal, LVEF is visually estimated at 60-65%.  There is grade I diastolic dysfunction (impaired relaxation).  The right ventricle is normal in size, with normal systolic function.  The pulmonary systolic pressure cannot be estimated due to insufficient TR signal.   Impression and Plan:  EMORIE KNIPFER has been referred for pre-anesthesia review and clearance prior to her undergoing the planned anesthetic and procedural courses. Available labs, pertinent testing, and imaging results were personally reviewed by me. This patient has been appropriately cleared by cardiology (MODERATE) and by pulmonary medicine (MODERATE) with the individually indicated risk of significant perioperative cardiovascular/cardiopulmonary complications.   Based on clinical review performed today (05/31/22), barring any significant acute changes in the patient's overall condition, it is anticipated that she will be able to proceed with the planned surgical intervention. Any acute changes in clinical condition may necessitate her procedure being postponed and/or cancelled. Patient will meet with anesthesia team (MD and/or CRNA) on the day of her procedure for preoperative evaluation/assessment. Questions regarding anesthetic course will be fielded at that time.   Pre-surgical instructions were reviewed with  the patient during her PAT appointment and questions were fielded by PAT clinical staff. Patient was advised that if any questions or concerns arise prior to her procedure then she should return a call to PAT and/or her surgeon's office to discuss.  Quentin Mulling, MSN, APRN,  FNP-C, CEN [facility]  Peri-operative Services Nurse Practitioner Phone: [phone] Fax: [phone] 05/31/22 12:25 PM  NOTE: This note has been prepared using Dragon dictation software. Despite my best ability to proofread, there is always the potential that unintentional transcriptional errors may still occur from this process.

## 2022-05-30 ENCOUNTER — Encounter: Payer: Self-pay | Admitting: Vascular Surgery

## 2022-06-02 ENCOUNTER — Other Ambulatory Visit: Payer: Self-pay

## 2022-06-02 ENCOUNTER — Inpatient Hospital Stay
Admission: RE | Admit: 2022-06-02 | Discharge: 2022-06-05 | DRG: 253 | Disposition: A | Discharge status: Home Health | Payer: Medicare Other | Attending: Vascular Surgery | Admitting: Vascular Surgery

## 2022-06-02 ENCOUNTER — Encounter: Payer: Self-pay | Admitting: Vascular Surgery

## 2022-06-02 ENCOUNTER — Inpatient Hospital Stay: Payer: Medicare Other

## 2022-06-02 ENCOUNTER — Encounter
Admission: RE | Disposition: A | Discharge status: Home Health | Payer: Self-pay | Source: Home / Self Care | Attending: Vascular Surgery

## 2022-06-02 ENCOUNTER — Inpatient Hospital Stay: Payer: Medicare Other | Admitting: Urgent Care

## 2022-06-02 DIAGNOSIS — Z886 Allergy status to analgesic agent status: Secondary | ICD-10-CM

## 2022-06-02 DIAGNOSIS — I252 Old myocardial infarction: Secondary | ICD-10-CM | POA: Diagnosis not present

## 2022-06-02 DIAGNOSIS — E559 Vitamin D deficiency, unspecified: Secondary | ICD-10-CM | POA: Diagnosis present

## 2022-06-02 DIAGNOSIS — L97909 Non-pressure chronic ulcer of unspecified part of unspecified lower leg with unspecified severity: Principal | ICD-10-CM | POA: Diagnosis present

## 2022-06-02 DIAGNOSIS — I70245 Atherosclerosis of native arteries of left leg with ulceration of other part of foot: Secondary | ICD-10-CM | POA: Diagnosis present

## 2022-06-02 DIAGNOSIS — L97529 Non-pressure chronic ulcer of other part of left foot with unspecified severity: Secondary | ICD-10-CM | POA: Diagnosis present

## 2022-06-02 DIAGNOSIS — I701 Atherosclerosis of renal artery: Secondary | ICD-10-CM | POA: Diagnosis present

## 2022-06-02 DIAGNOSIS — Z01812 Encounter for preprocedural laboratory examination: Secondary | ICD-10-CM

## 2022-06-02 DIAGNOSIS — Z888 Allergy status to other drugs, medicaments and biological substances status: Secondary | ICD-10-CM

## 2022-06-02 DIAGNOSIS — I7025 Atherosclerosis of native arteries of other extremities with ulceration: Secondary | ICD-10-CM

## 2022-06-02 DIAGNOSIS — I700 Atherosclerosis of aorta: Secondary | ICD-10-CM | POA: Diagnosis not present

## 2022-06-02 DIAGNOSIS — E11621 Type 2 diabetes mellitus with foot ulcer: Secondary | ICD-10-CM | POA: Diagnosis present

## 2022-06-02 DIAGNOSIS — F419 Anxiety disorder, unspecified: Secondary | ICD-10-CM | POA: Diagnosis present

## 2022-06-02 DIAGNOSIS — Z9981 Dependence on supplemental oxygen: Secondary | ICD-10-CM

## 2022-06-02 DIAGNOSIS — D352 Benign neoplasm of pituitary gland: Secondary | ICD-10-CM | POA: Diagnosis present

## 2022-06-02 DIAGNOSIS — I70222 Atherosclerosis of native arteries of extremities with rest pain, left leg: Secondary | ICD-10-CM | POA: Diagnosis not present

## 2022-06-02 DIAGNOSIS — L97929 Non-pressure chronic ulcer of unspecified part of left lower leg with unspecified severity: Secondary | ICD-10-CM | POA: Diagnosis present

## 2022-06-02 DIAGNOSIS — Z881 Allergy status to other antibiotic agents status: Secondary | ICD-10-CM

## 2022-06-02 DIAGNOSIS — Z90710 Acquired absence of both cervix and uterus: Secondary | ICD-10-CM | POA: Diagnosis not present

## 2022-06-02 DIAGNOSIS — E1151 Type 2 diabetes mellitus with diabetic peripheral angiopathy without gangrene: Secondary | ICD-10-CM | POA: Diagnosis present

## 2022-06-02 DIAGNOSIS — F17210 Nicotine dependence, cigarettes, uncomplicated: Secondary | ICD-10-CM | POA: Diagnosis present

## 2022-06-02 DIAGNOSIS — Z79890 Hormone replacement therapy (postmenopausal): Secondary | ICD-10-CM

## 2022-06-02 DIAGNOSIS — I745 Embolism and thrombosis of iliac artery: Secondary | ICD-10-CM | POA: Diagnosis present

## 2022-06-02 DIAGNOSIS — K219 Gastro-esophageal reflux disease without esophagitis: Secondary | ICD-10-CM | POA: Diagnosis present

## 2022-06-02 DIAGNOSIS — I110 Hypertensive heart disease with heart failure: Secondary | ICD-10-CM | POA: Diagnosis present

## 2022-06-02 DIAGNOSIS — J449 Chronic obstructive pulmonary disease, unspecified: Secondary | ICD-10-CM | POA: Diagnosis present

## 2022-06-02 DIAGNOSIS — I5032 Chronic diastolic (congestive) heart failure: Secondary | ICD-10-CM | POA: Diagnosis present

## 2022-06-02 DIAGNOSIS — E785 Hyperlipidemia, unspecified: Secondary | ICD-10-CM | POA: Diagnosis present

## 2022-06-02 DIAGNOSIS — M48061 Spinal stenosis, lumbar region without neurogenic claudication: Secondary | ICD-10-CM | POA: Diagnosis present

## 2022-06-02 DIAGNOSIS — Z79899 Other long term (current) drug therapy: Secondary | ICD-10-CM

## 2022-06-02 DIAGNOSIS — Z794 Long term (current) use of insulin: Secondary | ICD-10-CM

## 2022-06-02 DIAGNOSIS — I708 Atherosclerosis of other arteries: Secondary | ICD-10-CM | POA: Diagnosis present

## 2022-06-02 DIAGNOSIS — E119 Type 2 diabetes mellitus without complications: Secondary | ICD-10-CM

## 2022-06-02 DIAGNOSIS — Z7982 Long term (current) use of aspirin: Secondary | ICD-10-CM

## 2022-06-02 DIAGNOSIS — Z885 Allergy status to narcotic agent status: Secondary | ICD-10-CM

## 2022-06-02 DIAGNOSIS — I7092 Chronic total occlusion of artery of the extremities: Secondary | ICD-10-CM

## 2022-06-02 DIAGNOSIS — E039 Hypothyroidism, unspecified: Secondary | ICD-10-CM | POA: Diagnosis present

## 2022-06-02 DIAGNOSIS — M1990 Unspecified osteoarthritis, unspecified site: Secondary | ICD-10-CM | POA: Diagnosis present

## 2022-06-02 DIAGNOSIS — Z91041 Radiographic dye allergy status: Secondary | ICD-10-CM

## 2022-06-02 DIAGNOSIS — J9611 Chronic respiratory failure with hypoxia: Secondary | ICD-10-CM | POA: Diagnosis present

## 2022-06-02 DIAGNOSIS — Z7951 Long term (current) use of inhaled steroids: Secondary | ICD-10-CM

## 2022-06-02 DIAGNOSIS — M4807 Spinal stenosis, lumbosacral region: Secondary | ICD-10-CM | POA: Diagnosis present

## 2022-06-02 DIAGNOSIS — E220 Acromegaly and pituitary gigantism: Secondary | ICD-10-CM | POA: Diagnosis present

## 2022-06-02 DIAGNOSIS — Z79891 Long term (current) use of opiate analgesic: Secondary | ICD-10-CM

## 2022-06-02 DIAGNOSIS — I70299 Other atherosclerosis of native arteries of extremities, unspecified extremity: Principal | ICD-10-CM | POA: Diagnosis present

## 2022-06-02 DIAGNOSIS — Z882 Allergy status to sulfonamides status: Secondary | ICD-10-CM

## 2022-06-02 HISTORY — DX: Hyperlipidemia, unspecified: E78.5

## 2022-06-02 HISTORY — PX: ENDARTERECTOMY FEMORAL: SHX5804

## 2022-06-02 HISTORY — DX: Myalgia, unspecified site: M79.10

## 2022-06-02 HISTORY — DX: Long term (current) use of insulin: Z79.4

## 2022-06-02 HISTORY — PX: INSERTION OF ILIAC STENT: SHX6256

## 2022-06-02 HISTORY — DX: Anemia, unspecified: D64.9

## 2022-06-02 HISTORY — DX: Type 2 diabetes mellitus without complications: E11.9

## 2022-06-02 HISTORY — DX: Palpitations: R00.2

## 2022-06-02 HISTORY — DX: Adverse effect of antihyperlipidemic and antiarteriosclerotic drugs, initial encounter: T46.6X5A

## 2022-06-02 HISTORY — DX: Anxiety disorder, unspecified: F41.9

## 2022-06-02 HISTORY — DX: Atherosclerosis of aorta: I70.0

## 2022-06-02 HISTORY — DX: Spinal stenosis, lumbosacral region: M48.07

## 2022-06-02 HISTORY — DX: Long term (current) use of opiate analgesic: Z79.891

## 2022-06-02 HISTORY — DX: Benign neoplasm of pituitary gland: D35.2

## 2022-06-02 HISTORY — DX: Chronic respiratory failure with hypoxia: J96.11

## 2022-06-02 HISTORY — DX: Paresthesia of skin: R20.2

## 2022-06-02 HISTORY — DX: Gastro-esophageal reflux disease without esophagitis: K21.9

## 2022-06-02 HISTORY — DX: Vitamin D deficiency, unspecified: E55.9

## 2022-06-02 HISTORY — DX: Acromegaly and pituitary gigantism: E22.0

## 2022-06-02 HISTORY — DX: Unspecified diastolic (congestive) heart failure: I50.30

## 2022-06-02 LAB — TYPE AND SCREEN
ABO/RH(D): O NEG
Antibody Screen: NEGATIVE

## 2022-06-02 LAB — CREATININE, SERUM
Creatinine, Ser: 1.02 mg/dL — ABNORMAL HIGH (ref 0.44–1.00)
GFR, Estimated: >60 mL/min (ref >60)

## 2022-06-02 LAB — CBC
HCT: 39.1 % (ref 36.0–46.0)
Hemoglobin: 12.9 g/dL (ref 12.0–15.0)
MCH: 30.9 pg (ref 26.0–34.0)
MCHC: 33 g/dL (ref 30.0–36.0)
MCV: 93.5 fL (ref 80.0–100.0)
Platelets: 272 10*3/uL (ref 150–400)
RBC: 4.18 MIL/uL (ref 3.87–5.11)
RDW: 13.7 % (ref 11.5–15.5)
WBC: 13.8 10*3/uL — ABNORMAL HIGH (ref 4.0–10.5)
nRBC: 0 % (ref 0.0–0.2)

## 2022-06-02 LAB — ABO/RH: ABO/RH(D): O NEG

## 2022-06-02 LAB — GLUCOSE, CAPILLARY
Glucose-Capillary: 143 mg/dL — ABNORMAL HIGH (ref 70–99)
Glucose-Capillary: 175 mg/dL — ABNORMAL HIGH (ref 70–99)
Glucose-Capillary: 210 mg/dL — ABNORMAL HIGH (ref 70–99)
Glucose-Capillary: 255 mg/dL — ABNORMAL HIGH (ref 70–99)
Glucose-Capillary: 277 mg/dL — ABNORMAL HIGH (ref 70–99)

## 2022-06-02 LAB — MRSA NEXT GEN BY PCR, NASAL: MRSA by PCR Next Gen: NOT DETECTED

## 2022-06-02 SURGERY — ENDARTERECTOMY, FEMORAL
Anesthesia: General

## 2022-06-02 MED ORDER — PROPOFOL 10 MG/ML IV BOLUS
INTRAVENOUS | Status: AC
  Filled 2022-06-02: qty 20

## 2022-06-02 MED ORDER — SODIUM CHLORIDE 0.9 % IV SOLN: INTRAVENOUS | Status: DC

## 2022-06-02 MED ORDER — PHENYLEPHRINE 80 MCG/ML (10ML) SYRINGE FOR IV PUSH (FOR BLOOD PRESSURE SUPPORT)
PREFILLED_SYRINGE | INTRAVENOUS | Status: DC | PRN
  Administered 2022-06-02 (×2): 40 ug via INTRAVENOUS

## 2022-06-02 MED ORDER — IPRATROPIUM-ALBUTEROL 0.5-2.5 (3) MG/3ML IN SOLN
3.0000 mL | Freq: Four times a day (QID) | RESPIRATORY_TRACT | Status: DC
Start: 2022-06-02 — End: 2022-06-03
  Administered 2022-06-02 – 2022-06-03 (×4): 3 mL via RESPIRATORY_TRACT
  Filled 2022-06-02 (×4): qty 3

## 2022-06-02 MED ORDER — LORATADINE 10 MG PO TABS
10.0000 mg | ORAL_TABLET | Freq: Every day | ORAL | Status: DC
  Administered 2022-06-03 – 2022-06-05 (×3): 10 mg via ORAL
  Filled 2022-06-02 (×3): qty 1

## 2022-06-02 MED ORDER — CHLORHEXIDINE GLUCONATE 0.12 % MT SOLN
OROMUCOSAL | Status: AC
  Administered 2022-06-02: 15 mL via OROMUCOSAL
  Filled 2022-06-02: qty 15

## 2022-06-02 MED ORDER — ONDANSETRON HCL 4 MG/2ML IJ SOLN: 4.0000 mg | Freq: Four times a day (QID) | INTRAMUSCULAR | Status: DC | PRN

## 2022-06-02 MED ORDER — ORAL CARE MOUTH RINSE: 15.0000 mL | OROMUCOSAL | Status: DC | PRN

## 2022-06-02 MED ORDER — HYDROMORPHONE HCL 1 MG/ML IJ SOLN
INTRAMUSCULAR | Status: AC
  Administered 2022-06-02: 0.5 mg via INTRAVENOUS
  Filled 2022-06-02: qty 1

## 2022-06-02 MED ORDER — VANCOMYCIN HCL IN DEXTROSE 1-5 GM/200ML-% IV SOLN
1000.0000 mg | Freq: Two times a day (BID) | INTRAVENOUS | Status: AC
  Administered 2022-06-02 – 2022-06-03 (×2): 1000 mg via INTRAVENOUS
  Filled 2022-06-02 (×2): qty 200

## 2022-06-02 MED ORDER — FAMOTIDINE IN NACL 20-0.9 MG/50ML-% IV SOLN
20.0000 mg | Freq: Two times a day (BID) | INTRAVENOUS | Status: DC
  Administered 2022-06-02 – 2022-06-05 (×6): 20 mg via INTRAVENOUS
  Filled 2022-06-02 (×8): qty 50

## 2022-06-02 MED ORDER — VANCOMYCIN HCL IN DEXTROSE 1-5 GM/200ML-% IV SOLN
1000.0000 mg | INTRAVENOUS | Status: AC
  Administered 2022-06-02: 1000 mg via INTRAVENOUS

## 2022-06-02 MED ORDER — ROCURONIUM BROMIDE 10 MG/ML (PF) SYRINGE
PREFILLED_SYRINGE | INTRAVENOUS | Status: AC
  Filled 2022-06-02: qty 10

## 2022-06-02 MED ORDER — CLOPIDOGREL BISULFATE 75 MG PO TABS
75.0000 mg | ORAL_TABLET | Freq: Every day | ORAL | Status: DC
  Administered 2022-06-03 – 2022-06-05 (×3): 75 mg via ORAL
  Filled 2022-06-02 (×3): qty 1

## 2022-06-02 MED ORDER — DROPERIDOL 2.5 MG/ML IJ SOLN: 0.6250 mg | Freq: Once | INTRAMUSCULAR | Status: DC | PRN

## 2022-06-02 MED ORDER — PHENYLEPHRINE HCL-NACL 20-0.9 MG/250ML-% IV SOLN
INTRAVENOUS | Status: DC | PRN
  Administered 2022-06-02: 30 ug/min via INTRAVENOUS

## 2022-06-02 MED ORDER — ORAL CARE MOUTH RINSE: 15.0000 mL | Freq: Once | OROMUCOSAL | Status: AC

## 2022-06-02 MED ORDER — ACETAMINOPHEN 500 MG PO TABS: 1000.0000 mg | ORAL_TABLET | Freq: Once | ORAL | Status: DC

## 2022-06-02 MED ORDER — HYDROMORPHONE HCL 1 MG/ML IJ SOLN: 0.2500 mg | INTRAMUSCULAR | Status: DC | PRN

## 2022-06-02 MED ORDER — PHENOL 1.4 % MT LIQD: 1.0000 | OROMUCOSAL | Status: DC | PRN

## 2022-06-02 MED ORDER — DOCUSATE SODIUM 100 MG PO CAPS
100.0000 mg | ORAL_CAPSULE | Freq: Every day | ORAL | Status: DC
  Administered 2022-06-03 – 2022-06-05 (×3): 100 mg via ORAL
  Filled 2022-06-02 (×3): qty 1

## 2022-06-02 MED ORDER — ACETAMINOPHEN 325 MG PO TABS: 650.0000 mg | ORAL_TABLET | Freq: Four times a day (QID) | ORAL | Status: DC | PRN

## 2022-06-02 MED ORDER — OCUVITE-LUTEIN PO CAPS
1.0000 | ORAL_CAPSULE | Freq: Every day | ORAL | Status: DC
  Administered 2022-06-03 – 2022-06-05 (×3): 1 via ORAL
  Filled 2022-06-02 (×3): qty 1

## 2022-06-02 MED ORDER — ONDANSETRON HCL 4 MG PO TABS: 4.0000 mg | ORAL_TABLET | Freq: Three times a day (TID) | ORAL | Status: DC | PRN

## 2022-06-02 MED ORDER — CYCLOBENZAPRINE HCL 10 MG PO TABS
10.0000 mg | ORAL_TABLET | Freq: Three times a day (TID) | ORAL | Status: DC | PRN
  Administered 2022-06-03 – 2022-06-04 (×2): 10 mg via ORAL
  Filled 2022-06-02 (×3): qty 1

## 2022-06-02 MED ORDER — HEPARIN SODIUM (PORCINE) 1000 UNIT/ML IJ SOLN: INTRAMUSCULAR | Status: DC | PRN

## 2022-06-02 MED ORDER — MORPHINE SULFATE (PF) 2 MG/ML IV SOLN
2.0000 mg | INTRAVENOUS | Status: DC | PRN
  Administered 2022-06-02 (×2): 2 mg via INTRAVENOUS
  Filled 2022-06-02 (×2): qty 1

## 2022-06-02 MED ORDER — SODIUM CHLORIDE 0.9 % IV SOLN: 500.0000 mL | Freq: Once | INTRAVENOUS | Status: DC | PRN

## 2022-06-02 MED ORDER — LEVOTHYROXINE SODIUM 137 MCG PO TABS
137.0000 ug | ORAL_TABLET | Freq: Every day | ORAL | Status: DC
Start: 2022-06-03 — End: 2022-06-05
  Administered 2022-06-03 – 2022-06-05 (×3): 137 ug via ORAL
  Filled 2022-06-02 (×3): qty 1

## 2022-06-02 MED ORDER — HEPARIN 30,000 UNITS/1000 ML (OHS) CELLSAVER SOLUTION
Status: AC
  Filled 2022-06-02: qty 1000

## 2022-06-02 MED ORDER — LIDOCAINE HCL (PF) 2 % IJ SOLN
INTRAMUSCULAR | Status: AC
  Filled 2022-06-02: qty 5

## 2022-06-02 MED ORDER — ACETAMINOPHEN 325 MG PO TABS
650.0000 mg | ORAL_TABLET | Freq: Once | ORAL | Status: AC
  Administered 2022-06-02: 650 mg via ORAL

## 2022-06-02 MED ORDER — MIDAZOLAM HCL 2 MG/2ML IJ SOLN
INTRAMUSCULAR | Status: AC
  Filled 2022-06-02: qty 2

## 2022-06-02 MED ORDER — LIDOCAINE HCL (CARDIAC) PF 100 MG/5ML IV SOSY
PREFILLED_SYRINGE | INTRAVENOUS | Status: DC | PRN
  Administered 2022-06-02: 100 mg via INTRAVENOUS

## 2022-06-02 MED ORDER — LABETALOL HCL 5 MG/ML IV SOLN: 10.0000 mg | INTRAVENOUS | Status: DC | PRN

## 2022-06-02 MED ORDER — SUGAMMADEX SODIUM 200 MG/2ML IV SOLN
INTRAVENOUS | Status: DC | PRN
  Administered 2022-06-02: 200 mg via INTRAVENOUS

## 2022-06-02 MED ORDER — DOPAMINE-DEXTROSE 3.2-5 MG/ML-% IV SOLN: 3.0000 ug/kg/min | INTRAVENOUS | Status: DC

## 2022-06-02 MED ORDER — ALUM & MAG HYDROXIDE-SIMETH 200-200-20 MG/5ML PO SUSP: 15.0000 mL | ORAL | Status: DC | PRN

## 2022-06-02 MED ORDER — OXYCODONE-ACETAMINOPHEN 5-325 MG PO TABS
2.0000 | ORAL_TABLET | Freq: Four times a day (QID) | ORAL | Status: DC | PRN
  Administered 2022-06-02 – 2022-06-05 (×8): 2 via ORAL
  Filled 2022-06-02 (×8): qty 2

## 2022-06-02 MED ORDER — ROCURONIUM BROMIDE 100 MG/10ML IV SOLN
INTRAVENOUS | Status: DC | PRN
  Administered 2022-06-02: 20 mg via INTRAVENOUS
  Administered 2022-06-02: 50 mg via INTRAVENOUS
  Administered 2022-06-02: 30 mg via INTRAVENOUS
  Administered 2022-06-02: 20 mg via INTRAVENOUS

## 2022-06-02 MED ORDER — FENTANYL CITRATE (PF) 100 MCG/2ML IJ SOLN
INTRAMUSCULAR | Status: DC | PRN
  Administered 2022-06-02 (×2): 50 ug via INTRAVENOUS
  Administered 2022-06-02: 100 ug via INTRAVENOUS
  Administered 2022-06-02: 50 ug via INTRAVENOUS
  Administered 2022-06-02 (×2): 25 ug via INTRAVENOUS

## 2022-06-02 MED ORDER — DIPHENHYDRAMINE HCL 50 MG/ML IJ SOLN
INTRAMUSCULAR | Status: DC | PRN
  Administered 2022-06-02: 50 mg via INTRAVENOUS

## 2022-06-02 MED ORDER — FLUTICASONE FUROATE-VILANTEROL 100-25 MCG/ACT IN AEPB
1.0000 | INHALATION_SPRAY | Freq: Every day | RESPIRATORY_TRACT | Status: DC
Start: 2022-06-02 — End: 2022-06-05
  Administered 2022-06-02 – 2022-06-05 (×4): 1 via RESPIRATORY_TRACT
  Filled 2022-06-02: qty 28

## 2022-06-02 MED ORDER — HEPARIN 5000 UNITS IN NS 1000 ML (FLUSH)
INTRAMUSCULAR | Status: DC | PRN
  Administered 2022-06-02: 200 mL via INTRAMUSCULAR

## 2022-06-02 MED ORDER — CHLORHEXIDINE GLUCONATE 0.12 % MT SOLN: 15.0000 mL | Freq: Once | OROMUCOSAL | Status: AC

## 2022-06-02 MED ORDER — VANCOMYCIN HCL IN DEXTROSE 1-5 GM/200ML-% IV SOLN
INTRAVENOUS | Status: AC
  Filled 2022-06-02: qty 200

## 2022-06-02 MED ORDER — GABAPENTIN 400 MG PO CAPS
800.0000 mg | ORAL_CAPSULE | Freq: Three times a day (TID) | ORAL | Status: DC
  Administered 2022-06-02 – 2022-06-05 (×7): 800 mg via ORAL
  Filled 2022-06-02 (×8): qty 2

## 2022-06-02 MED ORDER — METOPROLOL TARTRATE 5 MG/5ML IV SOLN: 2.0000 mg | INTRAVENOUS | Status: DC | PRN

## 2022-06-02 MED ORDER — POTASSIUM CHLORIDE CRYS ER 20 MEQ PO TBCR: 20.0000 meq | EXTENDED_RELEASE_TABLET | Freq: Every day | ORAL | Status: DC | PRN

## 2022-06-02 MED ORDER — MAGNESIUM SULFATE 2 GM/50ML IV SOLN: 2.0000 g | Freq: Every day | INTRAVENOUS | Status: DC | PRN

## 2022-06-02 MED ORDER — MIDAZOLAM HCL 2 MG/2ML IJ SOLN
INTRAMUSCULAR | Status: DC | PRN
  Administered 2022-06-02: .5 mg via INTRAVENOUS

## 2022-06-02 MED ORDER — FENTANYL CITRATE (PF) 100 MCG/2ML IJ SOLN
INTRAMUSCULAR | Status: AC
  Filled 2022-06-02: qty 2

## 2022-06-02 MED ORDER — ONDANSETRON HCL 4 MG/2ML IJ SOLN
INTRAMUSCULAR | Status: AC
  Filled 2022-06-02: qty 2

## 2022-06-02 MED ORDER — SORBITOL 70 % SOLN: 30.0000 mL | Freq: Every day | Status: DC | PRN

## 2022-06-02 MED ORDER — VITAMIN D (ERGOCALCIFEROL) 1.25 MG (50000 UNIT) PO CAPS
50000.0000 [IU] | ORAL_CAPSULE | ORAL | Status: DC
Start: 2022-06-03 — End: 2022-06-05
  Administered 2022-06-03: 50000 [IU] via ORAL
  Filled 2022-06-02: qty 1

## 2022-06-02 MED ORDER — FUROSEMIDE 40 MG PO TABS
40.0000 mg | ORAL_TABLET | Freq: Every day | ORAL | Status: DC
  Administered 2022-06-02 – 2022-06-05 (×4): 40 mg via ORAL
  Filled 2022-06-02: qty 2
  Filled 2022-06-02: qty 1
  Filled 2022-06-02 (×2): qty 2

## 2022-06-02 MED ORDER — IPRATROPIUM BROMIDE HFA 17 MCG/ACT IN AERS
2.0000 | INHALATION_SPRAY | Freq: Four times a day (QID) | RESPIRATORY_TRACT | Status: DC
Start: 2022-06-02 — End: 2022-06-02

## 2022-06-02 MED ORDER — EZETIMIBE 10 MG PO TABS
10.0000 mg | ORAL_TABLET | Freq: Every day | ORAL | Status: DC
  Administered 2022-06-02 – 2022-06-05 (×4): 10 mg via ORAL
  Filled 2022-06-02 (×4): qty 1

## 2022-06-02 MED ORDER — INSULIN GLARGINE-YFGN 100 UNIT/ML ~~LOC~~ SOLN
35.0000 [IU] | Freq: Every day | SUBCUTANEOUS | Status: DC
  Administered 2022-06-02 – 2022-06-04 (×3): 35 [IU] via SUBCUTANEOUS
  Filled 2022-06-02 (×4): qty 0.35

## 2022-06-02 MED ORDER — INSULIN ASPART 100 UNIT/ML IJ SOLN
0.0000 [IU] | Freq: Three times a day (TID) | INTRAMUSCULAR | Status: DC
  Administered 2022-06-03: 2 [IU] via SUBCUTANEOUS
  Administered 2022-06-03 – 2022-06-04 (×3): 3 [IU] via SUBCUTANEOUS
  Filled 2022-06-02 (×4): qty 1

## 2022-06-02 MED ORDER — HEPARIN SODIUM (PORCINE) 1000 UNIT/ML IJ SOLN
INTRAMUSCULAR | Status: DC | PRN
  Administered 2022-06-02: 5000 [IU] via INTRAVENOUS
  Administered 2022-06-02: 2000 [IU] via INTRAVENOUS

## 2022-06-02 MED ORDER — ONDANSETRON HCL 4 MG/2ML IJ SOLN
INTRAMUSCULAR | Status: DC | PRN
  Administered 2022-06-02: 4 mg via INTRAVENOUS

## 2022-06-02 MED ORDER — HEMOSTATIC AGENTS (NO CHARGE) OPTIME
TOPICAL | Status: DC | PRN
  Administered 2022-06-02: 1 via TOPICAL

## 2022-06-02 MED ORDER — HYDRALAZINE HCL 20 MG/ML IJ SOLN: 5.0000 mg | INTRAMUSCULAR | Status: DC | PRN

## 2022-06-02 MED ORDER — ASPIRIN 81 MG PO TBEC
81.0000 mg | DELAYED_RELEASE_TABLET | Freq: Every day | ORAL | Status: DC
  Administered 2022-06-03 – 2022-06-05 (×3): 81 mg via ORAL
  Filled 2022-06-02 (×3): qty 1

## 2022-06-02 MED ORDER — CYANOCOBALAMIN 500 MCG PO TABS
500.0000 ug | ORAL_TABLET | Freq: Every day | ORAL | Status: DC
  Administered 2022-06-03 – 2022-06-05 (×3): 500 ug via ORAL
  Filled 2022-06-02 (×3): qty 1

## 2022-06-02 MED ORDER — ENOXAPARIN SODIUM 40 MG/0.4ML IJ SOSY
40.0000 mg | PREFILLED_SYRINGE | INTRAMUSCULAR | Status: DC
  Administered 2022-06-03 – 2022-06-05 (×3): 40 mg via SUBCUTANEOUS
  Filled 2022-06-02 (×3): qty 0.4

## 2022-06-02 MED ORDER — "VISTASEAL 4 ML SINGLE DOSE KIT "
PACK | CUTANEOUS | Status: DC | PRN
  Administered 2022-06-02: 4 mL via TOPICAL

## 2022-06-02 MED ORDER — HYDROMORPHONE HCL 1 MG/ML IJ SOLN
1.0000 mg | Freq: Once | INTRAMUSCULAR | Status: AC | PRN
  Administered 2022-06-02: 0.25 mg via INTRAVENOUS

## 2022-06-02 MED ORDER — PROPOFOL 10 MG/ML IV BOLUS
INTRAVENOUS | Status: DC | PRN
  Administered 2022-06-02: 100 mg via INTRAVENOUS
  Administered 2022-06-02: 50 mg via INTRAVENOUS

## 2022-06-02 MED ORDER — PREDNISONE 10 MG PO TABS
10.0000 mg | ORAL_TABLET | Freq: Every day | ORAL | Status: DC
  Administered 2022-06-03 – 2022-06-05 (×3): 10 mg via ORAL
  Filled 2022-06-02 (×3): qty 1

## 2022-06-02 MED ORDER — ACETAMINOPHEN 325 MG PO TABS
ORAL_TABLET | ORAL | Status: AC
  Filled 2022-06-02: qty 2

## 2022-06-02 MED ORDER — SPIRONOLACTONE 25 MG PO TABS
25.0000 mg | ORAL_TABLET | Freq: Every day | ORAL | Status: DC
  Administered 2022-06-03 – 2022-06-05 (×3): 25 mg via ORAL
  Filled 2022-06-02 (×3): qty 1

## 2022-06-02 MED ORDER — METHYLPREDNISOLONE SODIUM SUCC 125 MG IJ SOLR
INTRAMUSCULAR | Status: DC | PRN
  Administered 2022-06-02: 125 mg via INTRAVENOUS

## 2022-06-02 MED ORDER — GUAIFENESIN-DM 100-10 MG/5ML PO SYRP: 15.0000 mL | ORAL_SOLUTION | ORAL | Status: DC | PRN

## 2022-06-02 MED ORDER — HEPARIN SODIUM (PORCINE) 5000 UNIT/ML IJ SOLN
INTRAMUSCULAR | Status: AC
  Filled 2022-06-02: qty 1

## 2022-06-02 MED ORDER — VENLAFAXINE HCL ER 75 MG PO CP24
75.0000 mg | ORAL_CAPSULE | Freq: Every day | ORAL | Status: DC
  Administered 2022-06-03 – 2022-06-05 (×3): 75 mg via ORAL
  Filled 2022-06-02 (×4): qty 1

## 2022-06-02 MED ORDER — ATORVASTATIN CALCIUM 10 MG PO TABS
10.0000 mg | ORAL_TABLET | Freq: Every day | ORAL | Status: DC
  Administered 2022-06-03 – 2022-06-05 (×3): 10 mg via ORAL
  Filled 2022-06-02 (×3): qty 1

## 2022-06-02 MED ORDER — DEXAMETHASONE SODIUM PHOSPHATE 10 MG/ML IJ SOLN
INTRAMUSCULAR | Status: AC
  Filled 2022-06-02: qty 1

## 2022-06-02 MED ORDER — PHENYLEPHRINE HCL (PRESSORS) 10 MG/ML IV SOLN
INTRAVENOUS | Status: DC | PRN
  Administered 2022-06-02: 80 ug via INTRAVENOUS

## 2022-06-02 MED ORDER — CHLORHEXIDINE GLUCONATE CLOTH 2 % EX PADS
6.0000 | MEDICATED_PAD | Freq: Every day | CUTANEOUS | Status: DC
Start: 2022-06-03 — End: 2022-06-05

## 2022-06-02 MED ORDER — ASPIRIN 81 MG PO CHEW
81.0000 mg | CHEWABLE_TABLET | Freq: Every day | ORAL | Status: DC
Start: 2022-06-02 — End: 2022-06-02

## 2022-06-02 MED ORDER — SENNOSIDES-DOCUSATE SODIUM 8.6-50 MG PO TABS: 1.0000 | ORAL_TABLET | Freq: Every evening | ORAL | Status: DC | PRN

## 2022-06-02 MED ORDER — EPHEDRINE SULFATE (PRESSORS) 50 MG/ML IJ SOLN
INTRAMUSCULAR | Status: DC | PRN
  Administered 2022-06-02: 10 mg via INTRAVENOUS

## 2022-06-02 MED ORDER — PHENYLEPHRINE HCL-NACL 20-0.9 MG/250ML-% IV SOLN
INTRAVENOUS | Status: AC
  Filled 2022-06-02: qty 250

## 2022-06-02 MED ORDER — ESTROGENS CONJUGATED 0.625 MG/GM VA CREA
0.5000 g | TOPICAL_CREAM | VAGINAL | Status: DC
Start: 2022-06-03 — End: 2022-06-05
  Filled 2022-06-02: qty 30

## 2022-06-02 MED ORDER — ALBUTEROL SULFATE (2.5 MG/3ML) 0.083% IN NEBU
3.0000 mL | INHALATION_SOLUTION | Freq: Four times a day (QID) | RESPIRATORY_TRACT | Status: DC | PRN
  Administered 2022-06-02 – 2022-06-05 (×6): 3 mL via RESPIRATORY_TRACT
  Filled 2022-06-02 (×6): qty 3

## 2022-06-02 MED ORDER — DIPHENHYDRAMINE HCL 50 MG/ML IJ SOLN
INTRAMUSCULAR | Status: AC
  Filled 2022-06-02: qty 1

## 2022-06-02 MED ORDER — EPHEDRINE 5 MG/ML INJ
INTRAVENOUS | Status: AC
  Filled 2022-06-02: qty 5

## 2022-06-02 MED ORDER — UMECLIDINIUM BROMIDE 62.5 MCG/ACT IN AEPB
1.0000 | INHALATION_SPRAY | Freq: Every day | RESPIRATORY_TRACT | Status: DC
  Administered 2022-06-05: 1 via RESPIRATORY_TRACT
  Filled 2022-06-02: qty 7

## 2022-06-02 MED ORDER — NITROGLYCERIN IN D5W 200-5 MCG/ML-% IV SOLN: 5.0000 ug/min | INTRAVENOUS | Status: DC

## 2022-06-02 MED ORDER — PROMETHAZINE HCL 25 MG/ML IJ SOLN: 6.2500 mg | INTRAMUSCULAR | Status: DC | PRN

## 2022-06-02 MED ORDER — PANTOPRAZOLE SODIUM 40 MG PO TBEC
40.0000 mg | DELAYED_RELEASE_TABLET | Freq: Every day | ORAL | Status: DC
  Administered 2022-06-03 – 2022-06-05 (×3): 40 mg via ORAL
  Filled 2022-06-02 (×3): qty 1

## 2022-06-02 SURGICAL SUPPLY — 87 items
BAG DECANTER FOR FLEXI CONT (MISCELLANEOUS) ×4 IMPLANT
BALLN LUTONIX 7X80X130 (BALLOONS) ×3
BALLN LUTONIX DCB 6X60X130 (BALLOONS) ×3
BALLOON LUTONIX 7X80X130 (BALLOONS) IMPLANT
BALLOON LUTONIX DCB 6X60X130 (BALLOONS) IMPLANT
BLADE SURG 15 STRL LF DISP TIS (BLADE) ×3 IMPLANT
BLADE SURG 15 STRL SS (BLADE) ×4
BLADE SURG SZ11 CARB STEEL (BLADE) ×4 IMPLANT
BOOT SUTURE AID YELLOW STND (SUTURE) ×4 IMPLANT
BRUSH SCRUB EZ  4% CHG (MISCELLANEOUS) ×4
BRUSH SCRUB EZ 4% CHG (MISCELLANEOUS) ×3 IMPLANT
CANNULA 5F STIFF (CANNULA) IMPLANT
CATH BEACON 5 .035 40 KMP TP (CATHETERS) IMPLANT
CATH BEACON 5 .038 40 KMP TP (CATHETERS) ×9
CHLORAPREP W/TINT 26 (MISCELLANEOUS) ×4 IMPLANT
COVER PROBE FLX POLY STRL (MISCELLANEOUS) ×1 IMPLANT
DERMABOND ADVANCED (GAUZE/BANDAGES/DRESSINGS) ×1
DERMABOND ADVANCED .7 DNX12 (GAUZE/BANDAGES/DRESSINGS) ×3 IMPLANT
DEVICE STARCLOSE SE CLOSURE (Vascular Products) IMPLANT
DRAPE C-ARM XRAY 36X54 (DRAPES) ×4 IMPLANT
DRAPE INCISE IOBAN 66X45 STRL (DRAPES) ×4 IMPLANT
DRESSING PEEL AND PLC PRVNA 13 (GAUZE/BANDAGES/DRESSINGS) IMPLANT
DRSG OPSITE POSTOP 4X6 (GAUZE/BANDAGES/DRESSINGS) IMPLANT
DRSG PEEL AND PLACE PREVENA 13 (GAUZE/BANDAGES/DRESSINGS) ×4
DRSG TEGADERM 2-3/8X2-3/4 SM (GAUZE/BANDAGES/DRESSINGS) ×1 IMPLANT
ELECT CAUTERY BLADE 6.4 (BLADE) ×4 IMPLANT
ELECT REM PT RETURN 9FT ADLT (ELECTROSURGICAL) ×4
ELECTRODE REM PT RTRN 9FT ADLT (ELECTROSURGICAL) ×3 IMPLANT
ENSNARE 9-15 (MISCELLANEOUS) IMPLANT
GAUZE 4X4 16PLY ~~LOC~~+RFID DBL (SPONGE) ×4 IMPLANT
GLIDEWIRE ADV .035X180CM (WIRE) IMPLANT
GLOVE BIO SURGEON STRL SZ7 (GLOVE) ×8 IMPLANT
GOWN STRL REUS W/ TWL LRG LVL3 (GOWN DISPOSABLE) ×3 IMPLANT
GOWN STRL REUS W/ TWL XL LVL3 (GOWN DISPOSABLE) ×6 IMPLANT
GOWN STRL REUS W/TWL LRG LVL3 (GOWN DISPOSABLE) ×4
GOWN STRL REUS W/TWL XL LVL3 (GOWN DISPOSABLE) ×8
GUIDEWIRE ADV .018X180CM (WIRE) IMPLANT
HEMOSTAT SURGICEL 2X3 (HEMOSTASIS) ×4 IMPLANT
INTRODUCER 7FR 23CM (INTRODUCER) IMPLANT
IV NS 500ML (IV SOLUTION) ×4
IV NS 500ML BAXH (IV SOLUTION) ×3 IMPLANT
KIT ENCORE 26 ADVANTAGE (KITS) IMPLANT
KIT TURNOVER KIT A (KITS) ×4 IMPLANT
LABEL OR SOLS (LABEL) ×4 IMPLANT
LOOP RED MAXI  1X406MM (MISCELLANEOUS) ×2
LOOP VESSEL MAXI  1X406 RED (MISCELLANEOUS) ×6
LOOP VESSEL MAXI 1X406 RED (MISCELLANEOUS) ×6 IMPLANT
LOOP VESSEL MINI 0.8X406 BLUE (MISCELLANEOUS) ×6 IMPLANT
LOOPS BLUE MINI 0.8X406MM (MISCELLANEOUS) ×2
MANIFOLD NEPTUNE II (INSTRUMENTS) ×4 IMPLANT
NDL SAFETY ECLIPSE 18X1.5 (NEEDLE) ×3 IMPLANT
NEEDLE HYPO 18GX1.5 SHARP (NEEDLE) ×4
NS IRRIG 500ML POUR BTL (IV SOLUTION) ×4 IMPLANT
PACK ANGIOGRAPHY (CUSTOM PROCEDURE TRAY) IMPLANT
PACK BASIN MAJOR ARMC (MISCELLANEOUS) ×4 IMPLANT
PACK UNIVERSAL (MISCELLANEOUS) ×4 IMPLANT
PATCH CAROTID ECM VASC 1X10 (Prosthesis & Implant Heart) ×1 IMPLANT
SET WALTER ACTIVATION W/DRAPE (SET/KITS/TRAYS/PACK) ×4 IMPLANT
SHEATH BRITE TIP 5FRX11 (SHEATH) IMPLANT
SHEATH BRITE TIP 7FRX11 (SHEATH) IMPLANT
SPONGE GAUZE 2X2 8PLY STRL LF (GAUZE/BANDAGES/DRESSINGS) ×1 IMPLANT
SPONGE T-LAP 18X18 ~~LOC~~+RFID (SPONGE) ×8 IMPLANT
STENT LIFESTREAM 8X58X80 (Permanent Stent) ×3 IMPLANT
STENT VIABAHN 8X7.5X120 (Permanent Stent) ×1 IMPLANT
SUT MNCRL 4-0 (SUTURE) ×4
SUT MNCRL 4-0 27XMFL (SUTURE) ×3
SUT PROLENE 5 0 RB 1 DA (SUTURE) ×8 IMPLANT
SUT PROLENE 6 0 BV (SUTURE) ×16 IMPLANT
SUT PROLENE 7 0 BV 1 (SUTURE) ×8 IMPLANT
SUT SILK 2 0 (SUTURE) ×4
SUT SILK 2-0 18XBRD TIE 12 (SUTURE) ×3 IMPLANT
SUT SILK 3 0 (SUTURE) ×4
SUT SILK 3-0 18XBRD TIE 12 (SUTURE) ×3 IMPLANT
SUT SILK 4 0 (SUTURE) ×4
SUT SILK 4-0 18XBRD TIE 12 (SUTURE) ×3 IMPLANT
SUT VIC AB 2-0 CT1 27 (SUTURE) ×8
SUT VIC AB 2-0 CT1 TAPERPNT 27 (SUTURE) ×6 IMPLANT
SUT VIC AB 3-0 SH 27 (SUTURE) ×4
SUT VIC AB 3-0 SH 27X BRD (SUTURE) ×3 IMPLANT
SUT VICRYL+ 3-0 36IN CT-1 (SUTURE) ×8 IMPLANT
SUTURE MNCRL 4-0 27XMF (SUTURE) ×3 IMPLANT
SYR 20ML LL LF (SYRINGE) ×4 IMPLANT
SYR 5ML LL (SYRINGE) ×4 IMPLANT
TRAY FOLEY MTR SLVR 16FR STAT (SET/KITS/TRAYS/PACK) ×4 IMPLANT
WATER STERILE IRR 500ML POUR (IV SOLUTION) ×4 IMPLANT
WIRE G 018X200 V18 (WIRE) IMPLANT
WIRE GUIDERIGHT .035X150 (WIRE) IMPLANT

## 2022-06-02 NOTE — Anesthesia Preprocedure Evaluation (Addendum)
Anesthesia Evaluation  Patient identified by MRN, date of birth, ID band Patient awake    Reviewed: Allergy & Precautions, NPO status , Patient's Chart, lab work & pertinent test results  Airway Mallampati: I  TM Distance: >3 FB Neck ROM: full    Dental  (+) Edentulous Lower   Pulmonary shortness of breath and with exertion, COPD,  COPD inhaler and oxygen dependent, Current Smoker and Patient abstained from smoking.,  chronic supplemental oxygen at 2L/[place]   Pulmonary exam normal        Cardiovascular hypertension, Pt. on medications + Past MI (Type 2 MI 2021 ), + Peripheral Vascular Disease and +CHF (HFpEF)  Normal cardiovascular exam  TTE 12/16/2021: EF >55%, mod LVD, G1DD.   Neuro/Psych PSYCHIATRIC DISORDERS Anxiety Chronic Pain-  TD fentanyl + oxy/APAPPeripheral neuropathy left foot  Neuromuscular disease    GI/Hepatic Neg liver ROS, GERD  Controlled and Medicated,  Endo/Other  diabetes, Well Controlled, Type 2Hypothyroidism [place], Pituitary adenoma   Renal/GU      Musculoskeletal  (+) Arthritis , Lumbosacral spinal stenosis   Abdominal Normal abdominal exam  (+)   Peds  Hematology negative hematology ROS (+)   Anesthesia Other Findings Past Medical History: No date: (HFpEF) heart failure with preserved ejection fraction ([place])     Comment:  a.) TTE 03/17/2020: EF >55%, no RWMAs, norm RVSF; b.)               TTE 12/19/2020: EF >55%, mild LVH, mild RAE, mild MAC,               triv MR/TR/PR, G1DD; c.) TTE 12/16/2021: EF >55%, mod               LVD, G1DD. No date: [place] [facility])     Comment:  a.) on cabergoline No date: Anemia No date: Anxiety No date: Aortic atherosclerosis (HCC) No date: Arthritis No date: Chronic prescription opiate use     Comment:  a.) TD fentanyl + oxy/APAP; b.) has naloxone Rx No date: Chronic respiratory failure with hypoxia, on home oxygen  therapy (HCC)     Comment:  a.)  chronic supplemental oxygen at 2L/[place] No date: COPD (chronic obstructive pulmonary disease) (HCC) No date: GERD (gastroesophageal reflux disease) No date: HLD (hyperlipidemia) No date: Hypertension No date: Hypothyroidism No date: Lumbosacral spinal stenosis No date: Myalgia due to statin 05/06/2022: PAD (peripheral artery disease) ([place])     Comment:  a.) PVC 05/06/2022 --> 60-70% RCIA, occlusion LCIA, 50%               RFA No date: Palpitations No date: Paresthesias (hands) No date: Pituitary adenoma ([place]) No date: Tobacco abuse No date: Type 2 diabetes mellitus treated with insulin ([place]) 10/31/2020: Type 2 MI (myocardial infarction) ([place])     Comment:  a.) troponins 102 --> 304 --> 458; etiology felt to be               secondary to AECOPD No date: Vitamin D deficiency  Past Surgical History: 1989: ABDOMINAL HYSTERECTOMY No date: CHOLECYSTECTOMY 05/06/2022: LOWER EXTREMITY ANGIOGRAPHY; Left     Comment:  Procedure: Lower Extremity Angiography;  Surgeon: Algernon Huxley, MD;  Location: [facility];  Service:               Cardiovascular;  Laterality: Left; 2002: NECK SURGERY 1983: OVARIAN CYST SURGERY; Left 1980: TIBIA FRACTURE SURGERY; Left  BMI  Body Mass Index: 27.50 kg/m      Reproductive/Obstetrics negative OB ROS                           Anesthesia Physical Anesthesia Plan  ASA: 3  Anesthesia Plan: General ETT   Post-op Pain Management: Tylenol PO (pre-op)*, Regional block* and Ketamine IV*   Induction: Intravenous  PONV Risk Score and Plan: Ondansetron, Dexamethasone and Midazolam  Airway Management Planned: Oral ETT  Additional Equipment: Arterial line  Intra-op Plan:   Post-operative Plan: Extubation in OR  Informed Consent: I have reviewed the patients History and Physical, chart, labs and discussed the procedure including the risks, benefits and alternatives for the proposed anesthesia with the  patient or authorized representative who has indicated his/her understanding and acceptance.     Dental Advisory Given  Plan Discussed with: Anesthesiologist, CRNA and Surgeon  Anesthesia Plan Comments:        Anesthesia Quick Evaluation

## 2022-06-02 NOTE — Transfer of Care (Signed)
Immediate Anesthesia Transfer of Care Note  Patient: Alison Taylor  Procedure(s) Performed: ENDARTERECTOMY FEMORAL (Left) INSERTION OF ILIAC STENT (Bilateral) APPLICATION OF CELL SAVER  Patient Location: PACU  Anesthesia Type:General  Level of Consciousness: drowsy  Airway & Oxygen Therapy: Patient Spontanous Breathing and Patient connected to face mask oxygen  Post-op Assessment: Report given to RN  Post vital signs: stable  Last Vitals:  Vitals Value Taken Time  BP 131/56 06/02/22 1515  Temp    Pulse 85 06/02/22 1517  Resp 18 06/02/22 1517  SpO2 99 % 06/02/22 1517  Vitals shown include unvalidated device data.  Last Pain:  Vitals:   06/02/22 0917  TempSrc: Temporal  PainSc: 0-No pain         Complications: No notable events documented.

## 2022-06-02 NOTE — Op Note (Signed)
OPERATIVE NOTE   PROCEDURE: Left common femoral, superficial femoral and profunda femoris endarterectomy with Cormatrix patch angioplasty. Introduction catheter into right external iliac artery from left common femoral approach second-order catheter placement. Introduction catheter into aorta from right common femoral artery approach. Ultrasound-guided access to right common femoral. Percutaneous transluminal angioplasty and stent placement using a and 8 x 58 Lifestream stent right common iliac artery kissing balloon technique. Open angioplasty and stent placement left common iliac artery using two 8 x 58 Lifestream stents (kissing balloon technique for the proximal stent). Percutaneous transluminal angioplasty of the right external iliac artery to 6 mm with a Lutonix drug-eluting balloon. Percutaneous transluminal angioplasty and stent placement using a 8 mm x 75 mm Viabahn stent postdilated to 7 mm  PRE-OPERATIVE DIAGNOSIS: Atherosclerotic occlusive disease left Taylor extremity with ulcerations and mild rest pain symptoms  POST-OPERATIVE DIAGNOSIS: Same  CO-SURGEON: Katha Cabal, MD and Algernon Huxley, M.D.  ASSISTANT(S): None  ANESTHESIA: general  ESTIMATED BLOOD LOSS: 100 cc  FINDING(S): Profound calcific plaque noted of the left common femoral extending past the initial bifurcation of the profunda femoris arteries as well as down the extensive length of the SFA  SPECIMEN(S):  Calcific plaque from the common femoral, superficial femoral and the profunda femoris artery  INDICATIONS:   Alison Taylor 66 y.o. y.o.female who presents with complaints of lifestyle limiting claudication and pain continuously in the left Taylor extremity. The patient has documented severe atherosclerotic occlusive disease and has undergone minimally invasive treatments in the past. However, at this point his primary area of stricture stenosis resides in the  common femoral and origins of the superficial femoral and profunda femoris extending into these arteries and therefore this is not amenable to intervention. The patient is therefore undergoing open endarterectomy. The risks and benefits of surgery have been reviewed with the patient, all questions have answered; alternative therapies have been reviewed as well and the patient has agreed to proceed with surgical open repair.  DESCRIPTION: After obtaining full informed written consent, the patient was brought back to the operating room and placed supine upon the operating table.  The patient received IV antibiotics prior to induction.  After obtaining adequate anesthesia, the patient was prepped and draped in the standard fashion for left femoral exposure.  Co-surgeons are required because this is a complicated procedure with work being performed simultaneously from both the patient's right left sides.  This also expedites the procedure making a shorter operative time reducing complications and improving patient safety.  Attention was turned to the left groin with Dr. Lucky Cowboy working on the patient's left and myself working on the right of the patient.  Vertical  Incision was made over the left common femoral artery and dissection carried down to the common femoral artery with electrocautery.  I dissected out the common femoral artery from the distal external iliac artery (identified by the superficial circumflex vessels) down to the femoral bifurcation.  On initial inspection, the common femoral artery was: densely calcified and there was no palpable pulse noted.    Subsequently the dissection was continued to include all circumflex branches and the profunda femoral artery and superficial femoral artery. The superficial femoral artery was dissected circumferentially for a distance of approximately 3-4 cm and the profunda femoris was dissected circumferentially out to the fourth order branches individual vessel loops  were placed around each branch.  Control of all branches was obtained with vessel loops.  A softer area in the distal external iliac  artery amendable to clamping was identified.    The patient was given 5000 units of Heparin intravenously, which was a therapeutic bolus.   After waiting 5 minutes, the distal external iliac artery was clamped and all of the vessel loops were placed under tension.  Arteriotomy was made in the common femoral artery with a 11-blade and extended it with a Potts scissor proximally and distally extending the distal end down the SFA for approximately 3 cm.   Endarterectomy was then performed under direct visualization using a freer elevator and a right angle from the mid common femoral extending up both proximally and distally. Proximally the endarterectomy was brought up to the level of the clamp where a clean edge was obtained. Distally the endarterectomy was carried down to a soft spot in the SFA where a feathered edge would was obtained.  7-0 Prolene interrupted tacking sutures were placed to secure the leading edge of the plaque in the SFA.  The profunda femoris was treated with an eversion technique extending endarterectomy approximately 2 cm distally again obtaining a featheredge on both sides right and left.   At this point, a corematrix patch was fashioned for the geometry of the arteriotomy.  The patch was sewn to the artery with 2 running stitches of 6-0 Prolene, running from each end.  Prior to completing the patch angioplasty, the profunda femoral artery was flushed as was the superficial femoral artery. The system was then forward flushed. The endarterectomy site was then irrigated copiously with heparinized saline. The patch angioplasty was completed in the usual fashion.  Flow was then reestablished first to the profunda femoris and then the superficial femoral artery. Any gaps or bleeding sites in the suture line were easily controlled with a 6-0 Prolene suture.    At this point we transition to the interventional portion of the case.  Ultrasound was delivered to the sterile field in a sterile sleeve and the right common femoral artery was imaged.  It was echolucent and pulsatile indicating patency.  Images recorded for the permanent record.  Under direct ultrasound visualization a microneedle was inserted followed by of the microwire and then the micro sheath.  A 7 French sheath was then placed over a J-wire.  Wire and Kumpe catheter were then negotiated into the aorta and hand-injection contrast demonstrated the distal aortic anatomy as well as the anatomy of the iliac arteries.  This initial image showed a greater than 70% stenosis at the origin of the right common iliac artery with plaque that extended distally about two thirds of the length of the common iliac artery.  Also the distal aorta at the level of the bifurcation appeared to have approximately 60% stenosis.  The left common iliac artery and external iliac artery appeared occluded.  Next a 7 [place] sheath was started by puncturing the patch with a Seldinger needle J-wire was advanced followed by placement of a 7 French sheath.  With Dr. Lucky Cowboy working from the left side using a Kumpe and a advantage wire was able to negotiate the wire and catheter into the true lumen of the right common iliac and then advanced this down to just above the sheath on the right.  I was then able to snare the wire and pulled the wire extracorporeally.  We then exchanged the 11 cm 7 French sheath for a 25 cm 7 French sheath advancing it over both wires up into the distal aorta where with the Kumpe catheter and working from the left groin we  were able to flip the wire up into the true lumen.  Hand-injection of contrast through the Kumpe catheter then verified that we were intraluminal.  Dr. Lucky Cowboy then advanced a 25 cm 7 French sheath up his side as well.  Magnified imaging with hand-injection through the sheaths provided anatomy for  reconstructing the aortic bifurcation and common iliac arteries bilaterally using the kissing balloon technique.  Initially an 8 mm x 58 mm lifestream stent was selected for both sides they were deployed simultaneously raising the aortic bifurcation by approximately 10 to 15 mm.  We then performed a hand-injection on the right in the LAO projection and noticed a greater than 65% stenosis of the proximal right external iliac artery.  A 6 mm x 60 mm Lutonix drug-eluting balloon was advanced up across this lesion and inflated to 10 atm for 1 minute.  Follow-up imaging demonstrated less than 10% residual stenosis throughout the right iliac system from the common iliac through the external.  Attention was then turned to completing the reconstruction on the left a second 8 mm x 58 mm lifestream stent was then deployed extending down likely to the proximal external iliac artery.  An 8 mm x 75 mm Viabahn stent was then deployed covering the length of the left external iliac artery.  A wire exchange had been performed prior to deploying the Viabahn stent and the stent itself was post dilated to fully expanded using a 7 mm x 80 mm Lutonix drug-eluting balloon.  Catheter was then reintroduced into the aorta and imaging was obtained demonstrating the distal aorta and bilateral common iliac arteries are now widely patent with less than 10% residual stenosis the external iliac arteries are now widely patent with less than 10% residual stenosis.  The patch reconstruction was also imaged demonstrating wide patency of the common femoral and distal runoff was then obtained demonstrating the SFA popliteal was widely patent as is the profunda femoris.  The trifurcation is widely patent there appears to be two-vessel runoff to the foot.  At this point we elected to complete the case.  The right groin was treated by taking an oblique image and then deploying a StarClose without difficulty  The left groin was then irrigated copiously  with sterile saline and subsequently Evicel and Surgicel were placed in the wound. The incision was repaired with a double layer of 2-0 Vicryl, a double layer of 3-0 Vicryl, and a layer of 4-0 Monocryl in a subcuticular fashion.  The skin was cleaned, dried, and reinforced with Dermabond.  COMPLICATIONS: None  CONDITION: Alison Taylor, M.D. [facility] Office: [phone]  06/02/2022, 3:39 PM

## 2022-06-02 NOTE — Interval H&P Note (Signed)
History and Physical Interval Note:  06/02/2022 10:33 AM  Alison Taylor  has presented today for surgery, with the diagnosis of ASO WITH ULCERATION.  The various methods of treatment have been discussed with the patient and family. After consideration of risks, benefits and other options for treatment, the patient has consented to  Procedure(s): ENDARTERECTOMY FEMORAL (Left) INSERTION OF ILIAC STENT (Bilateral) APPLICATION OF CELL SAVER (N/A) as a surgical intervention.  The patient's history has been reviewed, patient examined, no change in status, stable for surgery.  I have reviewed the patient's chart and labs.  Questions were answered to the patient's satisfaction.     [name]

## 2022-06-02 NOTE — Op Note (Signed)
OPERATIVE NOTE   PROCEDURE: 1.   Left common femoral, profunda femoris, and superficial femoral artery endarterectomies and patch angioplasty 2.   Catheter placement into right external iliac artery from left femoral approach and into the aorta from right femoral approach 3.   Aortogram and selective left lower extremity angiogram 4.  Kissing balloon expandable stent placements to bilateral common iliac arteries in the distal aorta with 8 mm diameter by 58 mm length lifestream stents bilaterally 5.  Angioplasty of the right external iliac artery with 6 mm diameter by 6 cm length Lutonix drug-coated angioplasty balloon 6.  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  significant plaque in left common femoral, profunda femoris, and superficial femoral arteries 2.  Significant aortoiliac disease with occlusion on the left and significant stenosis seen on the right  SPECIMEN(S):  Left common femoral, profunda femoris, and superficial femoral artery plaque.  INDICATIONS:    Patient presents with rest Taylor and scab formation on multiple toes on the left foot with severe aortoiliac disease and left femoral artery disease seen on angiogram.  Left femoral endarterectomy as well as aortoiliac intervention is planned to try to improve perfusion.  The risks and benefits as well as alternative therapies including intervention were reviewed in detail all questions were answered the patient agrees to proceed with surgery.  DESCRIPTION: After obtaining full informed written consent, the patient was brought back to the  operating room and placed supine upon the operating table.  The patient received IV antibiotics prior to induction.  After obtaining adequate anesthesia, the patient was prepped and draped in the standard fashion appropriate time out is called.    Vertical incision was created overlying the left femoral arteries. The common femoral artery proximally, and superficial femoral artery, and primary profunda femoris artery branches were encircled with vessel loops and prepared for control. The left femoral arteries were found to have significant plaque from the common femoral artery into the profunda and superficial femoral arteries.   5000 units of heparin was given and allowed circulate for 5 minutes.   Attention is then turned to the left femoral artery.  An arteriotomy is made with 11 blade and extended with Potts scissors in the common femoral artery and carried down onto the first 2-3 cm of the superficial femoral artery. An endarterectomy was then performed. The [facility] was used to create a plane. The proximal endpoint was cut flush with tenotomy scissors. This was in the proximal common femoral artery. An eversion endarterectomy was then performed for the first portion of the profunda femoris artery with a nice feathered distal endpoint removed with gentle traction. Good backbleeding was then seen. The distal endpoint of the superficial femoral artery endarterectomy was created with gentle traction and the distal endpoint was fairly clean.  The Cormatrix patcth is then selected and prepared for a patch angioplasty.  It is cut and beveled and started at the proximal endpoint with a 6-0 Prolene suture.  Approximately one half of the suture line is run medially and laterally and the distal end point was cut and bevelled to match the arteriotomy.  A second  6-0 Prolene was started at the distal end point and run to the mid portion to complete the arteriotomy.  The vessel was flushed prior to release of  control and completion of the anastomosis.  At this point, flow was established first to the profunda femoris artery and then to the superficial femoral artery.   At this point, we turned our attention to the endovascular portion of the procedure.  Dr. Delana Meyer working on the right and myself working on the left, we began by gaining access.  Dr. Delana Meyer used ultrasound guidance and accessed the right femoral artery without difficulty and a permanent image was recorded.  I accessed the patch of the left femoral artery directly.  7 French sheaths were placed on each side.  An additional 2000 units of heparin were given for this portion of the procedure.  Dr. Delana Meyer put a catheter in the aorta and an aortogram was performed demonstrating some degree of right renal artery stenosis with multiple left renal arteries.  The aorta was heavily diseased and the most distal segment with spillover disease in the 70% range in the right common iliac artery and occlusion of the left common iliac artery.  There is then a separate distinct stenosis in the 50 to 60% range in the proximal right external iliac artery.  The left external iliac artery reconstituted but was heavily diseased with greater than 70% stenosis down to its distal segment a couple of centimeters above the endarterectomy patch site.  Using a Kumpe catheter and advantage wire, from the left side was able to get through the occlusion and actually get access into the right common iliac artery first.  We snared this wire from Dr. Delana Meyer side and brought the 7 French sheath up to the distal aorta to help augment being intraluminal in the aorta.  The sheath was advanced over this wire and his own advantage wire and taken up to the distal aorta and proximal right common iliac artery.  I then removed my wire and used a Kumpe catheter to lip the catheter and wire up into the aorta and confirmed intraluminal flow.  Dr. Delana Meyer confirmed that he was intraluminal from the  right side as well.  Advantage wires were then up on both sides and we had 7 French 23 cm sheaths to the distal aorta on both sides.  We selected a 8 mm diameter by 58 mm length lifestream stents and deployed these in a kissing stent fashion into the distal aorta and bilateral common iliac arteries.  On the right side, for the separate distinct disease in the proximal right external iliac artery a 6 mm diameter by 6 cm length Lutonix drug-coated angioplasty balloon was inflated to 8 atm for 1 minute and completion imaging on the right side showed less than 20% residual stenosis in the external iliac artery angioplasty site and less than 10% residual stenosis in the right common iliac artery stent.  On the left side, for the extensive disease down to the external iliac artery 2 additional stent placements were required.  An 8 mm diameter by 58 mm length lifestream stent was deployed from the distal common iliac artery down to the proximal external iliac artery and an additional 8 mm diameter by 7.5 cm length Viabahn stent was deployed in the external iliac artery down to its most distal segment just above the patch for the endarterectomy.  The Viabahn was postdilated with a 7 mm balloon and angiographic result showed less than 10% residual stenosis  in the left common and external iliac arteries after stent placement.  Also image further down the left lower extremity to determine if any infrainguinal disease was present.  This was done through the left femoral sheath.  This demonstrated the left common femoral artery, profunda femoris artery, and superficial femoral artery endarterectomies were all widely patent.  The left SFA was patent without focal stenosis as was the left popliteal artery.  There is a normal tibial trifurcation with runoff through both the anterior tibial and posterior tibial arteries distally without obvious focal stenosis  Surgicel and Vistacel topical hemostatic agents were placed in the  femoral incision and hemostasis was complete. The femoral incision was then closed in a layered fashion with 2 layers of 2-0 Vicryl, 2 layers of 3-0 Vicryl, and 4-0 Monocryl for the skin closure. An incisional VAC was placed over the wound  The patient was then awakened from anesthesia and taken to the recovery room in stable condition having tolerated the procedure well.  COMPLICATIONS: None  CONDITION: Stable     Alison Taylor 06/02/2022 3:14 PM   This note was created with Dragon Medical transcription system. Any errors in dictation are purely unintentional.

## 2022-06-02 NOTE — Anesthesia Procedure Notes (Signed)
Arterial Line Insertion Start/End7/19/2023 2:02 PM, 06/02/2022 2:03 PM Performed by: Iran Ouch, MD, anesthesiologist  Patient location: OR. Preanesthetic checklist: patient identified, IV checked, site marked, risks and benefits discussed, surgical consent, monitors and equipment checked, pre-op evaluation, timeout performed and anesthesia consent Lidocaine 1% used for infiltration Left, radial was placed Catheter size: 20 G Hand hygiene performed  and maximum sterile barriers used   Attempts: 1 Procedure performed using ultrasound guided technique. Ultrasound Notes:anatomy identified, needle tip was noted to be adjacent to the nerve/plexus identified and no ultrasound evidence of intravascular and/or intraneural injection Following insertion, dressing applied. Post procedure assessment: normal and unchanged  Patient tolerated the procedure well with no immediate complications.

## 2022-06-02 NOTE — Anesthesia Procedure Notes (Addendum)
Procedure Name: Intubation Date/Time: 06/02/2022 11:32 AM  Performed by: Loletha Grayer, CRNAPre-anesthesia Checklist: Patient identified, Patient being monitored, Timeout performed, Emergency Drugs available and Suction available Patient Re-evaluated:Patient Re-evaluated prior to induction Oxygen Delivery Method: Circle system utilized Preoxygenation: Pre-oxygenation with 100% oxygen Induction Type: IV induction Ventilation: Mask ventilation without difficulty Laryngoscope Size: Mac and 3 Grade View: Grade I Tube type: Oral Tube size: 7.0 mm Number of attempts: 1 Airway Equipment and Method: Stylet Placement Confirmation: ETT inserted through vocal cords under direct vision, positive ETCO2 and breath sounds checked- equal and bilateral Secured at: 21 cm Tube secured with: Tape Dental Injury: Teeth and Oropharynx as per pre-operative assessment

## 2022-06-03 ENCOUNTER — Encounter: Payer: Self-pay | Admitting: Vascular Surgery

## 2022-06-03 DIAGNOSIS — Z9889 Other specified postprocedural states: Secondary | ICD-10-CM

## 2022-06-03 DIAGNOSIS — Z95828 Presence of other vascular implants and grafts: Secondary | ICD-10-CM

## 2022-06-03 LAB — CBC
HCT: 35 % — ABNORMAL LOW (ref 36.0–46.0)
Hemoglobin: 11.5 g/dL — ABNORMAL LOW (ref 12.0–15.0)
MCH: 30.7 pg (ref 26.0–34.0)
MCHC: 32.9 g/dL (ref 30.0–36.0)
MCV: 93.6 fL (ref 80.0–100.0)
Platelets: 273 10*3/uL (ref 150–400)
RBC: 3.74 MIL/uL — ABNORMAL LOW (ref 3.87–5.11)
RDW: 13.8 % (ref 11.5–15.5)
WBC: 18.3 10*3/uL — ABNORMAL HIGH (ref 4.0–10.5)
nRBC: 0 % (ref 0.0–0.2)

## 2022-06-03 LAB — HEMOGLOBIN A1C
Hgb A1c MFr Bld: 7 % — ABNORMAL HIGH (ref 4.8–5.6)
Mean Plasma Glucose: 154.2 mg/dL

## 2022-06-03 LAB — GLUCOSE, CAPILLARY
Glucose-Capillary: 131 mg/dL — ABNORMAL HIGH (ref 70–99)
Glucose-Capillary: 188 mg/dL — ABNORMAL HIGH (ref 70–99)
Glucose-Capillary: 112 mg/dL — ABNORMAL HIGH (ref 70–99)
Glucose-Capillary: 166 mg/dL — ABNORMAL HIGH (ref 70–99)

## 2022-06-03 LAB — BASIC METABOLIC PANEL
Anion gap: 4 — ABNORMAL LOW (ref 5–15)
BUN: 23 mg/dL (ref 8–23)
CO2: 27 mmol/L (ref 22–32)
Calcium: 8.3 mg/dL — ABNORMAL LOW (ref 8.9–10.3)
Chloride: 105 mmol/L (ref 98–111)
Creatinine, Ser: 1.08 mg/dL — ABNORMAL HIGH (ref 0.44–1.00)
GFR, Estimated: 57 mL/min — ABNORMAL LOW (ref >60)
Glucose, Bld: 299 mg/dL — ABNORMAL HIGH (ref 70–99)
Potassium: 5.1 mmol/L (ref 3.5–5.1)
Sodium: 136 mmol/L (ref 135–145)

## 2022-06-03 NOTE — Anesthesia Postprocedure Evaluation (Signed)
Anesthesia Post Note  Patient: Alison Taylor  Procedure(s) Performed: ENDARTERECTOMY FEMORAL (Left) INSERTION OF ILIAC STENT (Bilateral) APPLICATION OF CELL SAVER  Patient location during evaluation: SICU Anesthesia Type: General Level of consciousness: awake, oriented and awake and alert Pain management: pain level controlled Vital Signs Assessment: post-procedure vital signs reviewed and stable Respiratory status: spontaneous breathing and patient connected to nasal cannula oxygen Cardiovascular status: stable Postop Assessment: no apparent nausea or vomiting Anesthetic complications: no   No notable events documented.   Last Vitals:  Vitals:   06/03/22 0500 06/03/22 0600  BP: (!) 120/46 (!) 119/57  Pulse: 86 84  Resp: 15 15  Temp:    SpO2: 95% 93%    Last Pain:  Vitals:   06/03/22 0400  TempSrc: Axillary  PainSc: Asleep                 Hedda Slade

## 2022-06-03 NOTE — Evaluation (Signed)
Occupational Therapy Evaluation Patient Details Name: Alison Taylor MRN: [id number] DOB: 06-Jul-1956 Today's Date: 06/03/2022   History of Present Illness Pt is a 66 year old female admitted with Atherosclerotic occlusive disease left lower extremity with ulcerations and mild rest pain symptoms, now s/p Left common femoral, profunda femoris, and superficial femoral artery endarterectomies and patch angioplasty, Catheter placement into right external iliac artery from left femoral approach and into the aorta from right femoral approach, Aortogram and selective left lower extremity angiogram, Kissing balloon expandable stent placements to bilateral common iliac arteries in the distal aorta with 8 mm diameter by 58 mm length lifestream stents bilaterally.  Angioplasty of the right external iliac artery with 6 mm diameter by 6 cm length Lutonix drug-coated angioplasty balloon, Additional stent placement to the left external iliac artery with 8 mm diameter by 58 mm length lifestream stent and 8 mm diameter by 75 mm length Viabahn stent   Clinical Impression   Chart reviewed, pt greeted in room agreeable to OT evaluation. Co tx completed with PT on this date. Pt presents with deficits in endurance and mobility/ADL completion is limited by pain on this date. Pt was MOD I in ADL PTA, assist from husband for ADL as needed. MAX A required for LB dressing, STS with MIN A, step pivot transfer to bedside chair with CGA. Pt is left in bedside chair, NAD, all needs met. Recommend HHOT at this time to address deficits. OT will continue to follow acutely.      Recommendations for follow up therapy are one component of a multi-disciplinary discharge planning process, led by the attending physician.  Recommendations may be updated based on patient status, additional functional criteria and insurance authorization.   Follow Up Recommendations  Home health OT    Assistance Recommended at Discharge Intermittent  Supervision/Assistance  Patient can return home with the following A little help with walking and/or transfers    Functional Status Assessment  Patient has had a recent decline in their functional status and demonstrates the ability to make significant improvements in function in a reasonable and predictable amount of time.  Equipment Recommendations  BSC/3in1    Recommendations for Other Services       Precautions / Restrictions Precautions Precautions: Fall Restrictions Weight Bearing Restrictions: No      Mobility Bed Mobility Overal bed mobility: Needs Assistance Bed Mobility: Supine to Sit, Sit to Supine     Supine to sit: Mod assist          Transfers Overall transfer level: Needs assistance Equipment used: Rolling walker (2 wheels) Transfers: Sit to/from Stand Sit to Stand: Min assist                  Balance Overall balance assessment: Needs assistance Sitting-balance support: Feet supported Sitting balance-Leahy Scale: Fair     Standing balance support: Bilateral upper extremity supported, Reliant on assistive device for balance Standing balance-Leahy Scale: Poor                             ADL either performed or assessed with clinical judgement   ADL Overall ADL's : Needs assistance/impaired     Grooming: Wash/dry face;Sitting;Set up               Lower Body Dressing: Maximal assistance   Toilet Transfer: Min guard Toilet Transfer Details (indicate cue type and reason): simulated step pivot  Vision Patient Visual Report: No change from baseline       Perception     Praxis      Pertinent Vitals/Pain Pain Assessment Pain Assessment: Faces Faces Pain Scale: Hurts whole lot Pain Location: L groin and lower back Pain Descriptors / Indicators: Discomfort Pain Intervention(s): Monitored during session, RN gave pain meds during session, Limited activity within patient's tolerance, Repositioned      Hand Dominance     Extremity/Trunk Assessment Upper Extremity Assessment Upper Extremity Assessment: Overall WFL for tasks assessed   Lower Extremity Assessment Lower Extremity Assessment: Defer to PT evaluation   Cervical / Trunk Assessment Cervical / Trunk Assessment: Normal   Communication Communication Communication: No difficulties   Cognition Arousal/Alertness: Awake/alert Behavior During Therapy: WFL for tasks assessed/performed, Anxious Overall Cognitive Status: Within Functional Limits for tasks assessed                                       General Comments  spo2 >90% on RA throughout    Exercises Other Exercises Other Exercises: edu re: role of OT, role of rehab, importance of oob mobility   Shoulder Instructions      Home Living Family/patient expects to be discharged to:: Private residence Living Arrangements: Spouse/significant other;Other relatives (grand daugther) Available Help at Discharge: Family Type of Home: House Home Access: Stairs to enter Technical brewer of Steps: 4 Entrance Stairs-Rails: Left;Right;Can reach both Home Layout:  (1 step to get to [employer])     [employer] Shower/Tub: Occupational psychologist: Standard     Home Equipment: Cane - single point;Shower seat          Prior Functioning/Environment Prior Level of Function : Needs assist             Mobility Comments: MOD I with PRN cane ADLs Comments: PRN assist for dressing/bathing from spouse, pt is typicallyMOD I per report; family assists with IADLs        OT Problem List: Decreased strength;Decreased activity tolerance      OT Treatment/Interventions: Self-care/ADL training;Therapeutic exercise;DME and/or AE instruction;Energy conservation;Therapeutic activities;Patient/family education    OT Goals(Current goals can be found in the care plan section) Acute Rehab OT Goals Patient Stated Goal: go home OT Goal Formulation: With  patient Time For Goal Achievement: 06/17/22 Potential to Achieve Goals: Good ADL Goals Pt Will Perform Grooming: sitting;standing;with modified independence Pt Will Perform Lower Body Dressing: with modified independence;sit to/from stand Pt Will Transfer to Toilet: with modified independence;ambulating Pt Will Perform Toileting - Clothing Manipulation and hygiene: with modified independence;sit to/from stand  OT Frequency: Min 2X/week    Co-evaluation PT/OT/SLP Co-Evaluation/Treatment: Yes Reason for Co-Treatment: To address functional/ADL transfers PT goals addressed during session: Mobility/safety with mobility OT goals addressed during session: ADL's and self-care      AM-PAC OT "6 Clicks" Daily Activity     Outcome Measure Help from another person eating meals?: None Help from another person taking care of personal grooming?: None Help from another person toileting, which includes using toliet, bedpan, or urinal?: A Little Help from another person bathing (including washing, rinsing, drying)?: A Little Help from another person to put on and taking off regular upper body clothing?: A Little Help from another person to put on and taking off regular lower body clothing?: A Lot 6 Click Score: 19   End of Session Equipment Utilized During Treatment: Rolling walker (2  wheels);Oxygen Nurse Communication: Mobility status  Activity Tolerance: Patient tolerated treatment well Patient left: with call bell/phone within reach;in chair  OT Visit Diagnosis: Unsteadiness on feet (R26.81)                Time: 1700-1749 OT Time Calculation (min): 19 min Charges:  OT General Charges $OT Visit: 1 Visit OT Evaluation $OT Eval Low Complexity: [place], OTD OTR/L  06/03/22, 1:26 PM   Philippa Chester 06/03/2022, 1:26 PM

## 2022-06-03 NOTE — Evaluation (Signed)
Physical Therapy Evaluation Patient Details Name: Alison Taylor MRN: [id number] DOB: 1955-11-28 Today's Date: 06/03/2022  History of Present Illness  Patient is a 66 year old female with atherosclerotic occlusive disease left lower extremities with ulceration. Now s/p left femoral endarterectomy and insertion of iliac stent. PT ordered for post-op, ambulatory dysfunction. History of chronic pain.   Clinical Impression  Patient is agreeable to PT with encouragement. She requested to have pain medication prior to mobilizing and reports she has chronic back pain at baseline. She lives with her family and uses a cane intermittently for ambulation at home.  Patient was able to get to the edge of bed with assistance. She also required minimal assistance to stand from bed with rolling walker. Ambulation self limited due to reported pain with mobility. No physical assistance was required for walking a short distance in the room with rolling walker. Sp02 in the mid 90's on room air with mobility. Recommend to use rolling walker for safety with ambulation. PT will continue to follow to maximize independence and facilitate return to prior level of function.      Recommendations for follow up therapy are one component of a multi-disciplinary discharge planning process, led by the attending physician.  Recommendations may be updated based on patient status, additional functional criteria and insurance authorization.  Follow Up Recommendations Home health PT      Assistance Recommended at Discharge Intermittent Supervision/Assistance  Patient can return home with the following  A little help with walking and/or transfers;A little help with bathing/dressing/bathroom;Help with stairs or ramp for entrance;Assist for transportation    Equipment Recommendations Rolling walker (2 wheels)  Recommendations for Other Services       Functional Status Assessment Patient has had a recent decline in their  functional status and demonstrates the ability to make significant improvements in function in a reasonable and predictable amount of time.     Precautions / Restrictions Precautions Precautions: Fall Restrictions Weight Bearing Restrictions: No      Mobility  Bed Mobility Overal bed mobility: Needs Assistance Bed Mobility: Supine to Sit, Sit to Supine     Supine to sit: Mod assist     General bed mobility comments: assistance for LE and trunk support.    Transfers Overall transfer level: Needs assistance Equipment used: Rolling walker (2 wheels) Transfers: Sit to/from Stand Sit to Stand: Min assist           General transfer comment: lifting assistance for standing. verbal cues for hand placement with transfers for safety    Ambulation/Gait Ambulation/Gait assistance: Min guard Gait Distance (Feet): 5 Feet Assistive device: Rolling walker (2 wheels) Gait Pattern/deviations: Step-through pattern, Decreased stride length Gait velocity: decreased     General Gait Details: occasional cues for use of rolling walker. patient declined ambulating further due to pain.  Stairs            Wheelchair Mobility    Modified Rankin (Stroke Patients Only)       Balance Overall balance assessment: Needs assistance Sitting-balance support: Feet supported Sitting balance-Leahy Scale: Fair   Postural control:  (occasional flexed posture for comfort due to groin and back pain discomfort with mobility) Standing balance support: Bilateral upper extremity supported, Reliant on assistive device for balance Standing balance-Leahy Scale: Poor Standing balance comment: external support of the rolling walker required to maintain standing balance  Pertinent Vitals/Pain Pain Assessment Pain Assessment: Faces Faces Pain Scale: Hurts whole lot Pain Location: L groin and lower back Pain Descriptors / Indicators: Discomfort Pain  Intervention(s): Limited activity within patient's tolerance, Monitored during session, RN gave pain meds during session, Patient requesting pain meds-RN notified    Home Living Family/patient expects to be discharged to:: Private residence Living Arrangements: Spouse/significant other;Other relatives (granddaughter) Available Help at Discharge: Family Type of Home: House Home Access: Stairs to enter Entrance Stairs-Rails: Left;Right;Can reach both Technical brewer of Steps: 4   Home Layout:  (1 step to get to the den) Home Equipment: Kasandra Knudsen - single point;Shower seat      Prior Function Prior Level of Function : Needs assist             Mobility Comments: Mod I, occasional use of cane for ambulation ADLs Comments: she reports family assists with household chores and spouse assists PRN with dressing/bathing     Hand Dominance        Extremity/Trunk Assessment   Upper Extremity Assessment Upper Extremity Assessment: Defer to OT evaluation    Lower Extremity Assessment Lower Extremity Assessment: Generalized weakness (numbness reported on distal LLE unchanged from baseline)       Communication   Communication: No difficulties  Cognition Arousal/Alertness: Awake/alert Behavior During Therapy: WFL for tasks assessed/performed, Anxious Overall Cognitive Status: Within Functional Limits for tasks assessed                                 General Comments: patient perseverates on pain. needs encouragement to participate and was unwilling to mobilize without having pain meds first        General Comments General comments (skin integrity, edema, etc.): encouraged patient to be out of bed in chair for upright conditioning. Sp02 in the mid 90's on room air during mobility efforts.    Exercises     Assessment/Plan    PT Assessment Patient needs continued PT services  PT Problem List Decreased strength;Decreased activity tolerance;Decreased  balance;Decreased mobility;Decreased safety awareness;Pain;Impaired sensation       PT Treatment Interventions Gait training;DME instruction;Stair training;Functional mobility training;Therapeutic activities;Therapeutic exercise;Neuromuscular re-education;Balance training;Patient/family education    PT Goals (Current goals can be found in the Care Plan section)  Acute Rehab PT Goals Patient Stated Goal: to go home PT Goal Formulation: With patient Time For Goal Achievement: 06/17/22 Potential to Achieve Goals: Good    Frequency Min 2X/week     Co-evaluation PT/OT/SLP Co-Evaluation/Treatment: Yes Reason for Co-Treatment: To address functional/ADL transfers PT goals addressed during session: Mobility/safety with mobility         AM-PAC PT "6 Clicks" Mobility  Outcome Measure Help needed turning from your back to your side while in a flat bed without using bedrails?: A Little Help needed moving from lying on your back to sitting on the side of a flat bed without using bedrails?: A Lot Help needed moving to and from a bed to a chair (including a wheelchair)?: A Little Help needed standing up from a chair using your arms (e.g., wheelchair or bedside chair)?: A Little Help needed to walk in hospital room?: A Little Help needed climbing 3-5 steps with a railing? : A Lot 6 Click Score: 16    End of Session   Activity Tolerance: Patient limited by pain Patient left: in chair;with call bell/phone within reach Nurse Communication: Mobility status PT Visit Diagnosis: Unsteadiness on feet (R26.81);Muscle weakness (  generalized) (M62.81);Pain Pain - part of body:  (L groin and lower back)    Time: 5830-7460 PT Time Calculation (min) (ACUTE ONLY): 25 min   Charges:   PT Evaluation $PT Eval Moderate Complexity: 1 Mod          Minna Merritts, PT, MPT   Percell Locus 06/03/2022, 12:39 PM

## 2022-06-03 NOTE — Progress Notes (Cosign Needed Addendum)
Patient is not able to walk the distance required to go the bathroom, or he/she is unable to safely negotiate stairs required to access the bathroom.  A 3in1 BSC will alleviate this problem  

## 2022-06-03 NOTE — Progress Notes (Signed)
[facility]  Daily Progress Note   Subjective  -   Significant pain last night but much better today.  No major events overnight.  Objective Vitals:   06/03/22 0900 06/03/22 1000 06/03/22 1100 06/03/22 1200  BP: 127/60 (!) 97/50 (!) 88/65 (!) 94/51  Pulse: 84 81 80 76  Resp: '16 16 15 17  '$ Temp:      TempSrc:      SpO2: 98% 97% 100% 94%  Weight:      Height:        Intake/Output Summary (Last 24 hours) at 06/03/2022 1311 Last data filed at 06/03/2022 0900 Gross per 24 hour  Intake 3102.66 ml  Output 1175 ml  Net 1927.66 ml    PULM  CTAB CV  RRR VASC  incisional VAC is in place with good seal.  Pedal pulses are palpable  Laboratory CBC    Component Value Date/Time   WBC 18.3 (H) 06/02/2022 2339   HGB 11.5 (L) 06/02/2022 2339   HCT 35.0 (L) 06/02/2022 2339   PLT 273 06/02/2022 2339    BMET    Component Value Date/Time   NA 136 06/02/2022 2339   K 5.1 06/02/2022 2339   CL 105 06/02/2022 2339   CO2 27 06/02/2022 2339   GLUCOSE 299 (H) 06/02/2022 2339   BUN 23 06/02/2022 2339   CREATININE 1.08 (H) 06/02/2022 2339   CALCIUM 8.3 (L) 06/02/2022 2339   GFRNONAA 57 (L) 06/02/2022 2339   GFRAA >60 04/12/2016 1213    Assessment/Planning: POD #1 s/p bilateral iliac stent placement and left femoral endarterectomy for ischemic rest pain and early ulceration in the left lower extremity and bilateral claudication  Doing quite well Respiratory status seems to be near baseline Palpable pedal pulse present now. Okay to move out to the floor Transition over to oral pain regimen and likely discharge home tomorrow   [name]  06/03/2022, 1:11 PM

## 2022-06-03 NOTE — Progress Notes (Signed)
Reached out to [facility] and spoke to Adriene to see if They can accept the patient for [facility] PT,  They will let me know, A RW and 3 in 1 will be delivered to the patient's room to go home with her

## 2022-06-04 LAB — GLUCOSE, CAPILLARY
Glucose-Capillary: 81 mg/dL (ref 70–99)
Glucose-Capillary: 182 mg/dL — ABNORMAL HIGH (ref 70–99)
Glucose-Capillary: 154 mg/dL — ABNORMAL HIGH (ref 70–99)
Glucose-Capillary: 203 mg/dL — ABNORMAL HIGH (ref 70–99)

## 2022-06-04 LAB — SURGICAL PATHOLOGY

## 2022-06-04 MED ORDER — SODIUM CHLORIDE 0.9 % IV SOLN: INTRAVENOUS | Status: DC | PRN

## 2022-06-04 NOTE — Progress Notes (Signed)
Physical Therapy Treatment Patient Details Name: DEMPSEY KNOTEK MRN: [id number] DOB: 1956-03-08 Today's Date: 06/04/2022   History of Present Illness Patient is a 66 year old female with atherosclerotic occlusive disease left lower extremities with ulceration. Now s/p left femoral endarterectomy and insertion of iliac stent. PT ordered for post-op, ambulatory dysfunction. History of chronic pain.    PT Comments    Patient is agreeable to PT. Her pain seems better controlled today. She ambulated around the unit with rolling walker with improving gait pattern with increased gait distance. Supervision required with mobility today with occasional cues for safety and proper use of rolling walker. Recommend to continue PT to maximize independence and HHPT at discharge.    Recommendations for follow up therapy are one component of a multi-disciplinary discharge planning process, led by the attending physician.  Recommendations may be updated based on patient status, additional functional criteria and insurance authorization.  Follow Up Recommendations  Home health PT     Assistance Recommended at Discharge Intermittent Supervision/Assistance  Patient can return home with the following A little help with walking and/or transfers;A little help with bathing/dressing/bathroom;Help with stairs or ramp for entrance;Assist for transportation   Equipment Recommendations  Rolling walker (2 wheels) (DME has been delivered to room already and rolling walker adjusted)    Recommendations for Other Services       Precautions / Restrictions Precautions Precautions: Fall Restrictions Weight Bearing Restrictions: No     Mobility  Bed Mobility Overal bed mobility: Needs Assistance Bed Mobility: Supine to Sit, Sit to Supine     Supine to sit: Supervision Sit to supine: Supervision   General bed mobility comments: cues for technique    Transfers Overall transfer level: Needs  assistance Equipment used: Rolling walker (2 wheels) Transfers: Sit to/from Stand Sit to Stand: Supervision           General transfer comment: cues for technique for hand placement    Ambulation/Gait Ambulation/Gait assistance: Supervision Gait Distance (Feet): 200 Feet Assistive device: Rolling walker (2 wheels) Gait Pattern/deviations: Step-to pattern, Step-through pattern Gait velocity: decreased     General Gait Details: step to pattern initially progressing to step through with increased step length bilaterally. occasional cues for proper use of rolling walker and to avoid trunk flexion   Stairs Stairs:  (verbally instructed patient on the sequencing of BLE with rails to enter and exit her home. she verbalized understanding)           Wheelchair Mobility    Modified Rankin (Stroke Patients Only)       Balance Overall balance assessment: Needs assistance Sitting-balance support: Feet supported Sitting balance-Leahy Scale: Fair     Standing balance support: Bilateral upper extremity supported, During functional activity Standing balance-Leahy Scale: Fair                              Cognition Arousal/Alertness: Awake/alert Behavior During Therapy: WFL for tasks assessed/performed, Anxious Overall Cognitive Status: Within Functional Limits for tasks assessed                                          Exercises      General Comments General comments (skin integrity, edema, etc.): encouraged routine short bouts of walking at home and to use rolling walker for safety at this time  Pertinent Vitals/Pain Pain Assessment Pain Assessment: 0-10 Pain Score: 4  Pain Location: L groin and lower back Pain Descriptors / Indicators: Discomfort Pain Intervention(s): Limited activity within patient's tolerance, Premedicated before session, Monitored during session    Home Living                          Prior  Function            PT Goals (current goals can now be found in the care plan section) Acute Rehab PT Goals Patient Stated Goal: to go home PT Goal Formulation: With patient Time For Goal Achievement: 06/17/22 Potential to Achieve Goals: Good Progress towards PT goals: Progressing toward goals    Frequency    Min 2X/week      PT Plan Current plan remains appropriate    Co-evaluation              AM-PAC PT "6 Clicks" Mobility   Outcome Measure  Help needed turning from your back to your side while in a flat bed without using bedrails?: A Little Help needed moving from lying on your back to sitting on the side of a flat bed without using bedrails?: A Little Help needed moving to and from a bed to a chair (including a wheelchair)?: A Little Help needed standing up from a chair using your arms (e.g., wheelchair or bedside chair)?: A Little Help needed to walk in hospital room?: A Little Help needed climbing 3-5 steps with a railing? : A Little 6 Click Score: 18    End of Session   Activity Tolerance: Patient tolerated treatment well Patient left: in bed;with call bell/phone within reach;with bed alarm set Nurse Communication: Mobility status PT Visit Diagnosis: Unsteadiness on feet (R26.81);Muscle weakness (generalized) (M62.81);Pain     Time: 7371-0626 PT Time Calculation (min) (ACUTE ONLY): 27 min  Charges:  $Gait Training: 8-22 mins $Therapeutic Activity: 8-22 mins                     Minna Merritts, PT, MPT    Percell Locus 06/04/2022, 11:05 AM

## 2022-06-04 NOTE — Plan of Care (Signed)

## 2022-06-04 NOTE — Progress Notes (Signed)
[facility]  Daily Progress Note   Subjective  -   Complains of some pain but seems to be reasonably comfortable.  No major events overnight.  Respiratory status is stable  Objective Vitals:   06/04/22 0300 06/04/22 0400 06/04/22 0600 06/04/22 0800  BP:  122/66 111/62 (!) 88/67  Pulse: 84 85 82   Resp: '18 20 18   '$ Temp:    97.9 F (36.6 C)  TempSrc:    Oral  SpO2: 100% 99% 90%   Weight:      Height:        Intake/Output Summary (Last 24 hours) at 06/04/2022 0843 Last data filed at 06/04/2022 0654 Gross per 24 hour  Intake 1792.42 ml  Output 2951 ml  Net -1158.58 ml    PULM  CTAB CV  RRR VASC  VAC in place with good seal.  Palpable pedal pulses bilaterally  Laboratory CBC    Component Value Date/Time   WBC 18.3 (H) 06/02/2022 2339   HGB 11.5 (L) 06/02/2022 2339   HCT 35.0 (L) 06/02/2022 2339   PLT 273 06/02/2022 2339    BMET    Component Value Date/Time   NA 136 06/02/2022 2339   K 5.1 06/02/2022 2339   CL 105 06/02/2022 2339   CO2 27 06/02/2022 2339   GLUCOSE 299 (H) 06/02/2022 2339   BUN 23 06/02/2022 2339   CREATININE 1.08 (H) 06/02/2022 2339   CALCIUM 8.3 (L) 06/02/2022 2339   GFRNONAA 57 (L) 06/02/2022 2339   GFRAA >60 04/12/2016 1213    Assessment/Planning: POD #2 s/p left femoral endarterectomy, bilateral iliac stents for atherosclerosis with ulceration left lower extremity  Doing reasonably well.  Seems to be at her baseline of pulmonary status and likely activity Would like to see her get up and move around more today Is on a large amount of narcotics at home at a baseline so pain control may be an issue. Can likely go home later today or tomorrow depending on her mobility pain this afternoon   [name]  06/04/2022, 8:43 AM

## 2022-06-04 NOTE — Progress Notes (Signed)
Occupational Therapy Treatment Patient Details Name: Alison Taylor MRN: [id number] DOB: [birth year] Today's Date: 06/04/2022   History of present illness Pt is a 66 year old female admitted with Atherosclerotic occlusive disease left lower extremity with ulcerations and mild rest pain symptoms, now s/p Left common femoral, profunda femoris, and superficial femoral artery endarterectomies and patch angioplasty, Catheter placement into right external iliac artery from left femoral approach and into the aorta from right femoral approach, Aortogram and selective left lower extremity angiogram, Kissing balloon expandable stent placements to bilateral common iliac arteries in the distal aorta with 8 mm diameter by 58 mm length lifestream stents bilaterally.  Angioplasty of the right external iliac artery with 6 mm diameter by 6 cm length Lutonix drug-coated angioplasty balloon, Additional stent placement to the left external iliac artery with 8 mm diameter by 58 mm length lifestream stent and 8 mm diameter by 75 mm length Viabahn stent   OT comments  Chart reviewed, pt greeted in bed stating she needed use the bathroom, requests to utilize [facility]. Pt with improved tolerance for mobility on this date as compared to evaluation. Pt requires verbal and tactile cues for safe use of RW with transfer and while standing at the sink completing grooming tasks. Pt continues to perform below PLOF, continue to recommend discharge home with HHOT, use of bed side commode. Pt is left as received, NAD, all needs met. OT will follow acutely.    Recommendations for follow up therapy are one component of a multi-disciplinary discharge planning process, led by the attending physician.  Recommendations may be updated based on patient status, additional functional criteria and insurance authorization.    Follow Up Recommendations  Home health OT    Assistance Recommended at Discharge Intermittent Supervision/Assistance  Patient  can return home with the following  A little help with walking and/or transfers   Equipment Recommendations  BSC/3in1    Recommendations for Other Services      Precautions / Restrictions Precautions Precautions: Fall Restrictions Weight Bearing Restrictions: No       Mobility Bed Mobility Overal bed mobility: Needs Assistance Bed Mobility: Supine to Sit, Sit to Supine     Supine to sit: Supervision Sit to supine: Supervision        Transfers Overall transfer level: Needs assistance Equipment used: Rolling walker (2 wheels) Transfers: Sit to/from Stand Sit to Stand: Supervision                 Balance Overall balance assessment: Needs assistance Sitting-balance support: Feet supported Sitting balance-Leahy Scale: Fair     Standing balance support: Bilateral upper extremity supported, During functional activity Standing balance-Leahy Scale: Fair                             ADL either performed or assessed with clinical judgement   ADL Overall ADL's : Needs assistance/impaired     Grooming: Wash/dry hands;Standing;Supervision/safety Grooming Details (indicate cue type and reason): with RW at sink level             Lower Body Dressing: Maximal assistance   Toilet Transfer: Min guard;Ambulation Toilet Transfer Details (indicate cue type and reason): short amb transfter Toileting- Clothing Manipulation and Hygiene: Min guard;Sit to/from stand       Functional mobility during ADLs: Supervision/safety;Rolling walker (2 wheels)      Extremity/Trunk Assessment              Vision  Perception     Praxis      Cognition Arousal/Alertness: Awake/alert Behavior During Therapy: WFL for tasks assessed/performed, Anxious, Impulsive Overall Cognitive Status: Within Functional Limits for tasks assessed                                 General Comments: pt attempts to transfer to bsc without use of RW before  provided cues, pt able to perform safe transfer with RW following vcs        Exercises      Shoulder Instructions       General Comments      Pertinent Vitals/ Pain       Pain Assessment Pain Assessment: Faces Faces Pain Scale: Hurts little more Pain Location: L groin Pain Descriptors / Indicators: Discomfort Pain Intervention(s): Limited activity within patient's tolerance, Monitored during session  Home Living                                          Prior Functioning/Environment              Frequency  Min 2X/week        Progress Toward Goals  OT Goals(current goals can now be found in the care plan section)  Progress towards OT goals: Progressing toward goals     Plan Discharge plan remains appropriate    Co-evaluation                 AM-PAC OT "6 Clicks" Daily Activity     Outcome Measure   Help from another person eating meals?: None Help from another person taking care of personal grooming?: None Help from another person toileting, which includes using toliet, bedpan, or urinal?: A Little Help from another person bathing (including washing, rinsing, drying)?: A Little Help from another person to put on and taking off regular upper body clothing?: A Little Help from another person to put on and taking off regular lower body clothing?: A Lot 6 Click Score: 19    End of Session Equipment Utilized During Treatment: Rolling walker (2 wheels);Oxygen  OT Visit Diagnosis: Unsteadiness on feet (R26.81)   Activity Tolerance     Patient Left in bed;with call bell/phone within reach;with bed alarm set   Nurse Communication          Time: 1655-3748 OT Time Calculation (min): 9 min  Charges: OT General Charges $OT Visit: 1 Visit OT Treatments $Self Care/Home Management : 8-22 mins  Shanon Payor, OTD OTR/L  06/04/22, 3:22 PM

## 2022-06-05 LAB — GLUCOSE, CAPILLARY
Glucose-Capillary: 80 mg/dL (ref 70–99)
Glucose-Capillary: 120 mg/dL — ABNORMAL HIGH (ref 70–99)

## 2022-06-05 MED ORDER — OXYCODONE-ACETAMINOPHEN 5-325 MG PO TABS: 2.0000 | ORAL_TABLET | Freq: Four times a day (QID) | ORAL | 0 refills | Status: AC | PRN

## 2022-06-05 MED ORDER — ATORVASTATIN CALCIUM 10 MG PO TABS: 10.0000 mg | ORAL_TABLET | Freq: Every day | ORAL | 0 refills | Status: AC

## 2022-06-05 MED ORDER — DOCUSATE SODIUM 100 MG PO CAPS: 100.0000 mg | ORAL_CAPSULE | Freq: Every day | ORAL | 0 refills | Status: AC

## 2022-06-05 MED ORDER — CLOPIDOGREL BISULFATE 75 MG PO TABS: 75.0000 mg | ORAL_TABLET | Freq: Every day | ORAL | 0 refills | Status: AC

## 2022-06-05 NOTE — Discharge Summary (Signed)
[facility]    Discharge Summary    Patient ID:  Alison Taylor MRN: [id number] DOB/AGE: 1956-05-14 66 y.o.  Admit date: 06/02/2022 Discharge date: 06/05/2022 Date of Surgery: 06/02/2022 Surgeon: Surgeon(s): Dew, Erskine Squibb, MD Schnier, Dolores Lory, MD  Admission Diagnosis: Atherosclerosis of artery of extremity with ulceration [facility]) [I70.299, L97.909]  Discharge Diagnoses:  Atherosclerosis of artery of extremity with ulceration ([place]) [I70.299, L97.909]  Secondary Diagnoses: Past Medical History:  Diagnosis Date   (HFpEF) heart failure with preserved ejection fraction ([place])    a.) TTE 03/17/2020: EF >55%, no RWMAs, norm RVSF; b.) TTE 12/19/2020: EF >55%, mild LVH, mild RAE, mild MAC, triv MR/TR/PR, G1DD; c.) TTE 12/16/2021: EF >55%, mod LVD, G1DD.   Acromegalia ([place])    a.) on cabergoline   Anemia    Anxiety    Aortic atherosclerosis (HCC)    Arthritis    Chronic prescription opiate use    a.) TD fentanyl + oxy/APAP; b.) has naloxone Rx   Chronic respiratory failure with hypoxia, on home oxygen therapy ([place])    a.) chronic supplemental oxygen at 2L/[place]   COPD (chronic obstructive pulmonary disease) (HCC)    GERD (gastroesophageal reflux disease)    HLD (hyperlipidemia)    Hypertension    Hypothyroidism    Lumbosacral spinal stenosis    Myalgia due to statin    PAD (peripheral artery disease) ([place]) 05/06/2022   a.) PVC 05/06/2022 --> 60-70% RCIA, occlusion LCIA, 50% RFA   Palpitations    Paresthesias (hands)    Pituitary adenoma (HCC)    Tobacco abuse    Type 2 diabetes mellitus treated with insulin (HCC)    Type 2 MI (myocardial infarction) ([place]) 10/31/2020   a.) troponins 102 --> 304 --> 458; etiology felt to be secondary to AECOPD   Vitamin D deficiency     Procedure(s): ENDARTERECTOMY FEMORAL INSERTION OF ILIAC STENT APPLICATION OF CELL SAVER  Discharged Condition: good  HPI:  Patient admitted with nonhealing ulcerations and  disabling claudication symptoms of the left lower extremity. Elective intervention for revascularization.  Hospital Course:  Alison Taylor is a 66 y.o. female is S/P Left Procedure(s): ENDARTERECTOMY FEMORAL INSERTION OF ILIAC STENT APPLICATION OF CELL SAVER Extubated: POD # 0 Physical exam: Alert, NAD, CV: RR, Lungs: CTA, Extremities- warm, palpable DP bilaterally, LEFT groin incision- C/D/I, ecchymosis, soft, RIGHT groin access site- soft, no hematoma Post-op wounds clean, dry, intact or healing well Pt. Ambulating, voiding and taking PO diet without difficulty. Pt pain controlled with PO pain meds. Labs as below Complications:none  Consults:    Significant Diagnostic Studies: CBC Lab Results  Component Value Date   WBC 18.3 (H) 06/02/2022   HGB 11.5 (L) 06/02/2022   HCT 35.0 (L) 06/02/2022   MCV 93.6 06/02/2022   PLT 273 06/02/2022    BMET    Component Value Date/Time   NA 136 06/02/2022 2339   K 5.1 06/02/2022 2339   CL 105 06/02/2022 2339   CO2 27 06/02/2022 2339   GLUCOSE 299 (H) 06/02/2022 2339   BUN 23 06/02/2022 2339   CREATININE 1.08 (H) 06/02/2022 2339   CALCIUM 8.3 (L) 06/02/2022 2339   GFRNONAA 57 (L) 06/02/2022 2339   GFRAA >60 04/12/2016 1213   COAG Lab Results  Component Value Date   INR 1.0 06/21/2021     Disposition:  Discharge to :Home with Home PT/OT Discharge Instructions     Call MD for:  redness, tenderness, or signs of infection (  pain, swelling, bleeding, redness, odor or green/yellow discharge around incision site)   Complete by: As directed    Call MD for:  severe or increased pain, loss or decreased feeling  in affected limb(s)   Complete by: As directed    Call MD for:  temperature >100.5   Complete by: As directed    Discharge instructions   Complete by: As directed    Please call/return for increased pain in legs or at incision, redness or drainage from incisions.   Driving Restrictions   Complete by: As directed    No  driving for 3 weeks   Face-to-face encounter (required for Medicare/Medicaid patients)   Complete by: As directed    I Asad Keeven A certify that this patient is under my care and that I, or a nurse practitioner or physician's assistant working with me, had a face-to-face encounter that meets the physician face-to-face encounter requirements with this patient on 06/05/2022. The encounter with the patient was in whole, or in part for the following medical condition(s) which is the primary reason for home health care (List medical condition): Ischemia lower extremity s/p surgery for revascularization   The encounter with the patient was in whole, or in part, for the following medical condition, which is the primary reason for home health care: Atherosclerosis, Ischemia lower extremities, s/p revascularization   I certify that, based on my findings, the following services are medically necessary home health services: Physical therapy   Reason for Medically Necessary Home Health Services:  Therapy- Instruction on use of Assistive Device for Ambulation on all Surfaces Therapy- Therapeutic Exercises to Increase Strength and Endurance     My clinical findings support the need for the above services:  Pain interferes with ambulation/mobility Shortness of breath with activity     Further, I certify that my clinical findings support that this patient is homebound due to:  Ambulates short distances less than 300 feet Pain interferes with ambulation/mobility     Home Health   Complete by: As directed    To provide the following care/treatments:  PT OT     Increase activity slowly   Complete by: As directed    Walk with assistance use walker or cane as needed   Lifting restrictions   Complete by: As directed    No heavy lifting for 4 weeks   May shower    Complete by: As directed    No dressing needed   Complete by: As directed    Replace only if drainage present   Resume previous diet   Complete  by: As directed    Walker    Complete by: As directed       Allergies as of 06/05/2022       Reactions   Iodinated Contrast Media Other (See Comments), Anaphylaxis, Hives, Rash   Reaction:  Redness  Other reaction(s): Other (See Comments) Reaction:  Redness  IVP DYE CAUSED REDNESS AND SOB.  DOES FINE WITH PREMED. LBW 4/15 Reaction:  Redness    Iodine Hives   Has to have prep to use   Other Anaphylaxis   Iv contrast dye   Cephalexin Hives, Rash   Patient has tolerated cefepime previously (Nov 2020 admission)   Rosuvastatin    Other reaction(s): Other (See Comments), Other (See Comments) Severe leg cramps Severe leg cramps   Moxifloxacin Hcl    Acetaminophen-codeine Palpitations, Hives, Rash   Per pt, she is able to take tylenol, as well as hydrocodone & Norco.  Pt reports only unable to tolerate tylenol #3   Codeine Nausea Only   Ibuprofen Rash   Sulfa Antibiotics Anxiety   Other reaction(s): Other (See Comments), Other (See Comments) anxiety anxiety        Medication List     STOP taking these medications    oxyCODONE-acetaminophen 10-325 MG tablet Commonly known as: PERCOCET Replaced by: oxyCODONE-acetaminophen 5-325 MG tablet       TAKE these medications    acetaminophen 325 MG tablet Commonly known as: TYLENOL Take by mouth.   albuterol 108 (90 Base) MCG/ACT inhaler Commonly known as: VENTOLIN HFA Inhale 1-2 puffs into the lungs every 6 (six) hours as needed for wheezing or shortness of breath.   aspirin 81 MG chewable tablet Chew by mouth.   atorvastatin 10 MG tablet Commonly known as: LIPITOR Take 1 tablet (10 mg total) by mouth daily. Start taking on: June 06, 2022   Atrovent HFA 17 MCG/ACT inhaler Generic drug: ipratropium Inhale 2 puffs into the lungs in the morning, at noon, in the evening, and at bedtime.   b complex vitamins tablet Take 1 tablet by mouth daily.   cabergoline 0.5 MG tablet Commonly known as: DOSTINEX Take 0.5 mg by  mouth 3 (three) times a week.   cetirizine 10 MG tablet Commonly known as: ZYRTEC Take by mouth.   clopidogrel 75 MG tablet Commonly known as: PLAVIX Take 1 tablet (75 mg total) by mouth daily. Start taking on: June 06, 2022   cyclobenzaprine 10 MG tablet Commonly known as: FLEXERIL Take 10 mg by mouth 3 (three) times daily as needed for muscle spasms.   docusate sodium 100 MG capsule Commonly known as: COLACE Take 1 capsule (100 mg total) by mouth daily. Start taking on: June 06, 2022   ezetimibe 10 MG tablet Commonly known as: ZETIA Take 10 mg by mouth daily.   fentaNYL 12 MCG/HR Commonly known as: DURAGESIC USE 1 PATCH EVERY 72 HOURS FOR BACK PAIN   fluticasone furoate-vilanterol 100-25 MCG/ACT Aepb Commonly known as: BREO ELLIPTA Inhale into the lungs.   furosemide 40 MG tablet Commonly known as: LASIX Take 40 mg by mouth daily.   gabapentin 800 MG tablet Commonly known as: NEURONTIN Take 800 mg by mouth 3 (three) times daily.   HumaLOG KwikPen 100 UNIT/ML KwikPen Generic drug: insulin lispro SMARTSIG:5 Unit(s) SUB-Q   ipratropium-albuterol 0.5-2.5 (3) MG/3ML Soln Commonly known as: DUONEB Inhale into the lungs.   Lantus SoloStar 100 UNIT/ML Solostar Pen Generic drug: insulin glargine Inject 35 Units into the skin daily at 12 noon.   levothyroxine 137 MCG tablet Commonly known as: SYNTHROID Take 137 mcg by mouth daily before breakfast.   naloxone 2 MG/2ML injection Commonly known as: NARCAN 2 mg into each nostril once as needed (overdose).   OCUVITE EYE HEATLH GUMMIES PO Take 2 each by mouth daily.   omeprazole 20 MG capsule Commonly known as: PRILOSEC Take 20 mg by mouth daily as needed (for acid reflux).   ondansetron 4 MG tablet Commonly known as: ZOFRAN SMARTSIG:1 Tablet(s) By Mouth Every 12 Hours   ONE TOUCH ULTRA 2 w/Device Kit SMARTSIG:1 Kit(s) Via Meter Every Morning   OneTouch Ultra test strip Generic drug: glucose blood USE TO  CHECK BLOOD SUGAR 3 TIMES A DAY   oxyCODONE-acetaminophen 5-325 MG tablet Commonly known as: PERCOCET/ROXICET Take 2 tablets by mouth every 6 (six) hours as needed for severe pain or moderate pain. Replaces: oxyCODONE-acetaminophen 10-325 MG tablet   OXYGEN 2 L.  predniSONE 10 MG tablet Commonly known as: DELTASONE Take 1 tablet (10 mg total) by mouth daily.   Premarin vaginal cream Generic drug: conjugated estrogens Place 0.5 g vaginally 2 (two) times a week.   spironolactone 25 MG tablet Commonly known as: ALDACTONE Take 25 mg by mouth daily.   UltiCare Mini Pen Needles 31G X 6 MM Misc Generic drug: Insulin Pen Needle USE 5 TIMES A DAY AS DIRECTED   BD Pen Needle Micro U/F 32G X 6 MM Misc Generic drug: Insulin Pen Needle SMARTSIG:1 SUB-Q 5 Times Daily   umeclidinium bromide 62.5 MCG/ACT Aepb Commonly known as: INCRUSE ELLIPTA Inhale into the lungs.   venlafaxine XR 75 MG 24 hr capsule Commonly known as: EFFEXOR-XR Take 75 mg by mouth daily.   Vitamin D (Ergocalciferol) 1.25 MG (50000 UNIT) Caps capsule Commonly known as: DRISDOL Take 50,000 Units by mouth 2 (two) times a week.               Durable Medical Equipment  (From admission, onward)           Start     Ordered   06/05/22 0953  For home use only DME Walker rolling  Once       Question Answer Comment  Walker: With 5 Inch Wheels   Patient needs a walker to treat with the following condition Ischemia of left lower extremity   Patient needs a walker to treat with the following condition Pain of left lower extremity due to ischemia      06/05/22 0952   06/03/22 1327  For home use only DME 3 n 1  Once        06/03/22 1326   06/03/22 1244  For home use only DME Walker rolling  Once       Question Answer Comment  Walker: With [place]   Patient needs a walker to treat with the following condition Difficulty walking      06/03/22 1243           Verbal and written Discharge  instructions given to the patient. Wound care per Discharge AVS   Signed: Evaristo Bury, MD  06/05/2022, 9:54 AM

## 2022-06-18 ENCOUNTER — Ambulatory Visit (INDEPENDENT_AMBULATORY_CARE_PROVIDER_SITE_OTHER): Payer: Medicare Other | Admitting: Vascular Surgery

## 2022-06-18 ENCOUNTER — Encounter (INDEPENDENT_AMBULATORY_CARE_PROVIDER_SITE_OTHER): Payer: Self-pay | Admitting: Vascular Surgery

## 2022-06-18 VITALS — Resp 16 | Wt 184.8 lb

## 2022-06-18 DIAGNOSIS — E119 Type 2 diabetes mellitus without complications: Secondary | ICD-10-CM

## 2022-06-18 DIAGNOSIS — Z794 Long term (current) use of insulin: Secondary | ICD-10-CM

## 2022-06-18 DIAGNOSIS — I7025 Atherosclerosis of native arteries of other extremities with ulceration: Secondary | ICD-10-CM

## 2022-06-18 NOTE — Assessment & Plan Note (Signed)
Perfusion appears to be much better and the dry scabs are already starting to heal.  Her capillary refill is better.  She is no longer having rest pain.  We will have her come back in about 3 to 4 weeks to recheck her wound and check ABIs.  Continue current medical regimen.

## 2022-06-18 NOTE — Progress Notes (Signed)
Patient ID: Alison Taylor, female   DOB: October 31, 1956, 66 y.o.   MRN: [id number]  Chief Complaint  Patient presents with   Routine Post Op    [facility] 1 week follow up    HPI Alison Taylor is a 66 y.o. female.  Patient returns about 1 week after left femoral endarterectomy and bilateral iliac stent placement.  She is doing pretty well.  She does still have pain at her surgical site that radiates down the inside of her leg which is typical for nerve pain after this type of surgery.  Her incision is healing well.  She really does not get much swelling.  Her left leg and foot feel much better than they did prior to revascularization.  Her rest pain appears to be resolved.   Past Medical History:  Diagnosis Date   (HFpEF) heart failure with preserved ejection fraction ([place])    a.) TTE 03/17/2020: EF >55%, no RWMAs, norm RVSF; b.) TTE 12/19/2020: EF >55%, mild LVH, mild RAE, mild MAC, triv MR/TR/PR, G1DD; c.) TTE 12/16/2021: EF >55%, mod LVD, G1DD.   Acromegalia ([place])    a.) on cabergoline   Anemia    Anxiety    Aortic atherosclerosis ([place])    Arthritis    Chronic prescription opiate use    a.) TD fentanyl + oxy/APAP; b.) has naloxone Rx   Chronic respiratory failure with hypoxia, on home oxygen therapy ([place])    a.) chronic supplemental oxygen at 2L/[place]   COPD (chronic obstructive pulmonary disease) (HCC)    GERD (gastroesophageal reflux disease)    HLD (hyperlipidemia)    Hypertension    Hypothyroidism    Lumbosacral spinal stenosis    Myalgia due to statin    PAD (peripheral artery disease) ([place]) 05/06/2022   a.) PVC 05/06/2022 --> 60-70% RCIA, occlusion LCIA, 50% RFA   Palpitations    Paresthesias (hands)    Pituitary adenoma (HCC)    Tobacco abuse    Type 2 diabetes mellitus treated with insulin (HCC)    Type 2 MI (myocardial infarction) ([place]) 10/31/2020   a.) troponins 102 --> 304 --> 458; etiology felt to be secondary to AECOPD   Vitamin D deficiency     Past Surgical  History:  Procedure Laterality Date   ABDOMINAL HYSTERECTOMY  1989   CHOLECYSTECTOMY     ENDARTERECTOMY FEMORAL Left 06/02/2022   Procedure: ENDARTERECTOMY FEMORAL;  Surgeon: Algernon Huxley, MD;  Location: [facility] ORS;  Service: Vascular;  Laterality: Left;   INSERTION OF ILIAC STENT Bilateral 06/02/2022   Procedure: INSERTION OF ILIAC STENT;  Surgeon: Algernon Huxley, MD;  Location: [facility] ORS;  Service: Vascular;  Laterality: Bilateral;   Taylor EXTREMITY ANGIOGRAPHY Left 05/06/2022   Procedure: Taylor Extremity Angiography;  Surgeon: Algernon Huxley, MD;  Location: [facility];  Service: Cardiovascular;  Laterality: Left;   NECK SURGERY  2002   OVARIAN CYST SURGERY Left 1983   TIBIA FRACTURE SURGERY Left 1980      Allergies  Allergen Reactions   Iodinated Contrast Media Other (See Comments), Anaphylaxis, Hives and Rash    Reaction:  Redness  Other reaction(s): Other (See Comments) Reaction:  Redness  IVP DYE CAUSED REDNESS AND SOB.  DOES FINE WITH PREMED. LBW 4/15 Reaction:  Redness     Iodine Hives    Has to have prep to use   Other Anaphylaxis    Iv contrast dye   Cephalexin Hives and Rash    Patient has  tolerated cefepime previously (Nov 2020 admission)    Rosuvastatin     Other reaction(s): Other (See Comments), Other (See Comments) Severe leg cramps Severe leg cramps    Moxifloxacin Hcl    Acetaminophen-Codeine Palpitations, Hives and Rash    Per pt, she is able to take tylenol, as well as hydrocodone & Norco. Pt reports only unable to tolerate tylenol #3    Codeine Nausea Only   Ibuprofen Rash   Sulfa Antibiotics Anxiety    Other reaction(s): Other (See Comments), Other (See Comments) anxiety anxiety     Current Outpatient Medications  Medication Sig Dispense Refill   acetaminophen (TYLENOL) 325 MG tablet Take by mouth.     albuterol (VENTOLIN HFA) 108 (90 Base) MCG/ACT inhaler Inhale 1-2 puffs into the lungs every 6 (six) hours as needed for wheezing or  shortness of breath.     aspirin 81 MG chewable tablet Chew by mouth.     atorvastatin (LIPITOR) 10 MG tablet Take 1 tablet (10 mg total) by mouth daily. 90 tablet 0   ATROVENT HFA 17 MCG/ACT inhaler Inhale 2 puffs into the lungs in the morning, at noon, in the evening, and at bedtime.     b complex vitamins tablet Take 1 tablet by mouth daily.     BD PEN NEEDLE MICRO U/F 32G X 6 MM MISC SMARTSIG:1 SUB-Q 5 Times Daily     Blood Glucose Monitoring Suppl (ONE TOUCH ULTRA 2) w/Device KIT SMARTSIG:1 Kit(s) Via Meter Every Morning     cabergoline (DOSTINEX) 0.5 MG tablet Take 0.5 mg by mouth 3 (three) times a week.     cetirizine (ZYRTEC) 10 MG tablet Take by mouth.     clopidogrel (PLAVIX) 75 MG tablet Take 1 tablet (75 mg total) by mouth daily. 90 tablet 0   cyclobenzaprine (FLEXERIL) 10 MG tablet Take 10 mg by mouth 3 (three) times daily as needed for muscle spasms.     docusate sodium (COLACE) 100 MG capsule Take 1 capsule (100 mg total) by mouth daily. 10 capsule 0   ezetimibe (ZETIA) 10 MG tablet Take 10 mg by mouth daily.     fentaNYL (DURAGESIC) 12 MCG/HR USE 1 PATCH EVERY 72 HOURS FOR BACK PAIN     fluticasone furoate-vilanterol (BREO ELLIPTA) 100-25 MCG/ACT AEPB Inhale into the lungs.     furosemide (LASIX) 40 MG tablet Take 40 mg by mouth daily.     gabapentin (NEURONTIN) 800 MG tablet Take 800 mg by mouth 3 (three) times daily.     HUMALOG KWIKPEN 100 UNIT/ML KwikPen SMARTSIG:5 Unit(s) SUB-Q     ipratropium-albuterol (DUONEB) 0.5-2.5 (3) MG/3ML SOLN Inhale into the lungs.     LANTUS SOLOSTAR 100 UNIT/ML Solostar Pen Inject 35 Units into the skin daily at 12 noon.     levothyroxine (SYNTHROID) 137 MCG tablet Take 137 mcg by mouth daily before breakfast.     Multiple Vitamins-Minerals (OCUVITE EYE HEATLH GUMMIES PO) Take 2 each by mouth daily.     naloxone (NARCAN) 2 MG/2ML injection 2 mg into each nostril once as needed (overdose).     omeprazole (PRILOSEC) 20 MG capsule Take 20 mg by  mouth daily as needed (for acid reflux).     ondansetron (ZOFRAN) 4 MG tablet SMARTSIG:1 Tablet(s) By Mouth Every 12 Hours     ONETOUCH ULTRA test strip USE TO CHECK BLOOD SUGAR 3 TIMES A DAY     oxyCODONE-acetaminophen (PERCOCET/ROXICET) 5-325 MG tablet Take 2 tablets by mouth every 6 (six) hours  as needed for severe pain or moderate pain. 10 tablet 0   OXYGEN 2 L.     predniSONE (DELTASONE) 10 MG tablet Take 1 tablet (10 mg total) by mouth daily.     PREMARIN vaginal cream Place 0.5 g vaginally 2 (two) times a week.     spironolactone (ALDACTONE) 25 MG tablet Take 25 mg by mouth daily.     ULTICARE MINI PEN NEEDLES 31G X 6 MM MISC USE 5 TIMES A DAY AS DIRECTED     umeclidinium bromide (INCRUSE ELLIPTA) 62.5 MCG/ACT AEPB Inhale into the lungs.     venlafaxine XR (EFFEXOR-XR) 75 MG 24 hr capsule Take 75 mg by mouth daily.     Vitamin D, Ergocalciferol, (DRISDOL) 1.25 MG (50000 UNIT) CAPS capsule Take 50,000 Units by mouth 2 (two) times a week.     No current facility-administered medications for this visit.        Physical Exam Resp 16   Wt 184 lb 12.8 oz (83.8 kg)   LMP 12/10/1987   BMI 26.90 kg/m  Gen:  WD/WN, NAD Skin: incision C/D/I     Assessment/Plan:  Atherosclerosis of native arteries of the extremities with ulceration (HCC) Perfusion appears to be much better and the dry scabs are already starting to heal.  Her capillary refill is better.  She is no longer having rest pain.  We will have her come back in about 3 to 4 weeks to recheck her wound and check ABIs.  Continue current medical regimen.  Type 2 diabetes mellitus without complication, with long-term current use of insulin (HCC) blood glucose control important in reducing the progression of atherosclerotic disease. Also, involved in wound healing. On appropriate medications.      [name] 06/18/2022, 10:28 AM   This note was created with Dragon medical transcription system.  Any errors from dictation are  unintentional.

## 2022-06-18 NOTE — Assessment & Plan Note (Signed)
blood glucose control important in reducing the progression of atherosclerotic disease. Also, involved in wound healing. On appropriate medications.  

## 2022-07-15 ENCOUNTER — Other Ambulatory Visit (INDEPENDENT_AMBULATORY_CARE_PROVIDER_SITE_OTHER): Payer: Self-pay | Admitting: Vascular Surgery

## 2022-07-15 DIAGNOSIS — Z95820 Peripheral vascular angioplasty status with implants and grafts: Secondary | ICD-10-CM

## 2022-07-15 DIAGNOSIS — I70249 Atherosclerosis of native arteries of left leg with ulceration of unspecified site: Secondary | ICD-10-CM

## 2022-07-16 ENCOUNTER — Encounter (INDEPENDENT_AMBULATORY_CARE_PROVIDER_SITE_OTHER): Payer: Medicare Other

## 2022-07-16 ENCOUNTER — Ambulatory Visit (INDEPENDENT_AMBULATORY_CARE_PROVIDER_SITE_OTHER): Payer: Medicare Other | Admitting: Nurse Practitioner

## 2022-07-26 ENCOUNTER — Emergency Department: Payer: Medicare Other

## 2022-07-26 ENCOUNTER — Other Ambulatory Visit: Payer: Self-pay

## 2022-07-26 ENCOUNTER — Ambulatory Visit (INDEPENDENT_AMBULATORY_CARE_PROVIDER_SITE_OTHER): Payer: Medicare Other

## 2022-07-26 ENCOUNTER — Inpatient Hospital Stay
Admission: EM | Admit: 2022-07-26 | Discharge: 2022-07-29 | DRG: 190 | Disposition: A | Discharge status: Home Health | Payer: Medicare Other | Attending: Internal Medicine | Admitting: Internal Medicine

## 2022-07-26 DIAGNOSIS — Z9981 Dependence on supplemental oxygen: Secondary | ICD-10-CM

## 2022-07-26 DIAGNOSIS — K219 Gastro-esophageal reflux disease without esophagitis: Secondary | ICD-10-CM | POA: Diagnosis present

## 2022-07-26 DIAGNOSIS — Z20822 Contact with and (suspected) exposure to covid-19: Secondary | ICD-10-CM | POA: Diagnosis present

## 2022-07-26 DIAGNOSIS — Z91041 Radiographic dye allergy status: Secondary | ICD-10-CM | POA: Diagnosis not present

## 2022-07-26 DIAGNOSIS — N1831 Chronic kidney disease, stage 3a: Secondary | ICD-10-CM | POA: Diagnosis present

## 2022-07-26 DIAGNOSIS — E039 Hypothyroidism, unspecified: Secondary | ICD-10-CM

## 2022-07-26 DIAGNOSIS — Z885 Allergy status to narcotic agent status: Secondary | ICD-10-CM | POA: Diagnosis not present

## 2022-07-26 DIAGNOSIS — E1122 Type 2 diabetes mellitus with diabetic chronic kidney disease: Secondary | ICD-10-CM | POA: Diagnosis present

## 2022-07-26 DIAGNOSIS — Z882 Allergy status to sulfonamides status: Secondary | ICD-10-CM

## 2022-07-26 DIAGNOSIS — D75838 Other thrombocytosis: Secondary | ICD-10-CM | POA: Diagnosis present

## 2022-07-26 DIAGNOSIS — Z881 Allergy status to other antibiotic agents status: Secondary | ICD-10-CM

## 2022-07-26 DIAGNOSIS — Z7902 Long term (current) use of antithrombotics/antiplatelets: Secondary | ICD-10-CM

## 2022-07-26 DIAGNOSIS — T380X5A Adverse effect of glucocorticoids and synthetic analogues, initial encounter: Secondary | ICD-10-CM | POA: Diagnosis present

## 2022-07-26 DIAGNOSIS — I130 Hypertensive heart and chronic kidney disease with heart failure and stage 1 through stage 4 chronic kidney disease, or unspecified chronic kidney disease: Secondary | ICD-10-CM | POA: Diagnosis present

## 2022-07-26 DIAGNOSIS — I5030 Unspecified diastolic (congestive) heart failure: Secondary | ICD-10-CM | POA: Diagnosis present

## 2022-07-26 DIAGNOSIS — Z95820 Peripheral vascular angioplasty status with implants and grafts: Secondary | ICD-10-CM | POA: Diagnosis not present

## 2022-07-26 DIAGNOSIS — E785 Hyperlipidemia, unspecified: Secondary | ICD-10-CM | POA: Diagnosis present

## 2022-07-26 DIAGNOSIS — I70249 Atherosclerosis of native arteries of left leg with ulceration of unspecified site: Secondary | ICD-10-CM

## 2022-07-26 DIAGNOSIS — I252 Old myocardial infarction: Secondary | ICD-10-CM

## 2022-07-26 DIAGNOSIS — I5032 Chronic diastolic (congestive) heart failure: Secondary | ICD-10-CM | POA: Diagnosis present

## 2022-07-26 DIAGNOSIS — I5033 Acute on chronic diastolic (congestive) heart failure: Secondary | ICD-10-CM | POA: Diagnosis not present

## 2022-07-26 DIAGNOSIS — Z7951 Long term (current) use of inhaled steroids: Secondary | ICD-10-CM

## 2022-07-26 DIAGNOSIS — J9620 Acute and chronic respiratory failure, unspecified whether with hypoxia or hypercapnia: Secondary | ICD-10-CM | POA: Diagnosis present

## 2022-07-26 DIAGNOSIS — E1165 Type 2 diabetes mellitus with hyperglycemia: Secondary | ICD-10-CM

## 2022-07-26 DIAGNOSIS — N183 Chronic kidney disease, stage 3 unspecified: Secondary | ICD-10-CM

## 2022-07-26 DIAGNOSIS — J9622 Acute and chronic respiratory failure with hypercapnia: Secondary | ICD-10-CM | POA: Diagnosis present

## 2022-07-26 DIAGNOSIS — Z886 Allergy status to analgesic agent status: Secondary | ICD-10-CM | POA: Diagnosis not present

## 2022-07-26 DIAGNOSIS — E1151 Type 2 diabetes mellitus with diabetic peripheral angiopathy without gangrene: Secondary | ICD-10-CM | POA: Diagnosis present

## 2022-07-26 DIAGNOSIS — Z794 Long term (current) use of insulin: Secondary | ICD-10-CM | POA: Diagnosis not present

## 2022-07-26 DIAGNOSIS — F17210 Nicotine dependence, cigarettes, uncomplicated: Secondary | ICD-10-CM | POA: Diagnosis present

## 2022-07-26 DIAGNOSIS — E119 Type 2 diabetes mellitus without complications: Secondary | ICD-10-CM | POA: Diagnosis not present

## 2022-07-26 DIAGNOSIS — J441 Chronic obstructive pulmonary disease with (acute) exacerbation: Secondary | ICD-10-CM | POA: Diagnosis present

## 2022-07-26 DIAGNOSIS — J9621 Acute and chronic respiratory failure with hypoxia: Secondary | ICD-10-CM | POA: Diagnosis present

## 2022-07-26 DIAGNOSIS — E220 Acromegaly and pituitary gigantism: Secondary | ICD-10-CM | POA: Diagnosis present

## 2022-07-26 DIAGNOSIS — Z888 Allergy status to other drugs, medicaments and biological substances status: Secondary | ICD-10-CM | POA: Diagnosis not present

## 2022-07-26 DIAGNOSIS — J9601 Acute respiratory failure with hypoxia: Secondary | ICD-10-CM | POA: Diagnosis present

## 2022-07-26 DIAGNOSIS — Z79890 Hormone replacement therapy (postmenopausal): Secondary | ICD-10-CM

## 2022-07-26 DIAGNOSIS — Z79899 Other long term (current) drug therapy: Secondary | ICD-10-CM

## 2022-07-26 LAB — COMPREHENSIVE METABOLIC PANEL
ALT: 19 U/L (ref 0–44)
AST: 25 U/L (ref 15–41)
Albumin: 3.7 g/dL (ref 3.5–5.0)
Alkaline Phosphatase: 120 U/L (ref 38–126)
Anion gap: 11 (ref 5–15)
BUN: 14 mg/dL (ref 8–23)
CO2: 24 mmol/L (ref 22–32)
Calcium: 9.3 mg/dL (ref 8.9–10.3)
Chloride: 103 mmol/L (ref 98–111)
Creatinine, Ser: 1.11 mg/dL — ABNORMAL HIGH (ref 0.44–1.00)
GFR, Estimated: 55 mL/min — ABNORMAL LOW (ref >60)
Glucose, Bld: 248 mg/dL — ABNORMAL HIGH (ref 70–99)
Potassium: 4.7 mmol/L (ref 3.5–5.1)
Sodium: 138 mmol/L (ref 135–145)
Total Bilirubin: 0.6 mg/dL (ref 0.3–1.2)
Total Protein: 6.9 g/dL (ref 6.5–8.1)

## 2022-07-26 LAB — CBC WITH DIFFERENTIAL/PLATELET
Abs Immature Granulocytes: 0.15 10*3/uL — ABNORMAL HIGH (ref 0.00–0.07)
Basophils Absolute: 0.1 10*3/uL (ref 0.0–0.1)
Basophils Relative: 0 %
Eosinophils Absolute: 0 10*3/uL (ref 0.0–0.5)
Eosinophils Relative: 0 %
HCT: 36.5 % (ref 36.0–46.0)
Hemoglobin: 11.9 g/dL — ABNORMAL LOW (ref 12.0–15.0)
Immature Granulocytes: 1 %
Lymphocytes Relative: 8 %
Lymphs Abs: 1.2 10*3/uL (ref 0.7–4.0)
MCH: 30.1 pg (ref 26.0–34.0)
MCHC: 32.6 g/dL (ref 30.0–36.0)
MCV: 92.2 fL (ref 80.0–100.0)
Monocytes Absolute: 0.5 10*3/uL (ref 0.1–1.0)
Monocytes Relative: 3 %
Neutro Abs: 12.7 10*3/uL — ABNORMAL HIGH (ref 1.7–7.7)
Neutrophils Relative %: 88 %
Platelets: 442 10*3/uL — ABNORMAL HIGH (ref 150–400)
RBC: 3.96 MIL/uL (ref 3.87–5.11)
RDW: 13.5 % (ref 11.5–15.5)
WBC: 14.7 10*3/uL — ABNORMAL HIGH (ref 4.0–10.5)
nRBC: 0 % (ref 0.0–0.2)

## 2022-07-26 LAB — RESP PANEL BY RT-PCR (FLU A&B, COVID) ARPGX2
Influenza A by PCR: NEGATIVE
Influenza B by PCR: NEGATIVE
SARS Coronavirus 2 by RT PCR: NEGATIVE

## 2022-07-26 LAB — TROPONIN I (HIGH SENSITIVITY)
Troponin I (High Sensitivity): 7 ng/L (ref <18)
Troponin I (High Sensitivity): 6 ng/L (ref <18)

## 2022-07-26 LAB — GLUCOSE, CAPILLARY: Glucose-Capillary: 258 mg/dL — ABNORMAL HIGH (ref 70–99)

## 2022-07-26 MED ORDER — IPRATROPIUM-ALBUTEROL 0.5-2.5 (3) MG/3ML IN SOLN
3.0000 mL | Freq: Four times a day (QID) | RESPIRATORY_TRACT | Status: DC
  Administered 2022-07-26 – 2022-07-29 (×10): 3 mL via RESPIRATORY_TRACT
  Filled 2022-07-26 (×11): qty 3

## 2022-07-26 MED ORDER — MOMETASONE FURO-FORMOTEROL FUM 200-5 MCG/ACT IN AERO
2.0000 | INHALATION_SPRAY | Freq: Two times a day (BID) | RESPIRATORY_TRACT | Status: DC
  Administered 2022-07-26 – 2022-07-27 (×2): 2 via RESPIRATORY_TRACT
  Filled 2022-07-26: qty 8.8

## 2022-07-26 MED ORDER — PREDNISONE 20 MG PO TABS: 40.0000 mg | ORAL_TABLET | Freq: Every day | ORAL | Status: DC

## 2022-07-26 MED ORDER — IPRATROPIUM-ALBUTEROL 0.5-2.5 (3) MG/3ML IN SOLN
3.0000 mL | Freq: Once | RESPIRATORY_TRACT | Status: AC
  Administered 2022-07-26: 3 mL via RESPIRATORY_TRACT
  Filled 2022-07-26: qty 3

## 2022-07-26 MED ORDER — DOXYCYCLINE HYCLATE 100 MG PO TABS
100.0000 mg | ORAL_TABLET | Freq: Two times a day (BID) | ORAL | Status: DC
  Administered 2022-07-26 – 2022-07-29 (×6): 100 mg via ORAL
  Filled 2022-07-26 (×6): qty 1

## 2022-07-26 MED ORDER — ALBUTEROL SULFATE (2.5 MG/3ML) 0.083% IN NEBU
2.5000 mg | INHALATION_SOLUTION | RESPIRATORY_TRACT | Status: DC | PRN
  Administered 2022-07-28: 2.5 mg via RESPIRATORY_TRACT

## 2022-07-26 MED ORDER — SODIUM CHLORIDE 0.45 % IV SOLN: INTRAVENOUS | Status: DC

## 2022-07-26 MED ORDER — ENOXAPARIN SODIUM 40 MG/0.4ML IJ SOSY
40.0000 mg | PREFILLED_SYRINGE | INTRAMUSCULAR | Status: DC
  Administered 2022-07-26 – 2022-07-28 (×3): 40 mg via SUBCUTANEOUS
  Filled 2022-07-26 (×3): qty 0.4

## 2022-07-26 MED ORDER — METHYLPREDNISOLONE SODIUM SUCC 40 MG IJ SOLR
40.0000 mg | Freq: Two times a day (BID) | INTRAMUSCULAR | Status: AC
  Administered 2022-07-26 – 2022-07-27 (×2): 40 mg via INTRAVENOUS
  Filled 2022-07-26 (×2): qty 1

## 2022-07-26 NOTE — ED Provider Notes (Signed)
[facility] Provider Note    Event Date/Time   First MD Initiated Contact with Patient 07/26/22 1529     (approximate)   History   Shortness of Breath   HPI  Alison Taylor is a 66 y.o. female smoker with a history of COPD who began getting short of breath last night came in today for an appointment and the vascular clinic and then got short of breath living.  She comes in after having a DuoNeb and 125 Solu-Medrol IV.  She still wheezing and short of breath.  Initial pulse ox was 77% on room air      Physical Exam   Triage Vital Signs: ED Triage Vitals  Enc Vitals Group     BP      Pulse      Resp      Temp      Temp src      SpO2      Weight      Height      Head Circumference      Peak Flow      Pain Score      Pain Loc      Pain Edu?      Excl. in [place]?     Most recent vital signs: Vitals:   07/26/22 1538 07/26/22 1630  BP: (!) 127/53 (!) 128/52  Pulse: 100 97  Resp: (!) 22 (!) 26  Temp: 98.9 F (37.2 C)   SpO2: 94% 97%     General: Awake, some respiratory distress CV:  Good peripheral perfusion.  Heart regular rate and rhythm no audible murmurs Resp:  Increased effort slight retractions some wheezing diffusely Abd:  No distention.  Soft and nontender Extremities with no edema   ED Results / Procedures / Treatments   Labs (all labs ordered are listed, but only abnormal results are displayed) Labs Reviewed  COMPREHENSIVE METABOLIC PANEL - Abnormal; Notable for the following components:      Result Value   Glucose, Bld 248 (*)    Creatinine, Ser 1.11 (*)    GFR, Estimated 55 (*)    All other components within normal limits  CBC WITH DIFFERENTIAL/PLATELET - Abnormal; Notable for the following components:   WBC 14.7 (*)    Hemoglobin 11.9 (*)    Platelets 442 (*)    Neutro Abs 12.7 (*)    Abs Immature Granulocytes 0.15 (*)    All other components within normal limits  TROPONIN I (HIGH SENSITIVITY)  TROPONIN I (HIGH  SENSITIVITY)     EKG  EKG read interpreted by me shows normal sinus rhythm rate of 99 left axis decreased R wave progression no acute ST-T wave changes   RADIOLOGY Chest x-ray read by radiology reviewed by me and interpreted by me shows a nodule in the left upper lung field.  CT is recommended to check on this.  We will do it now since she is here.  PROCEDURES:  Critical Care performed:   Procedures   MEDICATIONS ORDERED IN ED: Medications  ipratropium-albuterol (DUONEB) 0.5-2.5 (3) MG/3ML nebulizer solution 3 mL (3 mLs Nebulization Given 07/26/22 1544)  ipratropium-albuterol (DUONEB) 0.5-2.5 (3) MG/3ML nebulizer solution 3 mL (3 mLs Nebulization Given 07/26/22 1637)  ipratropium-albuterol (DUONEB) 0.5-2.5 (3) MG/3ML nebulizer solution 3 mL (3 mLs Nebulization Given 07/26/22 1714)     IMPRESSION / MDM / ASSESSMENT AND PLAN / ED COURSE  I reviewed the triage vital signs and the nursing notes. After having 3  DuoNebs here and 1 in the ambulance and Solu-Medrol 125 IV in the ambulance we tried to walk patient around she gets very short of breath and in fact gets dizzy and has to sit down.  She is not having any chest pain.  O2 sats only went down to 90 or 91 but again she is very dyspneic.  Differential diagnosis includes, but is not limited to, pneumonia CHF COPD exacerbation pneumothorax  Patient's presentation is most consistent with acute presentation with potential threat to life or bodily function.  The patient is on the cardiac monitor to evaluate for evidence of arrhythmia and/or significant heart rate changes.  None have been seen      FINAL CLINICAL IMPRESSION(S) / ED DIAGNOSES   Final diagnoses:  COPD exacerbation ([place])     Rx / DC Orders   ED Discharge Orders     None        Note:  This document was prepared using Dragon voice recognition software and may include unintentional dictation errors.   Nena Polio, MD 07/26/22 1810

## 2022-07-26 NOTE — ED Triage Notes (Signed)
Pt arrived by EMS. States SOB began last night. O2 70% when EMS arrived. Improved to 80s, 90s. Meds administered by EMS. Currently 95% on RA. Hx of COPD. Labored breathing.

## 2022-07-26 NOTE — ED Notes (Signed)
Pt. Up to toilet with stand by assist and O2 tank in carrier. Pt. Will use call light when she is ready to get back to bed.pt. verbalizes understanding.

## 2022-07-26 NOTE — ED Notes (Signed)
Patient ambulated in room on RA. Upon exertion, patient dropped down to 91% with labored breathing and complaining of dizziness. Patient assisted into stretcher with 2L [place] applied. Cinda Quest, MD aware.

## 2022-07-26 NOTE — H&P (Signed)
History and Physical    Patient: Alison Taylor QTM:[id number] DOB: [birth year] DOA: 07/26/2022 DOS: the patient was seen and examined on 07/26/2022 PCP: Philmore Pali, NP  Patient coming from: Home  Chief Complaint:  Chief Complaint  Patient presents with   Shortness of Breath   HPI: Alison Taylor is a 66 y.o. female with medical history significant of COPD, heart failure with preserved ejection fraction, GERD, hyperlipidemia, essential hypertension, hypothyroidism, diabetes type 2 and history of pituitary adenoma who presented to the ER with significant shortness of breath cough for the last 2 days.  Patient is on home O2 but uses 2 L at night and as needed during the day.  In the last few days she has been using it daily.  In the ER here she was also found to be wheezing and hypoxic.  She was requiring full 2 L of oxygen.  Patient received initial treatment with steroids and nebulizer treatment in the ER but still having significant symptoms.  She was at the vascular clinic apparently yesterday when she started getting short of breath.  She was 77% on room air.  Patient subsequently admitted for treatment of acute on chronic respiratory failure with hypoxia secondary to COPD exacerbation.  Review of Systems: As mentioned in the history of present illness. All other systems reviewed and are negative. Past Medical History:  Diagnosis Date   (HFpEF) heart failure with preserved ejection fraction ([place])    a.) TTE 03/17/2020: EF >55%, no RWMAs, norm RVSF; b.) TTE 12/19/2020: EF >55%, mild LVH, mild RAE, mild MAC, triv MR/TR/PR, G1DD; c.) TTE 12/16/2021: EF >55%, mod LVD, G1DD.   Acromegalia ([place])    a.) on cabergoline   Anemia    Anxiety    Aortic atherosclerosis ([place])    Arthritis    Chronic prescription opiate use    a.) TD fentanyl + oxy/APAP; b.) has naloxone Rx   Chronic respiratory failure with hypoxia, on home oxygen therapy ([place])    a.) chronic supplemental oxygen at 2L/[place]   COPD  (chronic obstructive pulmonary disease) (HCC)    GERD (gastroesophageal reflux disease)    HLD (hyperlipidemia)    Hypertension    Hypothyroidism    Lumbosacral spinal stenosis    Myalgia due to statin    PAD (peripheral artery disease) ([place]) 05/06/2022   a.) PVC 05/06/2022 --> 60-70% RCIA, occlusion LCIA, 50% RFA   Palpitations    Paresthesias (hands)    Pituitary adenoma (HCC)    Tobacco abuse    Type 2 diabetes mellitus treated with insulin (HCC)    Type 2 MI (myocardial infarction) ([place]) 10/31/2020   a.) troponins 102 --> 304 --> 458; etiology felt to be secondary to AECOPD   Vitamin D deficiency    Past Surgical History:  Procedure Laterality Date   ABDOMINAL HYSTERECTOMY  1989   CHOLECYSTECTOMY     ENDARTERECTOMY FEMORAL Left 06/02/2022   Procedure: ENDARTERECTOMY FEMORAL;  Surgeon: Algernon Huxley, MD;  Location: [facility] ORS;  Service: Vascular;  Laterality: Left;   INSERTION OF ILIAC STENT Bilateral 06/02/2022   Procedure: INSERTION OF ILIAC STENT;  Surgeon: Algernon Huxley, MD;  Location: [facility] ORS;  Service: Vascular;  Laterality: Bilateral;   LOWER EXTREMITY ANGIOGRAPHY Left 05/06/2022   Procedure: Lower Extremity Angiography;  Surgeon: Algernon Huxley, MD;  Location: [facility];  Service: Cardiovascular;  Laterality: Left;   NECK SURGERY  2002   OVARIAN CYST SURGERY Left 1983   TIBIA FRACTURE  SURGERY Left 1980   Social History:  reports that she has been smoking cigarettes. She has a 22.50 pack-year smoking history. She has never used smokeless tobacco. She reports that she does not drink alcohol and does not use drugs.  Allergies  Allergen Reactions   Iodinated Contrast Media Other (See Comments), Anaphylaxis, Hives and Rash    Reaction:  Redness  Other reaction(s): Other (See Comments) Reaction:  Redness  IVP DYE CAUSED REDNESS AND SOB.  DOES FINE WITH PREMED. LBW 4/15 Reaction:  Redness     Iodine Hives    Has to have prep to use   Other Anaphylaxis    Iv  contrast dye   Cephalexin Hives and Rash    Patient has tolerated cefepime previously (Nov 2020 admission)    Rosuvastatin     Other reaction(s): Other (See Comments), Other (See Comments) Severe leg cramps Severe leg cramps    Moxifloxacin Hcl    Acetaminophen-Codeine Palpitations, Hives and Rash    Per pt, she is able to take tylenol, as well as hydrocodone & Norco. Pt reports only unable to tolerate tylenol #3    Codeine Nausea Only   Ibuprofen Rash   Sulfa Antibiotics Anxiety    Other reaction(s): Other (See Comments), Other (See Comments) anxiety anxiety     Family History  Problem Relation Age of Onset   ALS Brother    Breast cancer Neg Hx     Prior to Admission medications   Medication Sig Start Date End Date Taking? Authorizing Provider  atorvastatin (LIPITOR) 10 MG tablet Take 1 tablet (10 mg total) by mouth daily. 06/06/22  Yes Esco, Miechia A, MD  b complex vitamins tablet Take 1 tablet by mouth daily.   Yes [provider]  cetirizine (ZYRTEC) 10 MG tablet Take 10 mg by mouth daily.   Yes [provider]  clopidogrel (PLAVIX) 75 MG tablet Take 1 tablet (75 mg total) by mouth daily. 06/06/22  Yes Esco, Miechia A, MD  cyclobenzaprine (FLEXERIL) 10 MG tablet Take 10 mg by mouth 3 (three) times daily as needed for muscle spasms.   Yes [provider]  ezetimibe (ZETIA) 10 MG tablet Take 10 mg by mouth daily. 05/09/21  Yes [provider]  fluticasone furoate-vilanterol (BREO ELLIPTA) 100-25 MCG/ACT AEPB Inhale into the lungs. 12/22/21  Yes [provider]  furosemide (LASIX) 40 MG tablet Take 40 mg by mouth daily. 02/06/22  Yes [provider]  gabapentin (NEURONTIN) 800 MG tablet Take 800 mg by mouth 3 (three) times daily.   Yes [provider]  HUMALOG KWIKPEN 100 UNIT/ML KwikPen Inject 0-5 Units into the skin 3 (three) times daily. 11/30/21  Yes [provider]  ipratropium-albuterol (DUONEB) 0.5-2.5  (3) MG/3ML SOLN Inhale 3 mLs into the lungs every 4 (four) hours as needed (wheezing/shortness of breath).   Yes [provider]  LANTUS SOLOSTAR 100 UNIT/ML Solostar Pen Inject 35 Units into the skin daily at 12 noon. 06/09/21  Yes [provider]  levothyroxine (SYNTHROID) 125 MCG tablet Take 125 mcg by mouth daily before breakfast.   Yes [provider]  Multiple Vitamins-Minerals ([place]) Take 2 each by mouth daily.   Yes [provider]  omeprazole (PRILOSEC) 20 MG capsule Take 20 mg by mouth daily as needed (for acid reflux).   Yes [provider]  ondansetron (ZOFRAN) 4 MG tablet Take 4 mg by mouth 2 (two) times daily. 04/06/22  Yes [provider]  oxyCODONE-acetaminophen (PERCOCET/ROXICET) 5-325 MG tablet Take 2 tablets by mouth every 6 (six) hours as needed for severe pain or moderate pain. Patient taking differently: Take 1 tablet by mouth every 6 (six) hours as needed for severe pain or moderate pain. 06/05/22  Yes Esco, Miechia A, MD  predniSONE (DELTASONE) 10 MG tablet Take 1 tablet (10 mg total) by mouth daily. 07/05/21  Yes Dahal, Marlowe Aschoff, MD  PREMARIN vaginal cream Place 0.5 g vaginally 2 (two) times a week. 05/15/21  Yes [provider]  spironolactone (ALDACTONE) 25 MG tablet Take 25 mg by mouth daily. 03/30/22  Yes [provider]  umeclidinium bromide (INCRUSE ELLIPTA) 62.5 MCG/ACT AEPB Inhale into the lungs. 12/22/21  Yes [provider]  acetaminophen (TYLENOL) 325 MG tablet Take 650 mg by mouth every 6 (six) hours as needed for mild pain. 08/22/21   [provider]  albuterol (VENTOLIN HFA) 108 (90 Base) MCG/ACT inhaler Inhale 1-2 puffs into the lungs every 6 (six) hours as needed for wheezing or shortness of breath.    [provider]  aspirin 81 MG chewable tablet Chew by mouth. Patient not taking: Reported on 07/26/2022 12/22/21   [provider]  ATROVENT  HFA 17 MCG/ACT inhaler Inhale 2 puffs into the lungs in the morning, at noon, in the evening, and at bedtime. 03/18/21   [provider]  BD PEN NEEDLE MICRO U/F 32G X 6 MM MISC SMARTSIG:1 SUB-Q 5 Times Daily 07/21/21   [provider]  Blood Glucose Monitoring Suppl (ONE TOUCH ULTRA 2) w/Device KIT SMARTSIG:1 Kit(s) Via Meter Every Morning 05/06/21   [provider]  cabergoline (DOSTINEX) 0.5 MG tablet Take 0.5 mg by mouth 3 (three) times a week. 03/23/21   [provider]  docusate sodium (COLACE) 100 MG capsule Take 1 capsule (100 mg total) by mouth daily. 06/06/22   Esco, Miechia A, MD  fentaNYL (DURAGESIC) 12 MCG/HR USE 1 PATCH EVERY 72 HOURS FOR BACK PAIN Patient not taking: Reported on 07/26/2022 01/22/22   [provider]  naloxone [facility]) 2 MG/2ML injection 2 mg into each nostril once as needed (overdose).    [provider]  ONETOUCH ULTRA test strip USE TO CHECK BLOOD SUGAR 3 TIMES A DAY 06/07/21   [provider]  OXYGEN 2 L.    [provider]  ULTICARE MINI PEN NEEDLES 31G X 6 MM MISC USE 5 TIMES A DAY AS DIRECTED 04/28/21   [provider]  venlafaxine XR (EFFEXOR-XR) 75 MG 24 hr capsule Take 75 mg by mouth daily. Patient not taking: Reported on 07/26/2022 06/14/21   [provider]  Vitamin D, Ergocalciferol, (DRISDOL) 1.25 MG (50000 UNIT) CAPS capsule Take 50,000 Units by mouth 2 (two) times a week. 05/19/21   [provider]    Physical Exam: Vitals:   07/26/22 1530 07/26/22 1538 07/26/22 1630 07/26/22 1807  BP:  (!) 127/53 (!) 128/52 (!) 144/69  Pulse:  100 97 (!) 103  Resp:  (!) 22 (!) 26 (!) 22  Temp:  98.9 F (37.2 C)    TempSrc:  Oral    SpO2: (!) 70% 94% 97% 92%   Constitutional: NAD, calm, comfortable Eyes: PERRL, lids and conjunctivae normal ENMT: Mucous membranes are moist. Posterior pharynx clear of any exudate or lesions.Normal dentition.  Neck: normal, supple, no masses, no  thyromegaly Respiratory: Decreased air entry bilaterally with marked expiratory wheezing, no crackles, normal respiratory effort. No accessory muscle use.  Cardiovascular: Regular  rate and rhythm, no murmurs / rubs / gallops. No extremity edema. 2+ pedal pulses. No carotid bruits.  Abdomen: no tenderness, no masses palpated. No hepatosplenomegaly. Bowel sounds positive.  Musculoskeletal: Good range of motion, no joint swelling or tenderness, Skin: no rashes, lesions, ulcers. No induration Neurologic: CN 2-12 grossly intact. Sensation intact, DTR normal. Strength 5/5 in all 4.  Psychiatric: Normal judgment and insight. Alert and oriented x 3. Normal mood   Data Reviewed:  Glucose 248, creatinine 1.11, white count 14.7, platelets 442, acute respiratory panel negative.  Chest CT without contrast shows some mild pulmonary nodule in particular in the left upper lobe but no acute findings.  Chest x-ray shows showed possible 2.1 cm nodule in the left upper lobe.  Assessment and Plan:  #1 acute on chronic respiratory failure with hypoxemia: Secondary to COPD exacerbation.  Patient will be admitted.  IV steroid, nebulizer, antibiotics and supportive care.  Follow the gold protocol.  #2 acute exacerbation of COPD: Continue oxygenation.  Continue treatment as above  #3 heart failure with preserved ejection fraction: Stable.  No evidence of CHF exacerbation.  #4 type 2 diabetes: Initiate sliding scale insulin.  Hold oral hypoglycemics  #5 hypothyroidism: Continue with levothyroxine  #6 tobacco abuse: Counseling provided.  Offered nicotine patch.    Advance Care Planning:   Code Status: Prior full code  Consults: None  Family Communication: No family at bedside  Severity of Illness: The appropriate patient status for this patient is OBSERVATION. Observation status is judged to be reasonable and necessary in order to provide the required intensity of service to ensure the patient's safety. The  patient's presenting symptoms, physical exam findings, and initial radiographic and laboratory data in the context of their medical condition is felt to place them at decreased risk for further clinical deterioration. Furthermore, it is anticipated that the patient will be medically stable for discharge from the hospital within 2 midnights of admission.   AuthorBarbette Merino, MD 07/26/2022 7:09 PM  For on call review [email or web address].

## 2022-07-26 NOTE — ED Notes (Signed)
Pt place on 2L O2 by Cinda Quest, MD

## 2022-07-27 DIAGNOSIS — I5032 Chronic diastolic (congestive) heart failure: Secondary | ICD-10-CM

## 2022-07-27 DIAGNOSIS — D75838 Other thrombocytosis: Secondary | ICD-10-CM

## 2022-07-27 DIAGNOSIS — E1165 Type 2 diabetes mellitus with hyperglycemia: Secondary | ICD-10-CM

## 2022-07-27 DIAGNOSIS — J441 Chronic obstructive pulmonary disease with (acute) exacerbation: Secondary | ICD-10-CM | POA: Diagnosis not present

## 2022-07-27 DIAGNOSIS — J9621 Acute and chronic respiratory failure with hypoxia: Secondary | ICD-10-CM | POA: Diagnosis not present

## 2022-07-27 LAB — COMPREHENSIVE METABOLIC PANEL
ALT: 17 U/L (ref 0–44)
AST: 16 U/L (ref 15–41)
Albumin: 3.4 g/dL — ABNORMAL LOW (ref 3.5–5.0)
Alkaline Phosphatase: 104 U/L (ref 38–126)
Anion gap: 10 (ref 5–15)
BUN: 19 mg/dL (ref 8–23)
CO2: 23 mmol/L (ref 22–32)
Calcium: 9.3 mg/dL (ref 8.9–10.3)
Chloride: 106 mmol/L (ref 98–111)
Creatinine, Ser: 1.07 mg/dL — ABNORMAL HIGH (ref 0.44–1.00)
GFR, Estimated: 58 mL/min — ABNORMAL LOW (ref >60)
Glucose, Bld: 297 mg/dL — ABNORMAL HIGH (ref 70–99)
Potassium: 4.3 mmol/L (ref 3.5–5.1)
Sodium: 139 mmol/L (ref 135–145)
Total Bilirubin: 0.2 mg/dL — ABNORMAL LOW (ref 0.3–1.2)
Total Protein: 6.3 g/dL — ABNORMAL LOW (ref 6.5–8.1)

## 2022-07-27 LAB — GLUCOSE, CAPILLARY
Glucose-Capillary: 225 mg/dL — ABNORMAL HIGH (ref 70–99)
Glucose-Capillary: 215 mg/dL — ABNORMAL HIGH (ref 70–99)
Glucose-Capillary: 170 mg/dL — ABNORMAL HIGH (ref 70–99)

## 2022-07-27 LAB — CBC
HCT: 34 % — ABNORMAL LOW (ref 36.0–46.0)
Hemoglobin: 11.2 g/dL — ABNORMAL LOW (ref 12.0–15.0)
MCH: 30 pg (ref 26.0–34.0)
MCHC: 32.9 g/dL (ref 30.0–36.0)
MCV: 91.2 fL (ref 80.0–100.0)
Platelets: 423 10*3/uL — ABNORMAL HIGH (ref 150–400)
RBC: 3.73 MIL/uL — ABNORMAL LOW (ref 3.87–5.11)
RDW: 13.5 % (ref 11.5–15.5)
WBC: 14.8 10*3/uL — ABNORMAL HIGH (ref 4.0–10.5)
nRBC: 0 % (ref 0.0–0.2)

## 2022-07-27 MED ORDER — CABERGOLINE 0.5 MG PO TABS: 0.5000 mg | ORAL_TABLET | ORAL | Status: DC

## 2022-07-27 MED ORDER — CYCLOBENZAPRINE HCL 10 MG PO TABS
10.0000 mg | ORAL_TABLET | Freq: Three times a day (TID) | ORAL | Status: DC | PRN
  Administered 2022-07-27 – 2022-07-28 (×3): 10 mg via ORAL
  Filled 2022-07-27 (×3): qty 1

## 2022-07-27 MED ORDER — BUPROPION HCL ER (SR) 150 MG PO TB12
150.0000 mg | ORAL_TABLET | Freq: Two times a day (BID) | ORAL | Status: DC
  Administered 2022-07-27 – 2022-07-29 (×5): 150 mg via ORAL
  Filled 2022-07-27 (×5): qty 1

## 2022-07-27 MED ORDER — ENSURE MAX PROTEIN PO LIQD
11.0000 [oz_av] | Freq: Two times a day (BID) | ORAL | Status: DC
  Administered 2022-07-27 – 2022-07-28 (×3): 11 [oz_av] via ORAL
  Filled 2022-07-27: qty 330

## 2022-07-27 MED ORDER — LEVOTHYROXINE SODIUM 50 MCG PO TABS
125.0000 ug | ORAL_TABLET | Freq: Every day | ORAL | Status: DC
  Administered 2022-07-28 – 2022-07-29 (×2): 125 ug via ORAL
  Filled 2022-07-27 (×2): qty 1

## 2022-07-27 MED ORDER — CLOPIDOGREL BISULFATE 75 MG PO TABS
75.0000 mg | ORAL_TABLET | Freq: Every day | ORAL | Status: DC
  Administered 2022-07-27 – 2022-07-29 (×3): 75 mg via ORAL
  Filled 2022-07-27 (×3): qty 1

## 2022-07-27 MED ORDER — ACETAMINOPHEN 325 MG PO TABS
650.0000 mg | ORAL_TABLET | ORAL | Status: DC | PRN
  Administered 2022-07-27 – 2022-07-28 (×3): 650 mg via ORAL
  Filled 2022-07-27 (×3): qty 2

## 2022-07-27 MED ORDER — ADULT MULTIVITAMIN W/MINERALS CH
1.0000 | ORAL_TABLET | Freq: Every day | ORAL | Status: DC
  Administered 2022-07-28: 1 via ORAL
  Filled 2022-07-27: qty 1

## 2022-07-27 MED ORDER — FUROSEMIDE 40 MG PO TABS
40.0000 mg | ORAL_TABLET | Freq: Every day | ORAL | Status: DC
  Administered 2022-07-28 – 2022-07-29 (×2): 40 mg via ORAL
  Filled 2022-07-27 (×2): qty 1

## 2022-07-27 MED ORDER — PANTOPRAZOLE SODIUM 40 MG PO TBEC
40.0000 mg | DELAYED_RELEASE_TABLET | Freq: Every day | ORAL | Status: DC
  Administered 2022-07-27 – 2022-07-29 (×3): 40 mg via ORAL
  Filled 2022-07-27 (×3): qty 1

## 2022-07-27 MED ORDER — SPIRONOLACTONE 25 MG PO TABS
25.0000 mg | ORAL_TABLET | Freq: Every day | ORAL | Status: DC
  Administered 2022-07-28 – 2022-07-29 (×2): 25 mg via ORAL
  Filled 2022-07-27 (×2): qty 1

## 2022-07-27 MED ORDER — FLUTICASONE FUROATE-VILANTEROL 100-25 MCG/ACT IN AEPB
1.0000 | INHALATION_SPRAY | Freq: Every day | RESPIRATORY_TRACT | Status: DC
  Administered 2022-07-27 – 2022-07-29 (×3): 1 via RESPIRATORY_TRACT
  Filled 2022-07-27: qty 28

## 2022-07-27 MED ORDER — GABAPENTIN 400 MG PO CAPS
400.0000 mg | ORAL_CAPSULE | Freq: Two times a day (BID) | ORAL | Status: DC
  Administered 2022-07-27 – 2022-07-29 (×5): 400 mg via ORAL
  Filled 2022-07-27 (×5): qty 1

## 2022-07-27 MED ORDER — GABAPENTIN 800 MG PO TABS: 800.0000 mg | ORAL_TABLET | Freq: Three times a day (TID) | ORAL | Status: DC

## 2022-07-27 MED ORDER — OXYCODONE-ACETAMINOPHEN 5-325 MG PO TABS: 1.0000 | ORAL_TABLET | Freq: Four times a day (QID) | ORAL | Status: DC | PRN

## 2022-07-27 MED ORDER — EZETIMIBE 10 MG PO TABS
10.0000 mg | ORAL_TABLET | Freq: Every day | ORAL | Status: DC
  Administered 2022-07-27 – 2022-07-29 (×3): 10 mg via ORAL
  Filled 2022-07-27 (×3): qty 1

## 2022-07-27 MED ORDER — INSULIN GLARGINE-YFGN 100 UNIT/ML ~~LOC~~ SOLN
25.0000 [IU] | Freq: Every day | SUBCUTANEOUS | Status: DC
  Administered 2022-07-27 – 2022-07-28 (×2): 25 [IU] via SUBCUTANEOUS
  Filled 2022-07-27 (×3): qty 0.25

## 2022-07-27 MED ORDER — ATORVASTATIN CALCIUM 10 MG PO TABS
10.0000 mg | ORAL_TABLET | Freq: Every day | ORAL | Status: DC
  Administered 2022-07-27 – 2022-07-29 (×3): 10 mg via ORAL
  Filled 2022-07-27 (×3): qty 1

## 2022-07-27 MED ORDER — UMECLIDINIUM BROMIDE 62.5 MCG/ACT IN AEPB
1.0000 | INHALATION_SPRAY | Freq: Every day | RESPIRATORY_TRACT | Status: DC
  Administered 2022-07-27 – 2022-07-29 (×3): 1 via RESPIRATORY_TRACT
  Filled 2022-07-27: qty 7

## 2022-07-27 MED ORDER — INSULIN ASPART 100 UNIT/ML IJ SOLN
0.0000 [IU] | Freq: Three times a day (TID) | INTRAMUSCULAR | Status: DC
  Administered 2022-07-27 (×2): 3 [IU] via SUBCUTANEOUS
  Administered 2022-07-28: 2 [IU] via SUBCUTANEOUS
  Administered 2022-07-28: 5 [IU] via SUBCUTANEOUS
  Filled 2022-07-27 (×4): qty 1

## 2022-07-27 NOTE — Hospital Course (Addendum)
Alison Taylor is a 66 y.o. female with medical history significant of COPD, heart failure with preserved ejection fraction, GERD, hyperlipidemia, essential hypertension, hypothyroidism, diabetes type 2 and history of pituitary adenoma who presented to the ER with significant shortness of breath cough for the last 2 days.  Patient is on home O2 but uses 2 L at night and as needed during the day. Patient diagnosed with acute on chronic respiratory failure with COPD exacerbation.  Placed on steroids.

## 2022-07-27 NOTE — Evaluation (Signed)
Occupational Therapy Evaluation Patient Details Name: Alison Taylor MRN: [id number] DOB: May 15, 1956 Today's Date: 07/27/2022   History of Present Illness Alison Taylor is a 66 y.o. female with medical history significant of COPD, heart failure with preserved ejection fraction, GERD, hyperlipidemia, essential hypertension, hypothyroidism, diabetes type 2 and history of pituitary adenoma who presented to the ER with significant shortness of breath cough for the last 2 days.   Clinical Impression   Alison Taylor was seen for OT evaluation this date. Pt was independent/MOD I in all ADL and functional mobility, living in a 1 story home with roommates who are available to assist PRN. Pt on 2 liters of O2 at home with exertional activity/at night. Pt reports being able to go long periods without O2 if she is not active. Pt reports becoming easily fatigued or out of breath with minimal exertion over last several days. Pt currently requires MIN A for LB bathing, dressing, and functional mobility due to current functional impairments (See OT Problem List below). Pt educated in energy conservation strategies including pursed lip breathing, activity pacing, home/routines modifications, work simplification, AE/DME, prioritizing of meaningful occupations, and falls prevention. Pt verbalized understanding and would benefit from additional skilled OT services to maximize recall and carryover of learned techniques and facilitate implementation of learned techniques into daily routines. Upon discharge, recommend [place] services.         Recommendations for follow up therapy are one component of a multi-disciplinary discharge planning process, led by the attending physician.  Recommendations may be updated based on patient status, additional functional criteria and insurance authorization.   Follow Up Recommendations  Home health OT    Assistance Recommended at Discharge Intermittent Supervision/Assistance  Patient  can return home with the following A little help with walking and/or transfers;A little help with bathing/dressing/bathroom;Help with stairs or ramp for entrance;Assist for transportation;Assistance with cooking/housework    Functional Status Assessment  Patient has had a recent decline in their functional status and demonstrates the ability to make significant improvements in function in a reasonable and predictable amount of time.  Equipment Recommendations  BSC/3in1    Recommendations for Other Services       Precautions / Restrictions Precautions Precautions: Fall Restrictions Weight Bearing Restrictions: No      Mobility Bed Mobility Overal bed mobility: Needs Assistance Bed Mobility: Supine to Sit     Supine to sit: Modified independent (Device/Increase time), HOB elevated          Transfers                   General transfer comment: Deferred. Pt declines.      Balance Overall balance assessment: Needs assistance Sitting-balance support: Feet supported, No upper extremity supported Sitting balance-Leahy Scale: Good                                     ADL either performed or assessed with clinical judgement   ADL Overall ADL's : Needs assistance/impaired                                       General ADL Comments: Pt comes to sitting at EOB with supervision for safety. Able to maintain static sitting balance without support for 3+ minutes. Declines further mobility/activity 2/2 weakness and fatigue. Anticipate supervision to MIN A  for exertional tasks including LB dressing, bathing, and functional mobility.     Vision Baseline Vision/History: 1 Wears glasses Ability to See in Adequate Light: 1 Impaired Patient Visual Report: No change from baseline       Perception     Praxis      Pertinent Vitals/Pain Pain Assessment Pain Assessment: No/denies pain     Hand Dominance     Extremity/Trunk Assessment Upper  Extremity Assessment Upper Extremity Assessment: Generalized weakness   Lower Extremity Assessment Lower Extremity Assessment: Generalized weakness       Communication Communication Communication: No difficulties   Cognition Arousal/Alertness: Awake/alert Behavior During Therapy: WFL for tasks assessed/performed, Anxious Overall Cognitive Status: Within Functional Limits for tasks assessed                                 General Comments: Mildly anxious re: exertional activity. Refuses OOB this date.     General Comments  Vitals stable t/o session. Pt maintains O2 sats >/= 95% with bed mobility. HR noted to remain in 90's t/o session despite minimial exertion. Pt on 3L [place] t/o session.    Exercises Other Exercises Other Exercises: Pt educated on role of OT in acute setting, safe use of AE for ADL/IADL management and energy conservation, pursed lip breathing technique and activity pacing.   Shoulder Instructions      Home Living Family/patient expects to be discharged to:: Private residence Living Arrangements: Other relatives;Non-relatives/Friends (Pt reports she lives with "roommates") Available Help at Discharge: Family Type of Home: House Home Access: Stairs to enter Technical brewer of Steps: 4 Entrance Stairs-Rails: Left;Right;Can reach both Home Layout: Other (Comment) (1 step to get to [employer])     [employer] Shower/Tub: Occupational psychologist: Standard     Home Equipment: Cane - single point;Shower Land (2 wheels);Hand held shower head          Prior Functioning/Environment Prior Level of Function : Needs assist             Mobility Comments: MOD I with PRN cane since recent vascular sx. ADLs Comments: PRN assist for dressing/bathing from relatives, pt is typicallyMOD I per report; family assists with IADLs        OT Problem List: Decreased strength;Cardiopulmonary status limiting activity;Decreased activity  tolerance;Decreased safety awareness;Impaired balance (sitting and/or standing);Decreased knowledge of use of DME or AE      OT Treatment/Interventions: Self-care/ADL training;Therapeutic exercise;Therapeutic activities;DME and/or AE instruction;Patient/family education;Energy conservation;Balance training    OT Goals(Current goals can be found in the care plan section) Acute Rehab OT Goals Patient Stated Goal: To rest today OT Goal Formulation: With patient Time For Goal Achievement: 08/10/22 Potential to Achieve Goals: Good ADL Goals Pt Will Perform Grooming: with modified independence;standing (c LRAD PRN and min cueing for ECS.) Pt Will Perform Lower Body Dressing: sit to/from stand;with modified independence;with adaptive equipment (c LRAD PRN and min cueing for ECS.) Pt Will Transfer to Toilet: bedside commode;with modified independence;ambulating (c LRAD PRN and min cueing for ECS.) Pt Will Perform Toileting - Clothing Manipulation and hygiene: sit to/from stand;with modified independence (c LRAD PRN and min cueing for ECS.)  OT Frequency: Min 2X/week    Co-evaluation              AM-PAC OT "6 Clicks" Daily Activity     Outcome Measure Help from another person eating meals?: A Little Help from another person  taking care of personal grooming?: A Little Help from another person toileting, which includes using toliet, bedpan, or urinal?: A Little Help from another person bathing (including washing, rinsing, drying)?: A Little Help from another person to put on and taking off regular upper body clothing?: A Little Help from another person to put on and taking off regular lower body clothing?: A Little 6 Click Score: 18   End of Session Equipment Utilized During Treatment: Gait belt;Rolling walker (2 wheels)  Activity Tolerance: Other (comment) (self limiting.) Patient left: in bed;with call bell/phone within reach;with bed alarm set  OT Visit Diagnosis: Other abnormalities  of gait and mobility (R26.89);Muscle weakness (generalized) (M62.81)                Time: 7711-6579 OT Time Calculation (min): 21 min Charges:  OT General Charges $OT Visit: 1 Visit OT Evaluation $OT Eval Moderate Complexity: 1 Mod OT Treatments $Self Care/Home Management : 8-22 mins  Shara Blazing, M.S., OTR/L Ascom: [phone] 07/27/22, 10:10 AM

## 2022-07-27 NOTE — Progress Notes (Signed)
Met with the patient She is interested in [facility] Aide I explained that an aide can not come out without PT or nursing She is agreeable but stated she only needs an aide, she walked 150 ft with PT She has DME at home including Rolling walker, cane, and shower seat, she doe snot need or want additional DME [facility] has declined accepting the patient [facility] can not accept the patient [facility] can not accept the patient Awaiting to hear back from [facility]

## 2022-07-27 NOTE — Plan of Care (Signed)

## 2022-07-27 NOTE — Progress Notes (Signed)
Initial Nutrition Assessment  DOCUMENTATION CODES:   Not applicable  INTERVENTION:   Ensure Max protein supplement BID, each supplement provides 150kcal and 30g of protein.  MVI po daily   Liberalize diet   NUTRITION DIAGNOSIS:   Increased nutrient needs related to catabolic illness (COPD) as evidenced by estimated needs.  GOAL:   Patient will meet greater than or equal to 90% of their needs  MONITOR:   PO intake, Supplement acceptance, Labs, Weight trends, Skin, I & O's  REASON FOR ASSESSMENT:   Consult Assessment of nutrition requirement/status  ASSESSMENT:   66 y/o female with h/o COPD, CHF, GERD, HTN and DM who is admitted with COPD exacerbation.  Met with pt in room today. Pt reports decreased oral intake for several days pta r/t SOB. Pt reports that her appetite is fair in hospital. Pt is documented to be eating 60-100% of meals. RD discussed with pt the importance of adequate nutrition needed to preserve lean muscle. Pt is willing to try chocolate Ensure in hospital. RD will add supplements and MVI. RD will also liberalize pt's diet. Per chart, pt appears weight stable at baseline.   Medications reviewed and include: plavix, doxycycline, lasix, insulin, protonix, aldactone  Labs reviewed: K 4.3 wnl, creat 1.07(H) Wbc- 14.8(H) Cbgs- 215, 225 x 24 hrs AIC 7.0(H)- 7/19  NUTRITION - FOCUSED PHYSICAL EXAM:  Flowsheet Row Most Recent Value  Orbital Region No depletion  Upper Arm Region No depletion  Thoracic and Lumbar Region No depletion  Buccal Region No depletion  Temple Region No depletion  Clavicle Bone Region No depletion  Clavicle and Acromion Bone Region No depletion  Scapular Bone Region No depletion  Dorsal Hand No depletion  Patellar Region No depletion  Anterior Thigh Region No depletion  Posterior Calf Region No depletion  Edema (RD Assessment) None  Hair Reviewed  Eyes Reviewed  Mouth Reviewed  Skin Reviewed  Nails Reviewed   Diet  Order:   Diet Order             Diet heart healthy/carb modified Room service appropriate? Yes; Fluid consistency: Thin  Diet effective now                  EDUCATION NEEDS:   Education needs have been addressed  Skin:  Skin Assessment: Reviewed RN Assessment  Last BM:  9/11  Height:   Ht Readings from Last 1 Encounters:  07/26/22 5' 9.5" (1.765 m)    Weight:   Wt Readings from Last 1 Encounters:  07/26/22 84.4 kg    Ideal Body Weight:  59 kg  BMI:  Body mass index is 27.07 kg/m.  Estimated Nutritional Needs:   Kcal:  1800-2100kcal/day  Protein:  90-105g/day  Fluid:  1.7-2.0L/day  Koleen Distance MS, RD, LDN Please refer to [facility] for RD and/or RD on-call/weekend/after hours pager

## 2022-07-27 NOTE — TOC Progression Note (Signed)
Transition of Care [facility]) - Progression Note    Patient Details  Name: Alison Taylor MRN: [id number] Date of Birth: 1956-07-20  Transition of Care [facility]) CM/SW [place], RN Phone Number: 07/27/2022, 3:28 PM  Clinical Narrative:    Lajean Manes has accepted the patient for [facility]   Expected Discharge Plan: [place] Barriers to Discharge: Continued Medical Work up  Expected Discharge Plan and Services Expected Discharge Plan: [place]   Discharge Planning Services: CM Consult   Living arrangements for the past 2 months: Single Family Home                 DME Arranged: N/A DME Agency: NA       HH Arranged: PT, Nurse's Aide HH Agency: [place] Date HH Agency Contacted: 07/27/22 Time HH Agency Contacted: 1341 Representative spoke with at [place]: cheryl   Social Determinants of Health (SDOH) Interventions Housing Interventions: Intervention Not Indicated  Readmission Risk Interventions     No data to display

## 2022-07-27 NOTE — Inpatient Diabetes Management (Signed)
Inpatient Diabetes Program Recommendations  AACE/ADA: New Consensus Statement on Inpatient Glycemic Control (2015)  Target Ranges:  Prepandial:   less than 140 mg/dL      Peak postprandial:   less than 180 mg/dL (1-2 hours)      Critically ill patients:  140 - 180 mg/dL   Lab Results  Component Value Date   GLUCAP 258 (H) 07/26/2022   HGBA1C 7.0 (H) 06/02/2022    Review of Glycemic Control  Latest Reference Range & Units 07/26/22 20:53 07/27/22 11:29  Glucose-Capillary 70 - 99 mg/dL 258 (H) 225 (H)  (H): Data is abnormally high Diabetes history: Type 2 DM Outpatient Diabetes medications: Humalog 0-5 units TID, Lantus 35 units QD Current orders for Inpatient glycemic control: Novolog 0-9 units TID Solumedrol 40 mg BID Prednisone 40 mg QA (9/13)  Inpatient Diabetes Program Recommendations:    Consider adding Semglee 18 units QD and Novolog 3 units TID (assuming patient consuming >50% of meals).  Thanks, Bronson Curb, MSN, RNC-OB Diabetes Coordinator [phone] (8a-5p)

## 2022-07-27 NOTE — Progress Notes (Signed)
  Progress Note   Patient: Alison Taylor MAY:[id number] DOB: Jul 23, 1956 DOA: 07/26/2022     1 DOS: the patient was seen and examined on 07/27/2022   Brief hospital course: Alison Taylor is a 66 y.o. female with medical history significant of COPD, heart failure with preserved ejection fraction, GERD, hyperlipidemia, essential hypertension, hypothyroidism, diabetes type 2 and history of pituitary adenoma who presented to the ER with significant shortness of breath cough for the last 2 days.  Patient is on home O2 but uses 2 L at night and as needed during the day. Patient diagnosed with acute on chronic respiratory failure with COPD exacerbation.  Placed on steroids.    Assessment and Plan:  Acute on chronic respiratory failure with hypoxemia. COPD exacerbation. He was started on prednisone and doxycycline since admission, she is also receiving bronchodilators.  She is already feeling better today.  Continue current treatment, anticipating discharge in 1 to 2 days.  Uncontrolled type 2 diabetes with hyperglycemia. Increased glucose due to steroids.  Continue long-acting insulin and sliding scale insulin.  Chronic diastolic congestive heart failure. No evidence of volume overload, resume home diuretic dose.  Discontinue IV fluids.  Reactive thrombocytosis. Continue to follow.      Subjective:  Patient still has significant short of breath with exertion, she became short of breath while talking. Cough, nonproductive.  Physical Exam: Vitals:   07/27/22 0008 07/27/22 0131 07/27/22 0734 07/27/22 0826  BP: (!) 137/54   (!) 114/52  Pulse: 94   95  Resp: 20   17  Temp: 98 F (36.7 C)   98.1 F (36.7 C)  TempSrc: Oral     SpO2: 97% 96% 96% 100%  Weight:      Height:       General exam: Appears calm and comfortable  Respiratory system: Significant decreased breathing sounds. Respiratory effort normal. Cardiovascular system: S1 & S2 heard, RRR. No JVD, murmurs, rubs, gallops or  clicks. No pedal edema. Gastrointestinal system: Abdomen is nondistended, soft and nontender. No organomegaly or masses felt. Normal bowel sounds heard. Central nervous system: Alert and oriented. No focal neurological deficits. Extremities: Symmetric 5 x 5 power. Skin: No rashes, lesions or ulcers Psychiatry: Judgement and insight appear normal. Mood & affect appropriate.   Data Reviewed:  Chest CT scan did not see any lung nodule originally reported on x-ray.  Family Communication: None  Disposition: Status is: Inpatient Remains inpatient appropriate because: Severity of disease, hypoxemia.  Planned Discharge Destination: Home    Time spent: 35 minutes  Author: Sharen Hones, MD 07/27/2022 12:17 PM  For on call review [email or web address].

## 2022-07-27 NOTE — TOC Progression Note (Signed)
Transition of Care [facility]) - Progression Note    Patient Details  Name: [name] MRN: [id number] Date of Birth: [birth year]  Transition of Care [facility]) CM/SW [place], RN Phone Number: 07/27/2022, 12:54 PM  Clinical Narrative:      Transition of Care [facility]) Screening Note   Patient Details  Name: ELEONOR OCON Date of Birth: Apr 27, 1956   Transition of Care [facility]) CM/SW Contact:    Conception Oms, RN Phone Number: 07/27/2022, 12:54 PM    Transition of Care Department [facility]) has reviewed patient and no TOC needs have been identified at this time. We will continue to monitor patient advancement through interdisciplinary progression rounds. If new patient transition needs arise, please place a TOC consult.         Expected Discharge Plan and Services                                                 Social Determinants of Health (SDOH) Interventions Housing Interventions: Intervention Not Indicated  Readmission Risk Interventions     No data to display

## 2022-07-27 NOTE — Plan of Care (Signed)
  Problem: Activity: Goal: Risk for activity intolerance will decrease Outcome: Progressing   Problem: Pain Managment: Goal: General experience of comfort will improve Outcome: Progressing   Problem: Safety: Goal: Ability to remain free from injury will improve Outcome: Progressing   

## 2022-07-27 NOTE — Evaluation (Signed)
Physical Therapy Evaluation Patient Details Name: Alison Taylor MRN: [id number] DOB: [birth year] Today's Date: 07/27/2022  History of Present Illness  Alison Taylor is a 66 y.o. female with medical history significant of COPD, heart failure with preserved ejection fraction, GERD, hyperlipidemia, essential hypertension, hypothyroidism, diabetes type 2 and history of pituitary adenoma who presented to the ER with significant shortness of breath cough for the last 2 days.  Clinical Impression  Pt did well with PT but is not at her baseline.  She reports normally being able to be up ad lib w/o AD and able to do all she needs.  States that the last 2 months have been more limited but still not needing O2 (except at night) or an AD, both of which we needed today for ambulation.  Pt was able to ambulate relatively well but did fatigue to the point of needing a seated rest break at ~150 ft.       Recommendations for follow up therapy are one component of a multi-disciplinary discharge planning process, led by the attending physician.  Recommendations may be updated based on patient status, additional functional criteria and insurance authorization.  Follow Up Recommendations Home health PT      Assistance Recommended at Discharge Intermittent Supervision/Assistance  Patient can return home with the following  Assistance with cooking/housework;Assist for transportation    Equipment Recommendations None recommended by PT  Recommendations for Other Services       Functional Status Assessment Patient has had a recent decline in their functional status and demonstrates the ability to make significant improvements in function in a reasonable and predictable amount of time.     Precautions / Restrictions Precautions Precautions: Fall Restrictions Weight Bearing Restrictions: No      Mobility  Bed Mobility Overal bed mobility: Modified Independent Bed Mobility: Supine to Sit            General bed mobility comments: able to get to EOB w/o assist    Transfers Overall transfer level: Modified independent Equipment used: Rolling walker (2 wheels)               General transfer comment: Pt was able to rise to standing w/o assist, light use of AD to stabilize but safe and confident    Ambulation/Gait Ambulation/Gait assistance: Min guard Gait Distance (Feet): 150 Feet Assistive device: Rolling walker (2 wheels) (2L O2)         General Gait Details: Pt with slow but steady gait.  She does not typically need/use an AD but reports feeling better using it - on 2L t/o the effort with sats staying in the mid 90s (HR ~100+/- the entire time)  Stairs            Wheelchair Mobility    Modified Rankin (Stroke Patients Only)       Balance Overall balance assessment: Needs assistance Sitting-balance support: Feet supported, No upper extremity supported Sitting balance-Leahy Scale: Good     Standing balance support: Bilateral upper extremity supported Standing balance-Leahy Scale: Good Standing balance comment: does not typically need/use AD, no balance issues during ambulation with walker this date                             Pertinent Vitals/Pain Pain Assessment Pain Assessment: No/denies pain    Home Living Family/patient expects to be discharged to:: Private residence Living Arrangements: Other relatives;Non-relatives/Friends Available Help at Discharge: Family Type of Home: House  Home Access: Stairs to enter Entrance Stairs-Rails: Left;Right;Can reach both Entrance Stairs-Number of Steps: 4   Home Layout: Other (Comment) (1 step to get to [employer]) Home Equipment: Kasandra Knudsen - single point;Shower Land (2 wheels);Hand held shower head      Prior Function Prior Level of Function : Needs assist             Mobility Comments: MOD I with PRN cane since recent vascular sx. ADLs Comments: PRN assist for dressing/bathing from  relatives, pt is typicallyMOD I per report; family assists with IADLs     Hand Dominance        Extremity/Trunk Assessment   Upper Extremity Assessment Upper Extremity Assessment: Generalized weakness    Lower Extremity Assessment Lower Extremity Assessment: Generalized weakness       Communication   Communication: No difficulties  Cognition Arousal/Alertness: Awake/alert Behavior During Therapy: WFL for tasks assessed/performed, Anxious Overall Cognitive Status: Within Functional Limits for tasks assessed                                          General Comments General comments (skin integrity, edema, etc.): O2 and HR steady during ambulation (on 2L)    Exercises     Assessment/Plan    PT Assessment Patient needs continued PT services  PT Problem List Decreased strength;Decreased range of motion;Decreased activity tolerance;Decreased safety awareness;Decreased knowledge of use of DME;Cardiopulmonary status limiting activity;Decreased balance       PT Treatment Interventions DME instruction;Gait training;Therapeutic activities;Functional mobility training;Therapeutic exercise;Balance training;Patient/family education    PT Goals (Current goals can be found in the Care Plan section)  Acute Rehab PT Goals Patient Stated Goal: go home PT Goal Formulation: With patient Time For Goal Achievement: 08/09/22 Potential to Achieve Goals: Good    Frequency Min 2X/week     Co-evaluation               AM-PAC PT "6 Clicks" Mobility  Outcome Measure Help needed turning from your back to your side while in a flat bed without using bedrails?: None Help needed moving from lying on your back to sitting on the side of a flat bed without using bedrails?: None Help needed moving to and from a bed to a chair (including a wheelchair)?: None Help needed standing up from a chair using your arms (e.g., wheelchair or bedside chair)?: None Help needed to walk in  hospital room?: A Little Help needed climbing 3-5 steps with a railing? : A Little 6 Click Score: 22    End of Session Equipment Utilized During Treatment: Gait belt;Oxygen (2L) Activity Tolerance: Patient tolerated treatment well;Patient limited by fatigue Patient left: in chair;with call bell/phone within reach;with nursing/sitter in room Nurse Communication: Mobility status PT Visit Diagnosis: Muscle weakness (generalized) (M62.81);Difficulty in walking, not elsewhere classified (R26.2)    Time: 3845-3646 PT Time Calculation (min) (ACUTE ONLY): 28 min   Charges:   PT Evaluation $PT Eval Low Complexity: 1 Low PT Treatments $Gait Training: 8-22 mins        Kreg Shropshire, DPT 07/27/2022, 1:34 PM

## 2022-07-28 DIAGNOSIS — E1165 Type 2 diabetes mellitus with hyperglycemia: Secondary | ICD-10-CM

## 2022-07-28 DIAGNOSIS — I5032 Chronic diastolic (congestive) heart failure: Secondary | ICD-10-CM | POA: Diagnosis not present

## 2022-07-28 DIAGNOSIS — D75838 Other thrombocytosis: Secondary | ICD-10-CM

## 2022-07-28 DIAGNOSIS — N183 Chronic kidney disease, stage 3 unspecified: Secondary | ICD-10-CM

## 2022-07-28 DIAGNOSIS — J441 Chronic obstructive pulmonary disease with (acute) exacerbation: Secondary | ICD-10-CM | POA: Diagnosis not present

## 2022-07-28 DIAGNOSIS — J9621 Acute and chronic respiratory failure with hypoxia: Secondary | ICD-10-CM | POA: Diagnosis not present

## 2022-07-28 LAB — GLUCOSE, CAPILLARY
Glucose-Capillary: 116 mg/dL — ABNORMAL HIGH (ref 70–99)
Glucose-Capillary: 159 mg/dL — ABNORMAL HIGH (ref 70–99)
Glucose-Capillary: 256 mg/dL — ABNORMAL HIGH (ref 70–99)
Glucose-Capillary: 213 mg/dL — ABNORMAL HIGH (ref 70–99)

## 2022-07-28 LAB — HIV ANTIBODY (ROUTINE TESTING W REFLEX): HIV Screen 4th Generation wRfx: NONREACTIVE

## 2022-07-28 MED ORDER — METHYLPREDNISOLONE SODIUM SUCC 40 MG IJ SOLR
40.0000 mg | Freq: Every day | INTRAMUSCULAR | Status: DC
  Administered 2022-07-28 – 2022-07-29 (×2): 40 mg via INTRAVENOUS
  Filled 2022-07-28 (×2): qty 1

## 2022-07-28 NOTE — Assessment & Plan Note (Addendum)
CKD stage IIIa creatinine 1.07 upon discharge with a GFR 58

## 2022-07-28 NOTE — Assessment & Plan Note (Signed)
On levothyroxine  ?

## 2022-07-28 NOTE — Assessment & Plan Note (Signed)
Hemoglobin A1c 7.0.  Continue insulin and sliding scale.

## 2022-07-28 NOTE — Progress Notes (Signed)
Occupational Therapy Treatment Patient Details Name: Alison Taylor MRN: [id number] DOB: 1956/10/03 Today's Date: 07/28/2022   History of present illness Alison Taylor is a 66 y.o. female with medical history significant of COPD, heart failure with preserved ejection fraction, GERD, hyperlipidemia, essential hypertension, hypothyroidism, diabetes type 2 and history of pituitary adenoma who presented to the ER with significant shortness of breath cough for the last 2 days.   OT comments  Upon entering session, pt resting in bed and agreeable to OT. Pt complete bed mobility with Mod I, BSC transfer with supervision, toileting tasks with supervision, and seated grooming with set up A. Pt reported SOB with activity, however, VSS. Pt deferred further OOB mobility 2/2 fatigue. Pt is making progress toward goal completion. D/C recommendation remains appropriate. OT will continue to follow acutely.     Recommendations for follow up therapy are one component of a multi-disciplinary discharge planning process, led by the attending physician.  Recommendations may be updated based on patient status, additional functional criteria and insurance authorization.    Follow Up Recommendations  Home health OT    Assistance Recommended at Discharge Intermittent Supervision/Assistance  Patient can return home with the following  A little help with walking and/or transfers;A little help with bathing/dressing/bathroom;Help with stairs or ramp for entrance;Assist for transportation;Assistance with cooking/housework   Equipment Recommendations  BSC/3in1    Recommendations for Other Services      Precautions / Restrictions Precautions Precautions: Fall Restrictions Weight Bearing Restrictions: No       Mobility Bed Mobility Overal bed mobility: Modified Independent Bed Mobility: Supine to Sit, Sit to Supine     Supine to sit: Modified independent (Device/Increase time), HOB elevated Sit to supine:  Modified independent (Device/Increase time), HOB elevated        Transfers Overall transfer level: Needs assistance Equipment used: None Transfers: Sit to/from Stand Sit to Stand: Supervision           General transfer comment: deferred using AD, no LOB     Balance Overall balance assessment: Needs assistance Sitting-balance support: Feet supported, No upper extremity supported Sitting balance-Leahy Scale: Good     Standing balance support: No upper extremity supported Standing balance-Leahy Scale: Fair                             ADL either performed or assessed with clinical judgement   ADL Overall ADL's : Needs assistance/impaired     Grooming: Wash/dry face;Supervision/safety;Set up;Sitting                   Toilet Transfer: Company secretary Details (indicate cue type and reason): no AD Toileting- Clothing Manipulation and Hygiene: Supervision/safety;Sit to/from stand;Sitting/lateral lean Toileting - Clothing Manipulation Details (indicate cue type and reason): peri care in sitting and clothing management in standing with supervision, no AD       General ADL Comments: Pt deferred further ADL tasks, reporting SOB with exertion    Extremity/Trunk Assessment Upper Extremity Assessment Upper Extremity Assessment: Generalized weakness   Lower Extremity Assessment Lower Extremity Assessment: Generalized weakness        Vision Baseline Vision/History: 1 Wears glasses Ability to See in Adequate Light: 1 Impaired Patient Visual Report: No change from baseline     Perception     Praxis      Cognition   Behavior During Therapy: WFL for tasks assessed/performed, Anxious Overall Cognitive Status: Within Functional Limits for tasks assessed  Exercises      Shoulder Instructions       General Comments VSS on 3L O2    Pertinent Vitals/ Pain        Pain Assessment Pain Assessment: No/denies pain  Home Living                                          Prior Functioning/Environment              Frequency  Min 2X/week        Progress Toward Goals  OT Goals(current goals can now be found in the care plan section)  Progress towards OT goals: Progressing toward goals  Acute Rehab OT Goals Patient Stated Goal: to go home OT Goal Formulation: With patient Time For Goal Achievement: 08/10/22 Potential to Achieve Goals: Good  Plan Discharge plan remains appropriate;Frequency remains appropriate    Co-evaluation                 AM-PAC OT "6 Clicks" Daily Activity     Outcome Measure   Help from another person eating meals?: A Little Help from another person taking care of personal grooming?: A Little Help from another person toileting, which includes using toliet, bedpan, or urinal?: A Little Help from another person bathing (including washing, rinsing, drying)?: A Little Help from another person to put on and taking off regular upper body clothing?: A Little Help from another person to put on and taking off regular lower body clothing?: A Little 6 Click Score: 18    End of Session Equipment Utilized During Treatment: Gait belt;Oxygen  OT Visit Diagnosis: Other abnormalities of gait and mobility (R26.89);Muscle weakness (generalized) (M62.81)   Activity Tolerance Patient limited by fatigue   Patient Left in bed;with call bell/phone within reach;with bed alarm set   Nurse Communication Mobility status        Time: 8366-2947 OT Time Calculation (min): 10 min  Charges: OT General Charges $OT Visit: 1 Visit OT Treatments $Self Care/Home Management : 8-22 mins   [facility] MS, OTR/L ascom [phone]  07/28/22, 2:40 PM

## 2022-07-28 NOTE — Assessment & Plan Note (Signed)
On cabergoline

## 2022-07-28 NOTE — Assessment & Plan Note (Signed)
Platelet count 423

## 2022-07-28 NOTE — Assessment & Plan Note (Addendum)
Pulse ox 70% on room air on presentation.  With the patient moving better air she was able to come off oxygen.  Even with ambulation pulse ox remained at 91%.  The patient does have nocturnal oxygen already at home.

## 2022-07-28 NOTE — Plan of Care (Signed)

## 2022-07-28 NOTE — Progress Notes (Addendum)
Progress Note   Patient: Alison Taylor:[id number] DOB: 02-25-1956 DOA: 07/26/2022     2 DOS: the patient was seen and examined on 07/28/2022   Brief hospital course: Alison Taylor is a 66 y.o. female with medical history significant of COPD, heart failure with preserved ejection fraction, GERD, hyperlipidemia, essential hypertension, hypothyroidism, diabetes type 2 and history of pituitary adenoma who presented to the ER with significant shortness of breath cough for the last 2 days.  Patient is on home O2 but uses 2 L at night and as needed during the day. Patient diagnosed with acute on chronic respiratory failure with COPD exacerbation.  Placed on steroids.    Assessment and Plan: * Chronic obstructive pulmonary disease with acute exacerbation (HCC) Switch back to IV Solu-Medrol.  Continue nebulizers and inhalers.  Acute on chronic respiratory failure (HCC) Pulse ox 70% on room air on presentation.  Currently on 3.5 L of oxygen.  Patient only wears nocturnal oxygen and as needed during the day.  CKD (chronic kidney disease), stage III (HCC) CKD stage IIIa  Reactive thrombocytosis Platelet count 423  Uncontrolled type 2 diabetes mellitus with hyperglycemia, with long-term current use of insulin (HCC) Hemoglobin A1c 7.0.  Continue insulin and sliding scale.  (HFpEF) heart failure with preserved ejection fraction (HCC) Chronic in nature.  No evidence of volume overload currently.  Patient on Lasix and spironolactone.  No beta-blocker with bronchospasm.  Last echocardiogram 12/2021 showed an EF of 60 to 65%.  Acromegaly and pituitary gigantism (HCC) On cabergoline  Hypothyroidism On levothyroxine        Subjective: Patient feels a little bit better than when she came in but not great.  Normally she wears oxygen at night only.  Was on 3.5 L of oxygen this morning.  Still having shortness of breath wheeze and a dry cough.  Worse with walking and she gave out with shortness  of breath and weakness with walking.  Physical Exam: Vitals:   07/28/22 0100 07/28/22 0747 07/28/22 0807 07/28/22 0952  BP:   138/80   Pulse:   82   Resp:   (!) 24 20  Temp:   97.8 F (36.6 C)   TempSrc:   Oral   SpO2: 94% 95% 96%   Weight:      Height:       Physical Exam HENT:     Head: Normocephalic.     Mouth/Throat:     Pharynx: No oropharyngeal exudate.  Eyes:     General: Lids are normal.     Conjunctiva/sclera: Conjunctivae normal.  Cardiovascular:     Rate and Rhythm: Normal rate and regular rhythm.     Heart sounds: Normal heart sounds, S1 normal and S2 normal.  Pulmonary:     Breath sounds: Examination of the right-middle field reveals wheezing. Examination of the left-middle field reveals wheezing. Examination of the right-lower field reveals decreased breath sounds and wheezing. Examination of the left-lower field reveals decreased breath sounds and wheezing. Decreased breath sounds and wheezing present. No rhonchi or rales.  Abdominal:     Palpations: Abdomen is soft.     Tenderness: There is no abdominal tenderness.  Musculoskeletal:     Right lower leg: No swelling.     Left lower leg: No swelling.  Skin:    General: Skin is warm.     Findings: No rash.  Neurological:     Mental Status: She is alert and oriented to person, place, and time.  Data Reviewed: Creatinine 1.07, white blood cell count 14.8, hemoglobin 11.2, platelet count 423  Family Communication: Declined  Disposition: Status is: Inpatient Remains inpatient appropriate because: Still wheezing and requiring IV Solu-Medrol  Planned Discharge Destination: Home with Home Health    Time spent: 28 minutes  Author: Loletha Grayer, MD 07/28/2022 1:57 PM  For on call review [email or web address].

## 2022-07-28 NOTE — Assessment & Plan Note (Addendum)
Patient much improved and not having the bronchospasm that she had yesterday.  Continue Solu-Medrol on the day of discharge and switch over to prednisone taper for tomorrow.  The patient does take chronic 10 mg of prednisone on a daily basis.

## 2022-07-28 NOTE — Assessment & Plan Note (Addendum)
Chronic in nature.  No evidence of volume overload currently.  Patient on Lasix and spironolactone.  No beta-blocker with bronchospasm.  Last echocardiogram 12/2021 showed an EF of 60 to 65%.

## 2022-07-29 DIAGNOSIS — N1831 Chronic kidney disease, stage 3a: Secondary | ICD-10-CM | POA: Diagnosis not present

## 2022-07-29 DIAGNOSIS — J9621 Acute and chronic respiratory failure with hypoxia: Secondary | ICD-10-CM | POA: Diagnosis not present

## 2022-07-29 DIAGNOSIS — E039 Hypothyroidism, unspecified: Secondary | ICD-10-CM

## 2022-07-29 DIAGNOSIS — E220 Acromegaly and pituitary gigantism: Secondary | ICD-10-CM

## 2022-07-29 DIAGNOSIS — I5032 Chronic diastolic (congestive) heart failure: Secondary | ICD-10-CM | POA: Diagnosis not present

## 2022-07-29 DIAGNOSIS — J441 Chronic obstructive pulmonary disease with (acute) exacerbation: Secondary | ICD-10-CM | POA: Diagnosis not present

## 2022-07-29 LAB — GLUCOSE, CAPILLARY: Glucose-Capillary: 97 mg/dL (ref 70–99)

## 2022-07-29 MED ORDER — DOXYCYCLINE HYCLATE 100 MG PO TABS: 100.0000 mg | ORAL_TABLET | Freq: Two times a day (BID) | ORAL | 0 refills | Status: AC

## 2022-07-29 MED ORDER — GABAPENTIN 800 MG PO TABS: 400.0000 mg | ORAL_TABLET | Freq: Two times a day (BID) | ORAL | Status: AC

## 2022-07-29 MED ORDER — PREDNISONE 10 MG PO TABS: ORAL_TABLET | ORAL | 0 refills | Status: AC

## 2022-07-29 MED ORDER — LANTUS SOLOSTAR 100 UNIT/ML ~~LOC~~ SOPN: 25.0000 [IU] | PEN_INJECTOR | Freq: Every day | SUBCUTANEOUS | 11 refills | Status: AC

## 2022-07-29 NOTE — Progress Notes (Signed)
SATURATION QUALIFICATIONS: (This note is used to comply with regulatory documentation for home oxygen)  Patient Saturations on Room Air at Rest = 95 %  Patient Saturations on Room Air while Ambulating = 91 %  Patient Saturations on 3 Liters of oxygen while Ambulating = 100%  Please briefly explain why patient needs home oxygen:

## 2022-07-29 NOTE — Plan of Care (Signed)

## 2022-07-29 NOTE — Progress Notes (Signed)
Pt independent; refuses bed alarm; pt educated using call bell, grip socks and dangling before standing. Pt verbalized understanding. Bed is low and personal items near by.

## 2022-07-29 NOTE — Discharge Summary (Signed)
Physician Discharge Summary   Patient: Alison Taylor MRN: [id number] DOB: 1955/12/10  Admit date:     07/26/2022  Discharge date: 07/29/22  Discharge Physician: Loletha Grayer   PCP: Philmore Pali, NP   Recommendations at discharge:   Follow-up PCP in 5 days  Discharge Diagnoses: Principal Problem:   Chronic obstructive pulmonary disease with acute exacerbation (HCC) Active Problems:   Acute on chronic respiratory failure (HCC)   Acute respiratory failure with hypoxia and hypercapnia (HCC)   Hypothyroidism   Type 2 diabetes mellitus without complication, with long-term current use of insulin (HCC)   Acromegaly and pituitary gigantism (HCC)   GERD (gastroesophageal reflux disease)   (HFpEF) heart failure with preserved ejection fraction ([place])   Uncontrolled type 2 diabetes mellitus with hyperglycemia, with long-term current use of insulin (HCC)   Reactive thrombocytosis   CKD (chronic kidney disease), stage III [facility])    Hospital Course: Alison Taylor is a 66 y.o. female with medical history significant of COPD, heart failure with preserved ejection fraction, GERD, hyperlipidemia, essential hypertension, hypothyroidism, diabetes type 2 and history of pituitary adenoma who presented to the ER with significant shortness of breath cough for the last 2 days.  Patient is on home O2 but uses 2 L at night and as needed during the day. Patient diagnosed with acute on chronic respiratory failure with COPD exacerbation.  Placed on steroids.    Assessment and Plan: * Chronic obstructive pulmonary disease with acute exacerbation ([place]) Patient much improved and not having the bronchospasm that she had yesterday.  Continue Solu-Medrol on the day of discharge and switch over to prednisone taper for tomorrow.  The patient does take chronic 10 mg of prednisone on a daily basis.  Acute on chronic respiratory failure (HCC) Pulse ox 70% on room air on presentation.  With the patient moving better  air she was able to come off oxygen.  Even with ambulation pulse ox remained at 91%.  The patient does have nocturnal oxygen already at home.  CKD (chronic kidney disease), stage III (HCC) CKD stage IIIa creatinine 1.07 upon discharge with a GFR 58  Reactive thrombocytosis Platelet count 423  Uncontrolled type 2 diabetes mellitus with hyperglycemia, with long-term current use of insulin (HCC) Hemoglobin A1c 7.0.  Continue insulin and sliding scale.  (HFpEF) heart failure with preserved ejection fraction (HCC) Chronic in nature.  No evidence of volume overload currently.  Patient on Lasix and spironolactone.  No beta-blocker with bronchospasm.  Last echocardiogram 12/2021 showed an EF of 60 to 65%.  Acromegaly and pituitary gigantism ([place]) On cabergoline  Hypothyroidism On levothyroxine         Consultants: None Procedures performed: None Disposition: Home Diet recommendation:  Cardiac and Carb modified diet DISCHARGE MEDICATION: Allergies as of 07/29/2022       Reactions   Iodinated Contrast Media Other (See Comments), Anaphylaxis, Hives, Rash   Reaction:  Redness  Other reaction(s): Other (See Comments) Reaction:  Redness  IVP DYE CAUSED REDNESS AND SOB.  DOES FINE WITH PREMED. LBW 4/15 Reaction:  Redness    Iodine Hives   Has to have prep to use   Other Anaphylaxis   Iv contrast dye   Cephalexin Hives, Rash   Patient has tolerated cefepime previously (Nov 2020 admission)   Rosuvastatin    Other reaction(s): Other (See Comments), Other (See Comments) Severe leg cramps Severe leg cramps   Moxifloxacin Hcl    Acetaminophen-codeine Palpitations, Hives, Rash   Per pt, she  is able to take tylenol, as well as hydrocodone & Norco. Pt reports only unable to tolerate tylenol #3   Codeine Nausea Only   Ibuprofen Rash   Sulfa Antibiotics Anxiety   Other reaction(s): Other (See Comments), Other (See Comments) anxiety anxiety        Medication List     STOP taking  these medications    aspirin 81 MG chewable tablet   Atrovent HFA 17 MCG/ACT inhaler Generic drug: ipratropium   fentaNYL 12 MCG/HR Commonly known as: DURAGESIC   naloxone 2 MG/2ML injection Commonly known as: NARCAN   ondansetron 4 MG tablet Commonly known as: ZOFRAN   venlafaxine XR 75 MG 24 hr capsule Commonly known as: EFFEXOR-XR       TAKE these medications    acetaminophen 325 MG tablet Commonly known as: TYLENOL Take 650 mg by mouth every 6 (six) hours as needed for mild pain.   albuterol 108 (90 Base) MCG/ACT inhaler Commonly known as: VENTOLIN HFA Inhale 1-2 puffs into the lungs every 6 (six) hours as needed for wheezing or shortness of breath.   atorvastatin 10 MG tablet Commonly known as: LIPITOR Take 1 tablet (10 mg total) by mouth daily.   b complex vitamins tablet Take 1 tablet by mouth daily.   buPROPion 150 MG 12 hr tablet Commonly known as: WELLBUTRIN SR Take 150 mg by mouth 2 (two) times daily.   cabergoline 0.5 MG tablet Commonly known as: DOSTINEX Take 0.5 mg by mouth 3 (three) times a week.   cetirizine 10 MG tablet Commonly known as: ZYRTEC Take 10 mg by mouth daily.   clopidogrel 75 MG tablet Commonly known as: PLAVIX Take 1 tablet (75 mg total) by mouth daily.   cyclobenzaprine 10 MG tablet Commonly known as: FLEXERIL Take 10 mg by mouth 3 (three) times daily as needed for muscle spasms.   docusate sodium 100 MG capsule Commonly known as: COLACE Take 1 capsule (100 mg total) by mouth daily.   doxycycline 100 MG tablet Commonly known as: VIBRA-TABS Take 1 tablet (100 mg total) by mouth every 12 (twelve) hours for 5 doses.   ezetimibe 10 MG tablet Commonly known as: ZETIA Take 10 mg by mouth daily.   fluticasone furoate-vilanterol 100-25 MCG/ACT Aepb Commonly known as: BREO ELLIPTA Inhale into the lungs.   furosemide 40 MG tablet Commonly known as: LASIX Take 40 mg by mouth daily.   gabapentin 800 MG tablet Commonly  known as: NEURONTIN Take 0.5 tablets (400 mg total) by mouth 2 (two) times daily. What changed:  how much to take when to take this   HumaLOG KwikPen 100 UNIT/ML KwikPen Generic drug: insulin lispro Inject 0-5 Units into the skin 3 (three) times daily.   ipratropium-albuterol 0.5-2.5 (3) MG/3ML Soln Commonly known as: DUONEB Inhale 3 mLs into the lungs every 4 (four) hours as needed (wheezing/shortness of breath).   Lantus SoloStar 100 UNIT/ML Solostar Pen Generic drug: insulin glargine Inject 25 Units into the skin daily at 12 noon. What changed: how much to take   levothyroxine 125 MCG tablet Commonly known as: SYNTHROID Take 125 mcg by mouth daily before breakfast.   OCUVITE EYE HEATLH GUMMIES PO Take 2 each by mouth daily.   omeprazole 20 MG capsule Commonly known as: PRILOSEC Take 20 mg by mouth daily as needed (for acid reflux).   ONE TOUCH ULTRA 2 w/Device Kit SMARTSIG:1 Kit(s) Via Meter Every Morning   OneTouch Ultra test strip Generic drug: glucose blood USE TO  CHECK BLOOD SUGAR 3 TIMES A DAY   oxyCODONE-acetaminophen 5-325 MG tablet Commonly known as: PERCOCET/ROXICET Take 2 tablets by mouth every 6 (six) hours as needed for severe pain or moderate pain. What changed: how much to take   OXYGEN 2 L.   predniSONE 10 MG tablet Commonly known as: DELTASONE 4 tabs po daily for two days. 3 tabs po daily for 2 days; 2 tabs po daily for two days then one tab po daily What changed:  how much to take how to take this when to take this additional instructions   Premarin vaginal cream Generic drug: conjugated estrogens Place 0.5 g vaginally 2 (two) times a week.   spironolactone 25 MG tablet Commonly known as: ALDACTONE Take 25 mg by mouth daily.   UltiCare Mini Pen Needles 31G X 6 MM Misc Generic drug: Insulin Pen Needle USE 5 TIMES A DAY AS DIRECTED   BD Pen Needle Micro U/F 32G X 6 MM Misc Generic drug: Insulin Pen Needle SMARTSIG:1 SUB-Q 5 Times  Daily   umeclidinium bromide 62.5 MCG/ACT Aepb Commonly known as: INCRUSE ELLIPTA Inhale into the lungs.   varenicline 0.5 MG X 11 & 1 MG X 42 tablet Commonly known as: CHANTIX PAK SMARTSIG:Box(s) By Mouth As Directed   Vitamin D (Ergocalciferol) 1.25 MG (50000 UNIT) Caps capsule Commonly known as: DRISDOL Take 50,000 Units by mouth 2 (two) times a week.        Follow-up Information     Philmore Pali, NP Follow up in 5 day(s).   Specialty: Nurse Practitioner Why: Appointment on 08/04/22 _0 :[place] Hidden information: [place] [id number] [phone]                Discharge Exam: Danley Danker Weights   07/26/22 2111  Weight: 84.4 kg   Physical Exam HENT:     Head: Normocephalic.     Mouth/Throat:     Pharynx: No oropharyngeal exudate.  Eyes:     General: Lids are normal.     Conjunctiva/sclera: Conjunctivae normal.  Cardiovascular:     Rate and Rhythm: Normal rate and regular rhythm.     Heart sounds: Normal heart sounds, S1 normal and S2 normal.  Pulmonary:     Breath sounds: Examination of the right-lower field reveals decreased breath sounds. Examination of the left-lower field reveals decreased breath sounds. Decreased breath sounds present. No wheezing, rhonchi or rales.  Abdominal:     Palpations: Abdomen is soft.     Tenderness: There is no abdominal tenderness.  Musculoskeletal:     Right lower leg: No swelling.     Left lower leg: No swelling.  Skin:    General: Skin is warm.     Findings: No rash.  Neurological:     Mental Status: She is alert and oriented to person, place, and time.      Condition at discharge: stable  The results of significant diagnostics from this hospitalization (including imaging, microbiology, ancillary and laboratory) are listed below for reference.   Imaging Studies: CT Chest Wo Contrast  Addendum Date: 07/26/2022   ADDENDUM REPORT: 07/26/2022 18:02 ADDENDUM: Voice recognition error within the  impression. The 1st impression should read: No pulmonary nodule, in particular in the left upper lobe as questioned on earlier chest x-ray. Electronically Signed   By: Rolm Baptise M.D.   On: 07/26/2022 18:02   Result Date: 07/26/2022 CLINICAL DATA:  Abnormal xray - lung nodule, >= 1 cm EXAM: CT CHEST WITHOUT CONTRAST  TECHNIQUE: Multidetector CT imaging of the chest was performed following the standard protocol without IV contrast. RADIATION DOSE REDUCTION: This exam was performed according to the departmental dose-optimization program which includes automated exposure control, adjustment of the mA and/or kV according to patient size and/or use of iterative reconstruction technique. COMPARISON:  Chest x-ray today.  CT 06/22/2021 FINDINGS: Cardiovascular: Heart is normal size. Scattered coronary artery and aortic calcifications. No aneurysm. Mediastinum/Nodes: No mediastinal, hilar, or axillary adenopathy. Trachea and esophagus are unremarkable. Thyroid unremarkable. Lungs/Pleura: Mild emphysema. No confluent airspace opacities, effusions or nodules. No nodule in the left upper lobe as questioned on earlier chest x-ray. Upper Abdomen: Mild fullness of the visualized left collecting system, partially imaged on this chest CT, stable since prior CT. No acute abnormality in the upper abdomen. Musculoskeletal: Chest wall soft tissues are unremarkable. No acute bony abnormality. IMPRESSION: Marrow pulmonary nodule, in particular in the left upper lobe as questioned on earlier chest x-ray. No acute cardiopulmonary disease. Coronary artery disease. Aortic Atherosclerosis (ICD10-I70.0) and Emphysema (ICD10-J43.9). Electronically Signed: By: Rolm Baptise M.D. On: 07/26/2022 17:16   DG Chest Portable 1 View  Result Date: 07/26/2022 CLINICAL DATA:  Shortness of breath EXAM: PORTABLE CHEST 1 VIEW COMPARISON:  CTA chest 06/22/2021 and chest radiograph 06/21/2021 FINDINGS: The cardiomediastinal silhouette is normal. There is  no focal consolidation or pulmonary edema. There is no pleural effusion or pneumothorax. There is a possible 2.1 cm nodule in the left upper lobe. There is no acute osseous abnormality. IMPRESSION: 1. No radiographic evidence of acute cardiopulmonary process. 2. Possible 2.1 cm nodule in the left upper lobe. Recommend nonemergent chest CT for further evaluation. Electronically Signed   By: Valetta Mole M.D.   On: 07/26/2022 16:10    Microbiology: Results for orders placed or performed during the hospital encounter of 07/26/22  Resp Panel by RT-PCR (Flu A&B, Covid) Anterior Nasal Swab     Status: None   Collection Time: 07/26/22  6:26 PM   Specimen: Anterior Nasal Swab  Result Value Ref Range Status   SARS Coronavirus 2 by RT PCR NEGATIVE NEGATIVE Final    Comment: (NOTE) SARS-CoV-2 target nucleic acids are NOT DETECTED.  The SARS-CoV-2 RNA is generally detectable in upper respiratory specimens during the acute phase of infection. The lowest concentration of SARS-CoV-2 viral copies this assay can detect is 138 copies/mL. A negative result does not preclude SARS-Cov-2 infection and should not be used as the sole basis for treatment or other patient management decisions. A negative result may occur with  improper specimen collection/handling, submission of specimen other than nasopharyngeal swab, presence of viral mutation(s) within the areas targeted by this assay, and inadequate number of viral copies(<138 copies/mL). A negative result must be combined with clinical observations, patient history, and epidemiological information. The expected result is Negative.  Fact Sheet for Patients:  EntrepreneurPulse.com.au  Fact Sheet for Healthcare Providers:  IncredibleEmployment.be  This test is no t yet approved or cleared by the [place] FDA and  has been authorized for detection and/or diagnosis of SARS-CoV-2 by FDA under an Emergency Use  Authorization (EUA). This EUA will remain  in effect (meaning this test can be used) for the duration of the COVID-19 declaration under Section 564(b)(1) of the Act, 21 U.S.C.section 360bbb-3(b)(1), unless the authorization is terminated  or revoked sooner.       Influenza A by PCR NEGATIVE NEGATIVE Final   Influenza B by PCR NEGATIVE NEGATIVE Final    Comment: (NOTE) The Xpert  Xpress SARS-CoV-2/FLU/RSV plus assay is intended as an aid in the diagnosis of influenza from Nasopharyngeal swab specimens and should not be used as a sole basis for treatment. Nasal washings and aspirates are unacceptable for Xpert Xpress SARS-CoV-2/FLU/RSV testing.  Fact Sheet for Patients: EntrepreneurPulse.com.au  Fact Sheet for Healthcare Providers: IncredibleEmployment.be  This test is not yet approved or cleared by the [place] FDA and has been authorized for detection and/or diagnosis of SARS-CoV-2 by FDA under an Emergency Use Authorization (EUA). This EUA will remain in effect (meaning this test can be used) for the duration of the COVID-19 declaration under Section 564(b)(1) of the Act, 21 U.S.C. section 360bbb-3(b)(1), unless the authorization is terminated or revoked.  Performed at [facility], [address] [place], [place] [address]     Labs: CBC: Recent Labs  Lab 07/26/22 1540 07/27/22 0507  WBC 14.7* 14.8*  NEUTROABS 12.7*  --   HGB 11.9* 11.2*  HCT 36.5 34.0*  MCV 92.2 91.2  PLT 442* 867*   Basic Metabolic Panel: Recent Labs  Lab 07/26/22 1540 07/27/22 0507  NA 138 139  K 4.7 4.3  CL 103 106  CO2 24 23  GLUCOSE 248* 297*  BUN 14 19  CREATININE 1.11* 1.07*  CALCIUM 9.3 9.3   Liver Function Tests: Recent Labs  Lab 07/26/22 1540 07/27/22 0507  AST 25 16  ALT 19 17  ALKPHOS 120 104  BILITOT 0.6 0.2*  PROT 6.9 6.3*  ALBUMIN 3.7 3.4*   CBG: Recent Labs  Lab 07/28/22 0749 07/28/22 1242 07/28/22 1632  07/28/22 2112 07/29/22 0738  GLUCAP 116* 159* 256* 213* 97    Discharge time spent: greater than 30 minutes.  Signed: Loletha Grayer, MD [facility] 07/29/2022

## 2022-07-29 NOTE — Plan of Care (Signed)
  Problem: Education: Goal: Knowledge of General Education information will improve Description: Including pain rating scale, medication(s)/side effects and non-pharmacologic comfort measures Outcome: Progressing   Problem: Health Behavior/Discharge Planning: Goal: Ability to manage health-related needs will improve Outcome: Progressing   Problem: Clinical Measurements: Goal: Ability to maintain clinical measurements within normal limits will improve Outcome: Progressing   Problem: Clinical Measurements: Goal: Diagnostic test results will improve Outcome: Progressing   Problem: Clinical Measurements: Goal: Respiratory complications will improve Outcome: Progressing   Problem: Safety: Goal: Ability to remain free from injury will improve Outcome: Progressing   Problem: Education: Goal: Ability to describe self-care measures that may prevent or decrease complications (Diabetes Survival Skills Education) will improve Outcome: Progressing   Problem: Fluid Volume: Goal: Ability to maintain a balanced intake and output will improve Outcome: Progressing   Problem: Skin Integrity: Goal: Risk for impaired skin integrity will decrease Outcome: Progressing

## 2022-07-29 NOTE — Care Management Important Message (Signed)
Important Message  Patient Details  Name: Alison Taylor MRN: [id number] Date of Birth: 03-Sep-1956   Medicare Important Message Given:  Yes     Loann Quill 07/29/2022, 11:44 AM

## 2022-08-05 ENCOUNTER — Ambulatory Visit (INDEPENDENT_AMBULATORY_CARE_PROVIDER_SITE_OTHER): Payer: Medicare Other | Admitting: Nurse Practitioner

## 2022-08-16 ENCOUNTER — Ambulatory Visit (INDEPENDENT_AMBULATORY_CARE_PROVIDER_SITE_OTHER): Payer: Medicare Other | Admitting: Nurse Practitioner

## 2022-08-31 ENCOUNTER — Encounter (INDEPENDENT_AMBULATORY_CARE_PROVIDER_SITE_OTHER): Payer: Self-pay | Admitting: Nurse Practitioner

## 2022-08-31 ENCOUNTER — Ambulatory Visit (INDEPENDENT_AMBULATORY_CARE_PROVIDER_SITE_OTHER): Payer: Medicare Other | Admitting: Nurse Practitioner

## 2022-08-31 VITALS — BP 132/65 | HR 105 | Resp 20 | Ht 69.0 in | Wt 185.2 lb

## 2022-08-31 DIAGNOSIS — Z794 Long term (current) use of insulin: Secondary | ICD-10-CM

## 2022-08-31 DIAGNOSIS — E119 Type 2 diabetes mellitus without complications: Secondary | ICD-10-CM

## 2022-08-31 DIAGNOSIS — I70249 Atherosclerosis of native arteries of left leg with ulceration of unspecified site: Secondary | ICD-10-CM

## 2022-09-03 NOTE — Progress Notes (Signed)
 Follow-up and Shortness of Breath   History of Present Illness: Alison Taylor is a 66 y.o. female  presents to clinic for follow up mor dyspnea since her inhalers were changed  Problem List:  Patient Active Problem List  Diagnosis  . Polyarthralgia  . Numbness and tingling in both hands  . Acromegalia (CMS-HCC)  . Thyroid  disease  . Newly diagnosed diabetes (CMS-HCC)  . Acute cystitis  . Acute kidney injury (CMS-HCC)  . Acute respiratory failure with hypoxia and hypercapnia (CMS-HCC)  . Allergic rhinitis  . Chronic respiratory failure with hypoxia (CMS-HCC)  . Complicated UTI (urinary tract infection)  . Contusion of right foot  . Chronic obstructive pulmonary disease with acute exacerbation (CMS-HCC)  . Deficiency anemia  . GERD (gastroesophageal reflux disease)  . Glucosuria with normal serum glucose  . History of infection due to drug-resistant organism  . Hypercholesterolemia  . Nausea  . Palpitations  . Pituitary adenoma (CMS-HCC)  . Pyelonephritis  . Pyogenic arthritis of right hip (CMS-HCC)  . Right foot pain  . Severe right groin pain  . Spinal stenosis  . Tobacco abuse  . Type 2 diabetes mellitus without complication, with long-term current use of insulin  (CMS-HCC)  . (HFpEF) heart failure with preserved ejection fraction (CMS-HCC)    Current Medications:  Current Outpatient Medications  Medication Sig Dispense Refill  . aspirin  81 MG chewable tablet Take 1 tablet by mouth once daily    . buPROPion  (WELLBUTRIN  SR) 150 MG SR tablet Take 150 mg by mouth 2 (two) times daily    . cabergoline  (DOSTINEX ) 0.5 mg tablet Take 0.5 mg by mouth twice a week.    . cetirizine (ZYRTEC) 10 MG tablet Take 10 mg by mouth once daily.    . cholecalciferol (VITAMIN D3) 1000 unit tablet Take 1,500 Units by mouth every other day.      . clopidogreL  (PLAVIX ) 75 mg tablet Take by mouth    . conjugated estrogens  (PREMARIN ) 0.625 mg/gram vaginal cream Place vaginally    .  cyclobenzaprine  (FLEXERIL ) 10 MG tablet Take by mouth.    . ezetimibe  (ZETIA ) 10 mg tablet Take 1 tablet by mouth once daily    . fentaNYL  (DURAGESIC ) 12 mcg/hr patch USE 1 PATCH EVERY 72 HOURS FOR BACK PAIN    . fluticasone  furoate-vilanteroL (BREO ELLIPTA ) 100-25 mcg/dose DsDv inhaler Inhale 1 inhalation into the lungs once daily    . FUROsemide  (LASIX ) 40 MG tablet Take 1 tablet by mouth once daily as needed    . gabapentin  (NEURONTIN ) 800 MG tablet TAKE 1 TABLET BY MOUTH THREE TIMES DAILY  5  . HUMALOG KWIKPEN INSULIN  pen injector (concentration 100 units/mL) INJECT 5 UNITS SUBCUTANEOUSLY IF MEALTIME GLUCOSE IS OVER 300    . insulin  GLARGINE (LANTUS ) injection (concentration 100 units/mL) Inject subcutaneously    . ipratropium-albuteroL  (DUO-NEB) nebulizer solution Inhale into the lungs    . levothyroxine  (SYNTHROID ) 137 MCG tablet Take by mouth    . levothyroxine  (SYNTHROID , LEVOTHROID) 125 MCG tablet Take by mouth once daily  0  . meloxicam (MOBIC) 7.5 MG tablet TAKE 1 TABLET BY MOUTH AS NEEDED FOR JOINT PAIN 30 tablet 1  . montelukast (SINGULAIR) 10 mg tablet TAKE 1 TABLET BY MOUTH EVERY DAY AT NIGHT 90 tablet 1  . omeprazole (PRILOSEC) 20 MG DR capsule TAKE 1 TO 2 CAPSULE DAILY 30 MIN BEFORE MEAL    . omeprazole (PRILOSEC) 40 MG DR capsule Take 40 mg by mouth once daily    .  oxyCODONE -acetaminophen  (PERCOCET) 5-325 mg tablet Take 1 tablet by mouth every 6 (six) hours    . OXYGEN -AIR DELIVERY SYSTEMS MISC 2 L    . pravastatin (PRAVACHOL) 40 MG tablet Take 40 mg by mouth once daily    . predniSONE  (DELTASONE ) 10 MG tablet TAKE 1 TABLET BY MOUTH EVERY DAY 30 tablet 1  . PROAIR  HFA 90 mcg/actuation inhaler INHALE 1-2 PUFFS PUFFS EVERY 4 HOURS AS NEEDED FOR WHEEZING  5  . spironolactone  (ALDACTONE ) 25 MG tablet Take 25 mg by mouth once daily.    SABRA umeclidinium (INCRUSE ELLIPTA ) 62.5 mcg/actuation DsDv inhalation unit Inhale into the lungs    . varenicline (CHANTIX) 1 mg tablet     .  venlafaxine  (EFFEXOR ) 75 MG tablet Take 75 mg by mouth.    . budesonide-glycopyrrolate-formoterol  (BREZTRI AEROSPHERE) 160-9-4.8 mcg/actuation inhaler Inhale 2 inhalations into the lungs 2 (two) times daily (Patient not taking: Reported on 09/03/2022) 1 g 12  . HYDROcodone -acetaminophen  (NORCO) 10-325 mg tablet Take 1 tablet by mouth 4 (four) times daily (Patient not taking: Reported on 09/03/2022)    . HYDROcodone -acetaminophen  (NORCO) 5-325 mg tablet Take 1 tablet by mouth 4 (four) times daily (Patient not taking: Reported on 09/03/2022)  0   No current facility-administered medications for this visit.    History: Past Medical History:  Diagnosis Date  . (HFpEF) heart failure with preserved ejection fraction (CMS-HCC) 02/08/2022  . Acromegalia (CMS-HCC)   . Anxiety   . Arthritis   . COPD (chronic obstructive pulmonary disease) (CMS-HCC)   . Depression   . GERD (gastroesophageal reflux disease)   . Hypothyroidism   . Mixed hyperlipidemia   . Tobacco use   . Type 2 diabetes mellitus (CMS-HCC) 11/2016    Past Surgical History:  Procedure Laterality Date  . ovarian cyst removed  1983  . HYSTERECTOMY  1989  . CHOLECYSTECTOMY OPEN  2002  . excision superficial neck mass  2002  . LIVER BIOPSY  2013  . UNLISTED PROCEDURE VASCULAR SURGERY  05/2022   Iliac stent implants    Family History  Problem Relation Age of Onset  . Diabetes Maternal Grandmother   . Stroke Maternal Grandmother   . Diabetes Paternal Grandmother    Shx:  reports that she quit smoking about 6 weeks ago. Her smoking use included cigarettes. She has a 45.00 pack-year smoking history. She has never used smokeless tobacco. She reports that she does not drink alcohol and does not use drugs.   Review of Systems: As per above. All systems were reviewed with her in totality and there were no further positive findings. Presently no bleeding or bruising. No dysuria, hematuria, fever, chills, nor sweats. No lymph nodes,  no rashes, TIAs, seizures, passing out spells, or suicidal ideation. No unstable angina.   Physical Exam: BP 132/72   Pulse 100   Ht 175.3 cm (5' 9)   Wt 84.6 kg (186 lb 9.6 oz)   SpO2 95%   BMI 27.56 kg/m  84.6 kg (186 lb 9.6 oz) 95% General:  NAD. Able to speak in complete sentences without cough or dyspnea HEENT: Normocephalic, nontraumatic. Extraocular movements intact NECK: Supple. No JVD, nodes, thryomegaly CV: RRR no murmurs, gallops, rubs PULM: Normal respiratory effort, Clear to auscultation bilaterally without wheezing or crackles EXTREMITIES: No significant edema, cyanosis or Homans'signs SKIN: Fair turgor. No rashes LYMPHATIC: No nodes NEURO: No gross deficits PSYCH: Appropriate affect   Impression: INTERPRETATION NORMAL LEFT VENTRICULAR SYSTOLIC FUNCTION   WITH MILD LVH NORMAL RIGHT  VENTRICULAR SYSTOLIC FUNCTION TRIVIAL REGURGITATION NOTED (See above) NO VALVULAR STENOSIS Closest EF: >55% (Estimated) Mitral: TRIVIAL MR Tricuspid: TRIVIAL TR _________________________________________________________________________________________ Electronically signed by            MD [place] Jude on 12/22/2020 08: 53 AM          Performed By: Gladis Cough, RDCS, RVT    Ordering Physician: Jude, MD [place]     Impression: Severe copd, off cigarettes, chronic dyspnea  mild wheezing, more short of breath since last seen. She thought the beztri worked better for her. It was change when she was in the hospital ( copd exacerbation). S/p covid 2/22.  -Stop smoking advised -weight loss -switch breo and incruse to breztri 2 puffs bid and albuterol  2 puff qid -Prednisone  10 mg q day -f/u in 16 weeks    Acromegaly, on cabergoline  , on nocturnal oxygen  -following endo recs

## 2022-09-06 ENCOUNTER — Encounter (INDEPENDENT_AMBULATORY_CARE_PROVIDER_SITE_OTHER): Payer: Self-pay | Admitting: Nurse Practitioner

## 2022-09-06 NOTE — Progress Notes (Signed)
Subjective:    Patient ID: Alison Taylor, female    DOB: 04/09/1956, 66 y.o.   MRN: [id number] No chief complaint on file.   Alison Taylor is a 66 y.o. female.  The patient returns several weeks following left femoral endarterectomy with bilateral iliac artery stent placement.  She continues to do well.  Pain in the surgical site has largely resolved.  She still has some numbness within the inside of her leg.  Thigh incision is healing well.  The patient notes that she is walking much better prior to that she did before revascularization.  Rest pain is resolved at this time.      Review of Systems  All other systems reviewed and are negative.      Objective:   Physical Exam Vitals reviewed.  HENT:     Head: Normocephalic.  Cardiovascular:     Rate and Rhythm: Normal rate.     Pulses:          Dorsalis pedis pulses are 1+ on the right side and 1+ on the left side.  Pulmonary:     Effort: Pulmonary effort is normal.  Skin:    General: Skin is warm and dry.  Neurological:     Mental Status: She is alert and oriented to person, place, and time.  Psychiatric:        Mood and Affect: Mood normal.        Behavior: Behavior normal.        Thought Content: Thought content normal.        Judgment: Judgment normal.     BP 132/65 (BP Location: Right Arm)   Pulse (!) 105   Resp 20   Ht _0  (1.753 m)   Wt 185 lb 3.2 oz (84 kg)   LMP 12/10/1987   BMI 27.35 kg/m   Past Medical History:  Diagnosis Date   (HFpEF) heart failure with preserved ejection fraction ([place])    a.) TTE 03/17/2020: EF >55%, no RWMAs, norm RVSF; b.) TTE 12/19/2020: EF >55%, mild LVH, mild RAE, mild MAC, triv MR/TR/PR, G1DD; c.) TTE 12/16/2021: EF >55%, mod LVD, G1DD.   Acromegalia ([place])    a.) on cabergoline   Anemia    Anxiety    Aortic atherosclerosis (HCC)    Arthritis    Chronic prescription opiate use    a.) TD fentanyl + oxy/APAP; b.) has naloxone Rx   Chronic respiratory failure with  hypoxia, on home oxygen therapy ([place])    a.) chronic supplemental oxygen at 2L/[place]   COPD (chronic obstructive pulmonary disease) (HCC)    GERD (gastroesophageal reflux disease)    HLD (hyperlipidemia)    Hypertension    Hypothyroidism    Lumbosacral spinal stenosis    Myalgia due to statin    PAD (peripheral artery disease) ([place]) 05/06/2022   a.) PVC 05/06/2022 --> 60-70% RCIA, occlusion LCIA, 50% RFA   Palpitations    Paresthesias (hands)    Pituitary adenoma (HCC)    Tobacco abuse    Type 2 diabetes mellitus treated with insulin (HCC)    Type 2 MI (myocardial infarction) ([place]) 10/31/2020   a.) troponins 102 --> 304 --> 458; etiology felt to be secondary to AECOPD   Vitamin D deficiency     Social History   Socioeconomic History   Marital status: Divorced    Spouse name: Not on file   Number of children: Not on file   Years of education: Not on file  Highest education level: Not on file  Occupational History   Not on file  Tobacco Use   Smoking status: Every Day    Packs/day: 0.50    Years: 45.00    Total pack years: 22.50    Types: Cigarettes   Smokeless tobacco: Never  Vaping Use   Vaping Use: Never used  Substance and Sexual Activity   Alcohol use: No    Alcohol/week: 0.0 standard drinks of alcohol   Drug use: No   Sexual activity: Never  Other Topics Concern   Not on file  Social History Narrative   Not on file   Social Determinants of Health   Financial Resource Strain: Low Risk  (07/16/2021)   Overall Financial Resource Strain (CARDIA)    Difficulty of Paying Living Expenses: Not hard at all  Food Insecurity: Food Insecurity Present (07/26/2022)   Hunger Vital Sign    Worried About Running Out of Food in the Last Year: Sometimes true    Ran Out of Food in the Last Year: Never true  Transportation Needs: No Transportation Needs (07/26/2022)   PRAPARE - Hydrologist (Medical): No    Lack of Transportation (Non-Medical): No   Physical Activity: Not on file  Stress: No Stress Concern Present (07/01/2021)   [place]    Feeling of Stress : Only a little  Social Connections: Moderately Integrated (07/30/2021)   Social Connection and Isolation Panel [NHANES]    Frequency of Communication with Friends and Family: More than three times a week    Frequency of Social Gatherings with Friends and Family: More than three times a week    Attends Religious Services: More than 4 times per year    Active Member of Clubs or Organizations: Yes    Attends Archivist Meetings: More than 4 times per year    Marital Status: Divorced  Intimate Partner Violence: Unknown (07/26/2022)   Humiliation, Afraid, Rape, and Kick questionnaire    Fear of Current or Ex-Partner: No    Emotionally Abused: No    Physically Abused: Not on file    Sexually Abused: Not on file    Past Surgical History:  Procedure Laterality Date   [place] Left 06/02/2022   Procedure: ENDARTERECTOMY FEMORAL;  Surgeon: Algernon Huxley, MD;  Location: [facility] ORS;  Service: Vascular;  Laterality: Left;   INSERTION OF ILIAC STENT Bilateral 06/02/2022   Procedure: INSERTION OF ILIAC STENT;  Surgeon: Algernon Huxley, MD;  Location: [facility] OR[facility];  Service: Vascular;  Laterality: Bilateral;   Taylor EXTREMITY ANGIOGRAPHY Left 05/06/2022   Procedure: Taylor Extremity Angiography;  Surgeon: Algernon Huxley, MD;  Location: [facility];  Service: Cardiovascular;  Laterality: Left;   NECK SURGERY  2002   OVARIAN CYST SURGERY Left 1983   TIBIA FRACTURE SURGERY Left 1980    Family History  Problem Relation Age of Onset   ALS Brother    Breast cancer Neg Hx     Allergies  Allergen Reactions   Iodinated Contrast Media Other (See Comments), Anaphylaxis, Hives and Rash    Reaction:  Redness  Other reaction(s): Other (See  Comments) Reaction:  Redness  IVP DYE CAUSED REDNESS AND SOB.  DOES FINE WITH PREMED. LBW 4/15 Reaction:  Redness     Iodine Hives    Has to have prep to use   Other  Anaphylaxis    Iv contrast dye   Cephalexin Hives and Rash    Patient has tolerated cefepime previously (Nov 2020 admission)    Rosuvastatin     Other reaction(s): Other (See Comments), Other (See Comments) Severe leg cramps Severe leg cramps    Moxifloxacin Hcl    Acetaminophen-Codeine Palpitations, Hives and Rash    Per pt, she is able to take tylenol, as well as hydrocodone & Norco. Pt reports only unable to tolerate tylenol #3    Codeine Nausea Only   Ibuprofen Rash   Sulfa Antibiotics Anxiety    Other reaction(s): Other (See Comments), Other (See Comments) anxiety anxiety        Latest Ref Rng & Units 07/27/2022    5:07 AM 07/26/2022    3:40 PM 06/02/2022   11:39 PM  CBC  WBC 4.0 - 10.5 K/uL 14.8  14.7  18.3   Hemoglobin 12.0 - 15.0 g/dL 11.2  11.9  11.5   Hematocrit 36.0 - 46.0 % 34.0  36.5  35.0   Platelets 150 - 400 K/uL 423  442  273       CMP     Component Value Date/Time   NA 139 07/27/2022 0507   K 4.3 07/27/2022 0507   CL 106 07/27/2022 0507   CO2 23 07/27/2022 0507   GLUCOSE 297 (H) 07/27/2022 0507   BUN 19 07/27/2022 0507   CREATININE 1.07 (H) 07/27/2022 0507   CALCIUM 9.3 07/27/2022 0507   PROT 6.3 (L) 07/27/2022 0507   ALBUMIN 3.4 (L) 07/27/2022 0507   AST 16 07/27/2022 0507   ALT 17 07/27/2022 0507   ALKPHOS 104 07/27/2022 0507   BILITOT 0.2 (L) 07/27/2022 0507   GFRNONAA 58 (L) 07/27/2022 0507   GFRAA >60 04/12/2016 1213     VAS [place] ABI WITH/WO TBI  Result Date: 08/03/2022  Taylor EXTREMITY DOPPLER STUDY Patient Name:  CHRYSTINE FROGGE  Date of Exam:   07/26/2022 Medical Rec #: [id number]        Accession #:    [id number] Date of Birth: 30-Aug-1956        Patient Gender: F Patient Age:   20 years Exam Location:  [facility] Procedure:      VAS [place] ABI WITH/WO TBI  Referring Phys: --------------------------------------------------------------------------------   Vascular Interventions: 06/02/2022                         1. Left common femoral, profunda femoris, and                         superficial femoral artery endarterectomies and patch                         angioplasty                         2. Catheter placement into right external iliac artery                         from left femoral approach and into the aorta from right                         femoral approach  3. Aortogram and selective left Taylor extremity                         angiogram                         4. Kissing balloon expandable stent placements to                         bilateral common iliac arteries in the distal aorta with                         8 mm diameter by 58 mm length lifestream stents                         bilaterally                         5. Angioplasty of the right external iliac artery with 6                         mm diameter by 6 cm length Lutonix drug-coated                         angioplasty balloon                         6. Additional stent placement to the left external iliac                         artery with 8 mm diameter by 58 mm length lifestream                         stent and 8 mm diameter by 75 mm length Viabahn stent. Performing Technologist: Concha Norway RVT  Examination Guidelines: A complete evaluation includes at minimum, Doppler waveform signals and systolic blood pressure reading at the level of bilateral brachial, anterior tibial, and posterior tibial arteries, when vessel segments are accessible. Bilateral testing is considered an integral part of a complete examination. Photoelectric Plethysmograph (PPG) waveforms and toe systolic pressure readings are included as required and additional duplex testing as needed. Limited examinations for reoccurring indications may be performed as noted.  ABI Findings:  +---------+------------------+-----+---------+--------+ Right    Rt Pressure (mmHg)IndexWaveform Comment  +---------+------------------+-----+---------+--------+ Brachial 146                                      +---------+------------------+-----+---------+--------+ ATA      146               1.00 triphasic         +---------+------------------+-----+---------+--------+ PTA      146               1.00 triphasic         +---------+------------------+-----+---------+--------+ Great Toe130               0.89 Normal            +---------+------------------+-----+---------+--------+ +---------+------------------+-----+---------+-------+ Left     Lt Pressure (mmHg)IndexWaveform Comment +---------+------------------+-----+---------+-------+ Brachial 145                                     +---------+------------------+-----+---------+-------+  ATA      148               1.01 triphasic        +---------+------------------+-----+---------+-------+ PTA      149               1.02 triphasic        +---------+------------------+-----+---------+-------+ Great Toe151               1.03 Normal           +---------+------------------+-----+---------+-------+  Summary: Right: Resting right ankle-brachial index is within normal range. The right toe-brachial index is normal. Left: Resting left ankle-brachial index is within normal range. The left toe-brachial index is normal. *See table(s) above for measurements and observations.  Electronically signed by [name] MD on 08/03/2022 at 1:08:27 PM.    Final        Assessment & Plan:   1. Atherosclerosis of native artery of left Taylor extremity with ulceration, unspecified ulceration site [facility]) Recommend:  The patient is status post successful intervention.  The patient reports that the claudication symptoms and leg pain has improved.   The patient denies lifestyle limiting changes at this point in time.  No further  invasive studies, angiography or surgery at this time The patient should continue walking and begin a more formal exercise program.  The patient should continue antiplatelet therapy and aggressive treatment of the lipid abnormalities  Continued surveillance is indicated as atherosclerosis is likely to progress with time.    Patient should undergo noninvasive studies as ordered. The patient will follow up with me to review the studies.     2. Type 2 diabetes mellitus without complication, with long-term current use of insulin ([place]) Continue hypoglycemic medications as already ordered, these medications have been reviewed and there are no changes at this time.  Hgb A1C to be monitored as already arranged by primary service    Current Outpatient Medications on File Prior to Visit  Medication Sig Dispense Refill   acetaminophen (TYLENOL) 325 MG tablet Take 650 mg by mouth every 6 (six) hours as needed for mild pain.     albuterol (VENTOLIN HFA) 108 (90 Base) MCG/ACT inhaler Inhale 1-2 puffs into the lungs every 6 (six) hours as needed for wheezing or shortness of breath.     atorvastatin (LIPITOR) 10 MG tablet Take 1 tablet (10 mg total) by mouth daily. 90 tablet 0   b complex vitamins tablet Take 1 tablet by mouth daily.     BD PEN NEEDLE MICRO U/F 32G X 6 MM MISC SMARTSIG:1 SUB-Q 5 Times Daily     Blood Glucose Monitoring Suppl (ONE TOUCH ULTRA 2) w/Device KIT SMARTSIG:1 Kit(s) Via Meter Every Morning     buPROPion (WELLBUTRIN SR) 150 MG 12 hr tablet Take 150 mg by mouth 2 (two) times daily.     cabergoline (DOSTINEX) 0.5 MG tablet Take 0.5 mg by mouth 3 (three) times a week.     cetirizine (ZYRTEC) 10 MG tablet Take 10 mg by mouth daily.     clopidogrel (PLAVIX) 75 MG tablet Take 1 tablet (75 mg total) by mouth daily. 90 tablet 0   cyclobenzaprine (FLEXERIL) 10 MG tablet Take 10 mg by mouth 3 (three) times daily as needed for muscle spasms.     docusate sodium (COLACE) 100 MG capsule Take  1 capsule (100 mg total) by mouth daily. 10 capsule 0   ezetimibe (ZETIA) 10 MG tablet Take 10 mg by mouth  daily.     fluticasone furoate-vilanterol (BREO ELLIPTA) 100-25 MCG/ACT AEPB Inhale into the lungs.     gabapentin (NEURONTIN) 800 MG tablet Take 0.5 tablets (400 mg total) by mouth 2 (two) times daily.     HUMALOG KWIKPEN 100 UNIT/ML KwikPen Inject 0-5 Units into the skin 3 (three) times daily.     ipratropium-albuterol (DUONEB) 0.5-2.5 (3) MG/3ML SOLN Inhale 3 mLs into the lungs every 4 (four) hours as needed (wheezing/shortness of breath).     LANTUS SOLOSTAR 100 UNIT/ML Solostar Pen Inject 25 Units into the skin daily at 12 noon. 15 mL 11   levothyroxine (SYNTHROID) 125 MCG tablet Take 125 mcg by mouth daily before breakfast.     omeprazole (PRILOSEC) 20 MG capsule Take 20 mg by mouth daily as needed (for acid reflux).     ONETOUCH ULTRA test strip USE TO CHECK BLOOD SUGAR 3 TIMES A DAY     oxyCODONE-acetaminophen (PERCOCET/ROXICET) 5-325 MG tablet Take 2 tablets by mouth every 6 (six) hours as needed for severe pain or moderate pain. (Patient taking differently: Take 1 tablet by mouth every 6 (six) hours as needed for severe pain or moderate pain.) 10 tablet 0   OXYGEN 2 L.     predniSONE (DELTASONE) 10 MG tablet 4 tabs po daily for two days. 3 tabs po daily for 2 days; 2 tabs po daily for two days then one tab po daily 42 tablet 0   PREMARIN vaginal cream Place 0.5 g vaginally 2 (two) times a week.     spironolactone (ALDACTONE) 25 MG tablet Take 25 mg by mouth daily.     ULTICARE MINI PEN NEEDLES 31G X 6 MM MISC USE 5 TIMES A DAY AS DIRECTED     umeclidinium bromide (INCRUSE ELLIPTA) 62.5 MCG/ACT AEPB Inhale into the lungs.     varenicline (CHANTIX PAK) 0.5 MG X 11 & 1 MG X 42 tablet SMARTSIG:Box(s) By Mouth As Directed     Vitamin D, Ergocalciferol, (DRISDOL) 1.25 MG (50000 UNIT) CAPS capsule Take 50,000 Units by mouth 2 (two) times a week.     furosemide (LASIX) 40 MG tablet Take 40  mg by mouth daily. (Patient not taking: Reported on 08/31/2022)     Multiple Vitamins-Minerals (OCUVITE EYE HEATLH GUMMIES PO) Take 2 each by mouth daily. (Patient not taking: Reported on 08/31/2022)     No current facility-administered medications on file prior to visit.    There are no Patient Instructions on file for this visit. No follow-ups on file.   Kris Hartmann, NP

## 2022-09-13 ENCOUNTER — Encounter (INDEPENDENT_AMBULATORY_CARE_PROVIDER_SITE_OTHER): Payer: Self-pay

## 2022-11-15 DIAGNOSIS — I5043 Acute on chronic combined systolic (congestive) and diastolic (congestive) heart failure: Secondary | ICD-10-CM | POA: Diagnosis not present

## 2022-11-15 DIAGNOSIS — A419 Sepsis, unspecified organism: Secondary | ICD-10-CM | POA: Diagnosis not present

## 2022-11-15 DIAGNOSIS — T82856A Stenosis of peripheral vascular stent, initial encounter: Secondary | ICD-10-CM | POA: Diagnosis not present

## 2022-11-15 DIAGNOSIS — N170 Acute kidney failure with tubular necrosis: Secondary | ICD-10-CM | POA: Diagnosis not present

## 2022-11-16 DIAGNOSIS — K7200 Acute and subacute hepatic failure without coma: Secondary | ICD-10-CM | POA: Diagnosis not present

## 2022-11-16 DIAGNOSIS — E875 Hyperkalemia: Secondary | ICD-10-CM | POA: Diagnosis not present

## 2022-11-16 DIAGNOSIS — N170 Acute kidney failure with tubular necrosis: Secondary | ICD-10-CM | POA: Diagnosis not present

## 2022-11-16 DIAGNOSIS — J9621 Acute and chronic respiratory failure with hypoxia: Secondary | ICD-10-CM | POA: Diagnosis not present

## 2022-11-16 DIAGNOSIS — J9622 Acute and chronic respiratory failure with hypercapnia: Secondary | ICD-10-CM | POA: Diagnosis not present

## 2022-11-16 DIAGNOSIS — N179 Acute kidney failure, unspecified: Secondary | ICD-10-CM | POA: Diagnosis not present

## 2022-11-16 DIAGNOSIS — I509 Heart failure, unspecified: Secondary | ICD-10-CM | POA: Diagnosis not present

## 2022-11-16 DIAGNOSIS — I5043 Acute on chronic combined systolic (congestive) and diastolic (congestive) heart failure: Secondary | ICD-10-CM | POA: Diagnosis not present

## 2022-11-16 DIAGNOSIS — T82856A Stenosis of peripheral vascular stent, initial encounter: Secondary | ICD-10-CM | POA: Diagnosis not present

## 2022-11-16 DIAGNOSIS — R6521 Severe sepsis with septic shock: Secondary | ICD-10-CM | POA: Diagnosis not present

## 2022-11-16 DIAGNOSIS — J9601 Acute respiratory failure with hypoxia: Secondary | ICD-10-CM | POA: Diagnosis not present

## 2022-11-16 DIAGNOSIS — N10 Acute tubulo-interstitial nephritis: Secondary | ICD-10-CM | POA: Diagnosis not present

## 2022-11-16 DIAGNOSIS — Z4682 Encounter for fitting and adjustment of non-vascular catheter: Secondary | ICD-10-CM | POA: Diagnosis not present

## 2022-11-16 DIAGNOSIS — E8721 Acute metabolic acidosis: Secondary | ICD-10-CM | POA: Diagnosis not present

## 2022-11-16 DIAGNOSIS — A419 Sepsis, unspecified organism: Secondary | ICD-10-CM | POA: Diagnosis not present

## 2022-11-16 DIAGNOSIS — I998 Other disorder of circulatory system: Secondary | ICD-10-CM | POA: Diagnosis not present

## 2022-11-16 DIAGNOSIS — I7409 Other arterial embolism and thrombosis of abdominal aorta: Secondary | ICD-10-CM | POA: Diagnosis not present

## 2022-11-16 DIAGNOSIS — E871 Hypo-osmolality and hyponatremia: Secondary | ICD-10-CM | POA: Diagnosis not present

## 2022-11-16 DIAGNOSIS — J439 Emphysema, unspecified: Secondary | ICD-10-CM | POA: Diagnosis not present

## 2022-11-16 DIAGNOSIS — E46 Unspecified protein-calorie malnutrition: Secondary | ICD-10-CM | POA: Diagnosis not present

## 2022-11-16 DIAGNOSIS — R402A Nontraumatic coma due to underlying condition: Secondary | ICD-10-CM | POA: Diagnosis not present

## 2022-11-16 DIAGNOSIS — I700 Atherosclerosis of aorta: Secondary | ICD-10-CM | POA: Diagnosis not present

## 2022-11-16 DIAGNOSIS — I63312 Cerebral infarction due to thrombosis of left middle cerebral artery: Secondary | ICD-10-CM | POA: Diagnosis not present

## 2022-11-16 DIAGNOSIS — Z720 Tobacco use: Secondary | ICD-10-CM | POA: Diagnosis not present

## 2022-11-16 DIAGNOSIS — J449 Chronic obstructive pulmonary disease, unspecified: Secondary | ICD-10-CM | POA: Diagnosis not present

## 2022-11-16 DIAGNOSIS — J9600 Acute respiratory failure, unspecified whether with hypoxia or hypercapnia: Secondary | ICD-10-CM | POA: Diagnosis not present

## 2022-11-16 DIAGNOSIS — I110 Hypertensive heart disease with heart failure: Secondary | ICD-10-CM | POA: Diagnosis not present

## 2022-11-16 DIAGNOSIS — I2510 Atherosclerotic heart disease of native coronary artery without angina pectoris: Secondary | ICD-10-CM | POA: Diagnosis not present

## 2022-11-17 DIAGNOSIS — R7401 Elevation of levels of liver transaminase levels: Secondary | ICD-10-CM | POA: Diagnosis not present

## 2022-11-17 DIAGNOSIS — T82856A Stenosis of peripheral vascular stent, initial encounter: Secondary | ICD-10-CM | POA: Diagnosis not present

## 2022-11-17 DIAGNOSIS — N10 Acute tubulo-interstitial nephritis: Secondary | ICD-10-CM | POA: Diagnosis not present

## 2022-11-17 DIAGNOSIS — A419 Sepsis, unspecified organism: Secondary | ICD-10-CM | POA: Diagnosis not present

## 2022-11-17 DIAGNOSIS — R918 Other nonspecific abnormal finding of lung field: Secondary | ICD-10-CM | POA: Diagnosis not present

## 2022-11-17 DIAGNOSIS — J441 Chronic obstructive pulmonary disease with (acute) exacerbation: Secondary | ICD-10-CM | POA: Diagnosis not present

## 2022-11-17 DIAGNOSIS — J9621 Acute and chronic respiratory failure with hypoxia: Secondary | ICD-10-CM | POA: Diagnosis not present

## 2022-11-17 DIAGNOSIS — D649 Anemia, unspecified: Secondary | ICD-10-CM | POA: Diagnosis not present

## 2022-11-17 DIAGNOSIS — N170 Acute kidney failure with tubular necrosis: Secondary | ICD-10-CM | POA: Diagnosis not present

## 2022-11-17 DIAGNOSIS — J9600 Acute respiratory failure, unspecified whether with hypoxia or hypercapnia: Secondary | ICD-10-CM | POA: Diagnosis not present

## 2022-11-17 DIAGNOSIS — Z9911 Dependence on respirator [ventilator] status: Secondary | ICD-10-CM | POA: Diagnosis not present

## 2022-11-17 DIAGNOSIS — I5043 Acute on chronic combined systolic (congestive) and diastolic (congestive) heart failure: Secondary | ICD-10-CM | POA: Diagnosis not present

## 2022-11-17 DIAGNOSIS — I739 Peripheral vascular disease, unspecified: Secondary | ICD-10-CM | POA: Diagnosis not present

## 2022-11-17 DIAGNOSIS — R579 Shock, unspecified: Secondary | ICD-10-CM | POA: Diagnosis not present

## 2022-11-17 DIAGNOSIS — N179 Acute kidney failure, unspecified: Secondary | ICD-10-CM | POA: Diagnosis not present

## 2022-11-17 DIAGNOSIS — J90 Pleural effusion, not elsewhere classified: Secondary | ICD-10-CM | POA: Diagnosis not present

## 2022-11-18 DIAGNOSIS — J9621 Acute and chronic respiratory failure with hypoxia: Secondary | ICD-10-CM | POA: Diagnosis not present

## 2022-11-18 DIAGNOSIS — I998 Other disorder of circulatory system: Secondary | ICD-10-CM | POA: Diagnosis not present

## 2022-11-18 DIAGNOSIS — J439 Emphysema, unspecified: Secondary | ICD-10-CM | POA: Diagnosis not present

## 2022-11-18 DIAGNOSIS — N10 Acute tubulo-interstitial nephritis: Secondary | ICD-10-CM | POA: Diagnosis not present

## 2022-11-18 DIAGNOSIS — A419 Sepsis, unspecified organism: Secondary | ICD-10-CM | POA: Diagnosis not present

## 2022-11-18 DIAGNOSIS — J9600 Acute respiratory failure, unspecified whether with hypoxia or hypercapnia: Secondary | ICD-10-CM | POA: Diagnosis not present

## 2022-11-18 DIAGNOSIS — I739 Peripheral vascular disease, unspecified: Secondary | ICD-10-CM | POA: Diagnosis not present

## 2022-11-18 DIAGNOSIS — I5043 Acute on chronic combined systolic (congestive) and diastolic (congestive) heart failure: Secondary | ICD-10-CM | POA: Diagnosis not present

## 2022-11-18 DIAGNOSIS — T82856A Stenosis of peripheral vascular stent, initial encounter: Secondary | ICD-10-CM | POA: Diagnosis not present

## 2022-11-18 DIAGNOSIS — J441 Chronic obstructive pulmonary disease with (acute) exacerbation: Secondary | ICD-10-CM | POA: Diagnosis not present

## 2022-11-18 DIAGNOSIS — N179 Acute kidney failure, unspecified: Secondary | ICD-10-CM | POA: Diagnosis not present

## 2022-11-18 DIAGNOSIS — J449 Chronic obstructive pulmonary disease, unspecified: Secondary | ICD-10-CM | POA: Diagnosis not present

## 2022-11-18 DIAGNOSIS — N170 Acute kidney failure with tubular necrosis: Secondary | ICD-10-CM | POA: Diagnosis not present

## 2022-11-18 DIAGNOSIS — R579 Shock, unspecified: Secondary | ICD-10-CM | POA: Diagnosis not present

## 2022-11-18 DIAGNOSIS — Z9911 Dependence on respirator [ventilator] status: Secondary | ICD-10-CM | POA: Diagnosis not present

## 2022-11-19 DIAGNOSIS — K5900 Constipation, unspecified: Secondary | ICD-10-CM | POA: Diagnosis not present

## 2022-11-19 DIAGNOSIS — I739 Peripheral vascular disease, unspecified: Secondary | ICD-10-CM | POA: Diagnosis not present

## 2022-11-19 DIAGNOSIS — N179 Acute kidney failure, unspecified: Secondary | ICD-10-CM | POA: Diagnosis not present

## 2022-11-19 DIAGNOSIS — A419 Sepsis, unspecified organism: Secondary | ICD-10-CM | POA: Diagnosis not present

## 2022-11-19 DIAGNOSIS — D72829 Elevated white blood cell count, unspecified: Secondary | ICD-10-CM | POA: Diagnosis not present

## 2022-11-19 DIAGNOSIS — N170 Acute kidney failure with tubular necrosis: Secondary | ICD-10-CM | POA: Diagnosis not present

## 2022-11-19 DIAGNOSIS — R6521 Severe sepsis with septic shock: Secondary | ICD-10-CM | POA: Diagnosis not present

## 2022-11-19 DIAGNOSIS — J441 Chronic obstructive pulmonary disease with (acute) exacerbation: Secondary | ICD-10-CM | POA: Diagnosis not present

## 2022-11-19 DIAGNOSIS — J9600 Acute respiratory failure, unspecified whether with hypoxia or hypercapnia: Secondary | ICD-10-CM | POA: Diagnosis not present

## 2022-11-19 DIAGNOSIS — R7989 Other specified abnormal findings of blood chemistry: Secondary | ICD-10-CM | POA: Diagnosis not present

## 2022-11-19 DIAGNOSIS — D649 Anemia, unspecified: Secondary | ICD-10-CM | POA: Diagnosis not present

## 2022-11-19 DIAGNOSIS — I5043 Acute on chronic combined systolic (congestive) and diastolic (congestive) heart failure: Secondary | ICD-10-CM | POA: Diagnosis not present

## 2022-11-19 DIAGNOSIS — N10 Acute tubulo-interstitial nephritis: Secondary | ICD-10-CM | POA: Diagnosis not present

## 2022-11-19 DIAGNOSIS — R579 Shock, unspecified: Secondary | ICD-10-CM | POA: Diagnosis not present

## 2022-11-19 DIAGNOSIS — J449 Chronic obstructive pulmonary disease, unspecified: Secondary | ICD-10-CM | POA: Diagnosis not present

## 2022-11-19 DIAGNOSIS — R570 Cardiogenic shock: Secondary | ICD-10-CM | POA: Diagnosis not present

## 2022-11-19 DIAGNOSIS — I998 Other disorder of circulatory system: Secondary | ICD-10-CM | POA: Diagnosis not present

## 2022-11-19 DIAGNOSIS — T82856A Stenosis of peripheral vascular stent, initial encounter: Secondary | ICD-10-CM | POA: Diagnosis not present

## 2022-11-19 DIAGNOSIS — J9621 Acute and chronic respiratory failure with hypoxia: Secondary | ICD-10-CM | POA: Diagnosis not present

## 2022-11-19 DIAGNOSIS — Z9911 Dependence on respirator [ventilator] status: Secondary | ICD-10-CM | POA: Diagnosis not present

## 2022-11-20 DIAGNOSIS — J441 Chronic obstructive pulmonary disease with (acute) exacerbation: Secondary | ICD-10-CM | POA: Diagnosis not present

## 2022-11-20 DIAGNOSIS — T82856A Stenosis of peripheral vascular stent, initial encounter: Secondary | ICD-10-CM | POA: Diagnosis not present

## 2022-11-20 DIAGNOSIS — I5043 Acute on chronic combined systolic (congestive) and diastolic (congestive) heart failure: Secondary | ICD-10-CM | POA: Diagnosis not present

## 2022-11-20 DIAGNOSIS — N179 Acute kidney failure, unspecified: Secondary | ICD-10-CM | POA: Diagnosis not present

## 2022-11-20 DIAGNOSIS — N10 Acute tubulo-interstitial nephritis: Secondary | ICD-10-CM | POA: Diagnosis not present

## 2022-11-20 DIAGNOSIS — A419 Sepsis, unspecified organism: Secondary | ICD-10-CM | POA: Diagnosis not present

## 2022-11-20 DIAGNOSIS — N170 Acute kidney failure with tubular necrosis: Secondary | ICD-10-CM | POA: Diagnosis not present

## 2022-11-20 DIAGNOSIS — Z9911 Dependence on respirator [ventilator] status: Secondary | ICD-10-CM | POA: Diagnosis not present

## 2022-11-20 DIAGNOSIS — R7989 Other specified abnormal findings of blood chemistry: Secondary | ICD-10-CM | POA: Diagnosis not present

## 2022-11-20 DIAGNOSIS — D649 Anemia, unspecified: Secondary | ICD-10-CM | POA: Diagnosis not present

## 2022-11-20 DIAGNOSIS — N1330 Unspecified hydronephrosis: Secondary | ICD-10-CM | POA: Diagnosis not present

## 2022-11-20 DIAGNOSIS — I739 Peripheral vascular disease, unspecified: Secondary | ICD-10-CM | POA: Diagnosis not present

## 2022-11-20 DIAGNOSIS — R579 Shock, unspecified: Secondary | ICD-10-CM | POA: Diagnosis not present

## 2022-11-20 DIAGNOSIS — J9601 Acute respiratory failure with hypoxia: Secondary | ICD-10-CM | POA: Diagnosis not present

## 2022-11-20 DIAGNOSIS — J9600 Acute respiratory failure, unspecified whether with hypoxia or hypercapnia: Secondary | ICD-10-CM | POA: Diagnosis not present

## 2022-11-20 DIAGNOSIS — R9341 Abnormal radiologic findings on diagnostic imaging of renal pelvis, ureter, or bladder: Secondary | ICD-10-CM | POA: Diagnosis not present

## 2022-11-21 DIAGNOSIS — I998 Other disorder of circulatory system: Secondary | ICD-10-CM | POA: Diagnosis not present

## 2022-11-21 DIAGNOSIS — N179 Acute kidney failure, unspecified: Secondary | ICD-10-CM | POA: Diagnosis not present

## 2022-11-21 DIAGNOSIS — N170 Acute kidney failure with tubular necrosis: Secondary | ICD-10-CM | POA: Diagnosis not present

## 2022-11-21 DIAGNOSIS — G935 Compression of brain: Secondary | ICD-10-CM | POA: Diagnosis not present

## 2022-11-21 DIAGNOSIS — Z9911 Dependence on respirator [ventilator] status: Secondary | ICD-10-CM | POA: Diagnosis not present

## 2022-11-21 DIAGNOSIS — T82856A Stenosis of peripheral vascular stent, initial encounter: Secondary | ICD-10-CM | POA: Diagnosis not present

## 2022-11-21 DIAGNOSIS — I739 Peripheral vascular disease, unspecified: Secondary | ICD-10-CM | POA: Diagnosis not present

## 2022-11-21 DIAGNOSIS — I639 Cerebral infarction, unspecified: Secondary | ICD-10-CM | POA: Diagnosis not present

## 2022-11-21 DIAGNOSIS — I63512 Cerebral infarction due to unspecified occlusion or stenosis of left middle cerebral artery: Secondary | ICD-10-CM | POA: Diagnosis not present

## 2022-11-21 DIAGNOSIS — I5043 Acute on chronic combined systolic (congestive) and diastolic (congestive) heart failure: Secondary | ICD-10-CM | POA: Diagnosis not present

## 2022-11-21 DIAGNOSIS — J9601 Acute respiratory failure with hypoxia: Secondary | ICD-10-CM | POA: Diagnosis not present

## 2022-11-21 DIAGNOSIS — J9600 Acute respiratory failure, unspecified whether with hypoxia or hypercapnia: Secondary | ICD-10-CM | POA: Diagnosis not present

## 2022-11-21 DIAGNOSIS — J441 Chronic obstructive pulmonary disease with (acute) exacerbation: Secondary | ICD-10-CM | POA: Diagnosis not present

## 2022-11-21 DIAGNOSIS — A419 Sepsis, unspecified organism: Secondary | ICD-10-CM | POA: Diagnosis not present

## 2022-11-21 DIAGNOSIS — R579 Shock, unspecified: Secondary | ICD-10-CM | POA: Diagnosis not present

## 2022-11-22 DIAGNOSIS — Z515 Encounter for palliative care: Secondary | ICD-10-CM | POA: Diagnosis not present

## 2022-11-22 DIAGNOSIS — I998 Other disorder of circulatory system: Secondary | ICD-10-CM | POA: Diagnosis not present

## 2022-11-22 DIAGNOSIS — J9602 Acute respiratory failure with hypercapnia: Secondary | ICD-10-CM | POA: Diagnosis not present

## 2022-11-22 DIAGNOSIS — I63512 Cerebral infarction due to unspecified occlusion or stenosis of left middle cerebral artery: Secondary | ICD-10-CM | POA: Diagnosis not present

## 2022-11-22 DIAGNOSIS — N170 Acute kidney failure with tubular necrosis: Secondary | ICD-10-CM | POA: Diagnosis not present

## 2022-11-22 DIAGNOSIS — J449 Chronic obstructive pulmonary disease, unspecified: Secondary | ICD-10-CM | POA: Diagnosis not present

## 2022-11-22 DIAGNOSIS — J9622 Acute and chronic respiratory failure with hypercapnia: Secondary | ICD-10-CM | POA: Diagnosis not present

## 2022-11-22 DIAGNOSIS — T82856A Stenosis of peripheral vascular stent, initial encounter: Secondary | ICD-10-CM | POA: Diagnosis not present

## 2022-11-22 DIAGNOSIS — J441 Chronic obstructive pulmonary disease with (acute) exacerbation: Secondary | ICD-10-CM | POA: Diagnosis not present

## 2022-11-22 DIAGNOSIS — I7409 Other arterial embolism and thrombosis of abdominal aorta: Secondary | ICD-10-CM | POA: Diagnosis not present

## 2022-11-22 DIAGNOSIS — J9621 Acute and chronic respiratory failure with hypoxia: Secondary | ICD-10-CM | POA: Diagnosis not present

## 2022-11-22 DIAGNOSIS — N10 Acute tubulo-interstitial nephritis: Secondary | ICD-10-CM | POA: Diagnosis not present

## 2022-11-22 DIAGNOSIS — G9340 Encephalopathy, unspecified: Secondary | ICD-10-CM | POA: Diagnosis not present

## 2022-11-22 DIAGNOSIS — I639 Cerebral infarction, unspecified: Secondary | ICD-10-CM | POA: Diagnosis not present

## 2022-11-22 DIAGNOSIS — I5043 Acute on chronic combined systolic (congestive) and diastolic (congestive) heart failure: Secondary | ICD-10-CM | POA: Diagnosis not present

## 2022-11-22 DIAGNOSIS — Z7901 Long term (current) use of anticoagulants: Secondary | ICD-10-CM | POA: Diagnosis not present

## 2022-11-22 DIAGNOSIS — R570 Cardiogenic shock: Secondary | ICD-10-CM | POA: Diagnosis not present

## 2022-11-22 DIAGNOSIS — A419 Sepsis, unspecified organism: Secondary | ICD-10-CM | POA: Diagnosis not present

## 2022-11-22 DIAGNOSIS — J9601 Acute respiratory failure with hypoxia: Secondary | ICD-10-CM | POA: Diagnosis not present

## 2022-11-22 DIAGNOSIS — N179 Acute kidney failure, unspecified: Secondary | ICD-10-CM | POA: Diagnosis not present

## 2022-11-22 DIAGNOSIS — R6521 Severe sepsis with septic shock: Secondary | ICD-10-CM | POA: Diagnosis not present

## 2022-11-23 DIAGNOSIS — I7409 Other arterial embolism and thrombosis of abdominal aorta: Secondary | ICD-10-CM | POA: Diagnosis not present

## 2022-11-23 DIAGNOSIS — N10 Acute tubulo-interstitial nephritis: Secondary | ICD-10-CM | POA: Diagnosis not present

## 2022-11-23 DIAGNOSIS — J9601 Acute respiratory failure with hypoxia: Secondary | ICD-10-CM | POA: Diagnosis not present

## 2022-11-23 DIAGNOSIS — A419 Sepsis, unspecified organism: Secondary | ICD-10-CM | POA: Diagnosis not present

## 2022-11-23 DIAGNOSIS — N179 Acute kidney failure, unspecified: Secondary | ICD-10-CM | POA: Diagnosis not present

## 2022-11-23 DIAGNOSIS — R6521 Severe sepsis with septic shock: Secondary | ICD-10-CM | POA: Diagnosis not present

## 2022-11-23 DIAGNOSIS — N170 Acute kidney failure with tubular necrosis: Secondary | ICD-10-CM | POA: Diagnosis not present

## 2022-11-23 DIAGNOSIS — I998 Other disorder of circulatory system: Secondary | ICD-10-CM | POA: Diagnosis not present

## 2022-11-23 DIAGNOSIS — T82856A Stenosis of peripheral vascular stent, initial encounter: Secondary | ICD-10-CM | POA: Diagnosis not present

## 2022-11-23 DIAGNOSIS — I5043 Acute on chronic combined systolic (congestive) and diastolic (congestive) heart failure: Secondary | ICD-10-CM | POA: Diagnosis not present

## 2022-11-23 DIAGNOSIS — R570 Cardiogenic shock: Secondary | ICD-10-CM | POA: Diagnosis not present

## 2022-11-24 ENCOUNTER — Other Ambulatory Visit: Payer: Self-pay

## 2022-11-24 ENCOUNTER — Other Ambulatory Visit (INDEPENDENT_AMBULATORY_CARE_PROVIDER_SITE_OTHER): Payer: Self-pay | Admitting: Nurse Practitioner

## 2022-11-24 DIAGNOSIS — A419 Sepsis, unspecified organism: Secondary | ICD-10-CM | POA: Diagnosis not present

## 2022-11-24 DIAGNOSIS — N170 Acute kidney failure with tubular necrosis: Secondary | ICD-10-CM | POA: Diagnosis not present

## 2022-11-24 DIAGNOSIS — T82856A Stenosis of peripheral vascular stent, initial encounter: Secondary | ICD-10-CM | POA: Diagnosis not present

## 2022-11-24 DIAGNOSIS — J9601 Acute respiratory failure with hypoxia: Secondary | ICD-10-CM | POA: Diagnosis not present

## 2022-11-24 DIAGNOSIS — I5043 Acute on chronic combined systolic (congestive) and diastolic (congestive) heart failure: Secondary | ICD-10-CM | POA: Diagnosis not present

## 2022-11-24 DIAGNOSIS — I739 Peripheral vascular disease, unspecified: Secondary | ICD-10-CM

## 2022-11-24 DIAGNOSIS — N179 Acute kidney failure, unspecified: Secondary | ICD-10-CM | POA: Diagnosis not present

## 2022-11-30 ENCOUNTER — Ambulatory Visit (INDEPENDENT_AMBULATORY_CARE_PROVIDER_SITE_OTHER): Payer: Medicare Other | Admitting: Vascular Surgery

## 2022-11-30 ENCOUNTER — Encounter (INDEPENDENT_AMBULATORY_CARE_PROVIDER_SITE_OTHER): Payer: Medicare Other

## 2022-12-16 DEATH — deceased

## 2023-01-05 ENCOUNTER — Encounter: Payer: Self-pay | Admitting: Vascular Surgery
